# Patient Record
Sex: Male | Born: 1970 | Race: Black or African American | Hispanic: No | Marital: Single | Smoking: Current every day smoker
Health system: Southern US, Community
[De-identification: ages and names within clinical notes are randomized; demographics above are authoritative.]

## PROBLEM LIST (undated history)

## (undated) DIAGNOSIS — I5042 Chronic combined systolic (congestive) and diastolic (congestive) heart failure: Secondary | ICD-10-CM

## (undated) DIAGNOSIS — I10 Essential (primary) hypertension: Secondary | ICD-10-CM

## (undated) DIAGNOSIS — I2510 Atherosclerotic heart disease of native coronary artery without angina pectoris: Secondary | ICD-10-CM

## (undated) DIAGNOSIS — F1410 Cocaine abuse, uncomplicated: Secondary | ICD-10-CM

## (undated) DIAGNOSIS — Z95820 Peripheral vascular angioplasty status with implants and grafts: Secondary | ICD-10-CM

## (undated) DIAGNOSIS — F1110 Opioid abuse, uncomplicated: Secondary | ICD-10-CM

---

## 2001-03-05 ENCOUNTER — Emergency Department (HOSPITAL_COMMUNITY): Admission: EM | Admit: 2001-03-05 | Discharge: 2001-03-05 | Payer: Self-pay | Admitting: *Deleted

## 2001-07-09 ENCOUNTER — Encounter: Payer: Self-pay | Admitting: Emergency Medicine

## 2001-07-09 ENCOUNTER — Emergency Department (HOSPITAL_COMMUNITY): Admission: EM | Admit: 2001-07-09 | Discharge: 2001-07-09 | Payer: Self-pay | Admitting: Emergency Medicine

## 2002-01-11 ENCOUNTER — Emergency Department (HOSPITAL_COMMUNITY): Admission: EM | Admit: 2002-01-11 | Discharge: 2002-01-11 | Payer: Self-pay | Admitting: Emergency Medicine

## 2010-04-04 ENCOUNTER — Emergency Department (HOSPITAL_COMMUNITY): Admission: EM | Admit: 2010-04-04 | Discharge: 2010-04-04 | Payer: Self-pay | Admitting: Emergency Medicine

## 2020-10-12 ENCOUNTER — Inpatient Hospital Stay (HOSPITAL_COMMUNITY)
Admission: EM | Admit: 2020-10-12 | Discharge: 2020-10-15 | DRG: 981 | Disposition: A | Discharge status: Home / Self Care | Payer: Self-pay | Attending: Interventional Cardiology | Admitting: Interventional Cardiology

## 2020-10-12 ENCOUNTER — Emergency Department (HOSPITAL_COMMUNITY): Payer: Self-pay

## 2020-10-12 DIAGNOSIS — I25110 Atherosclerotic heart disease of native coronary artery with unstable angina pectoris: Secondary | ICD-10-CM

## 2020-10-12 DIAGNOSIS — I5041 Acute combined systolic (congestive) and diastolic (congestive) heart failure: Secondary | ICD-10-CM | POA: Diagnosis present

## 2020-10-12 DIAGNOSIS — Z881 Allergy status to other antibiotic agents status: Secondary | ICD-10-CM

## 2020-10-12 DIAGNOSIS — F1410 Cocaine abuse, uncomplicated: Secondary | ICD-10-CM | POA: Diagnosis present

## 2020-10-12 DIAGNOSIS — I110 Hypertensive heart disease with heart failure: Secondary | ICD-10-CM | POA: Diagnosis present

## 2020-10-12 DIAGNOSIS — Z955 Presence of coronary angioplasty implant and graft: Secondary | ICD-10-CM

## 2020-10-12 DIAGNOSIS — I2510 Atherosclerotic heart disease of native coronary artery without angina pectoris: Secondary | ICD-10-CM | POA: Diagnosis present

## 2020-10-12 DIAGNOSIS — Z720 Tobacco use: Secondary | ICD-10-CM

## 2020-10-12 DIAGNOSIS — I213 ST elevation (STEMI) myocardial infarction of unspecified site: Secondary | ICD-10-CM

## 2020-10-12 DIAGNOSIS — E785 Hyperlipidemia, unspecified: Secondary | ICD-10-CM | POA: Diagnosis present

## 2020-10-12 DIAGNOSIS — Z20822 Contact with and (suspected) exposure to covid-19: Secondary | ICD-10-CM | POA: Diagnosis present

## 2020-10-12 DIAGNOSIS — E7800 Pure hypercholesterolemia, unspecified: Secondary | ICD-10-CM | POA: Diagnosis present

## 2020-10-12 DIAGNOSIS — T405X1A Poisoning by cocaine, accidental (unintentional), initial encounter: Principal | ICD-10-CM | POA: Diagnosis present

## 2020-10-12 DIAGNOSIS — I2102 ST elevation (STEMI) myocardial infarction involving left anterior descending coronary artery: Secondary | ICD-10-CM | POA: Diagnosis present

## 2020-10-12 MED ORDER — ASPIRIN 81 MG PO CHEW
324.0000 mg | CHEWABLE_TABLET | Freq: Once | ORAL | Status: AC
  Administered 2020-10-13: 324 mg via ORAL
  Filled 2020-10-12: qty 4

## 2020-10-12 MED ORDER — SODIUM CHLORIDE 0.9 % IV SOLN: INTRAVENOUS | Status: DC

## 2020-10-12 MED ORDER — NITROGLYCERIN 0.4 MG SL SUBL
0.4000 mg | SUBLINGUAL_TABLET | SUBLINGUAL | Status: DC | PRN
  Administered 2020-10-13 (×2): 0.4 mg via SUBLINGUAL
  Filled 2020-10-12 (×2): qty 1

## 2020-10-13 ENCOUNTER — Other Ambulatory Visit: Payer: Self-pay

## 2020-10-13 ENCOUNTER — Inpatient Hospital Stay (HOSPITAL_COMMUNITY): Payer: Self-pay

## 2020-10-13 ENCOUNTER — Encounter (HOSPITAL_COMMUNITY): Payer: Self-pay | Admitting: Interventional Cardiology

## 2020-10-13 ENCOUNTER — Other Ambulatory Visit (HOSPITAL_COMMUNITY): Payer: Self-pay

## 2020-10-13 ENCOUNTER — Encounter (HOSPITAL_COMMUNITY)
Admission: EM | Disposition: A | Discharge status: Home / Self Care | Payer: Self-pay | Source: Home / Self Care | Attending: Interventional Cardiology

## 2020-10-13 DIAGNOSIS — I2102 ST elevation (STEMI) myocardial infarction involving left anterior descending coronary artery: Secondary | ICD-10-CM | POA: Diagnosis present

## 2020-10-13 DIAGNOSIS — I213 ST elevation (STEMI) myocardial infarction of unspecified site: Secondary | ICD-10-CM

## 2020-10-13 DIAGNOSIS — I5041 Acute combined systolic (congestive) and diastolic (congestive) heart failure: Secondary | ICD-10-CM

## 2020-10-13 DIAGNOSIS — I25110 Atherosclerotic heart disease of native coronary artery with unstable angina pectoris: Secondary | ICD-10-CM

## 2020-10-13 HISTORY — PX: LEFT HEART CATH AND CORONARY ANGIOGRAPHY: CATH118249

## 2020-10-13 HISTORY — DX: ST elevation (STEMI) myocardial infarction of unspecified site: I21.3

## 2020-10-13 HISTORY — PX: CORONARY/GRAFT ACUTE MI REVASCULARIZATION: CATH118305

## 2020-10-13 LAB — CBC
HCT: 36.6 % — ABNORMAL LOW (ref 39.0–52.0)
Hemoglobin: 12.1 g/dL — ABNORMAL LOW (ref 13.0–17.0)
MCH: 30.9 pg (ref 26.0–34.0)
MCHC: 33.1 g/dL (ref 30.0–36.0)
MCV: 93.6 fL (ref 80.0–100.0)
Platelets: 213 10*3/uL (ref 150–400)
RBC: 3.91 MIL/uL — ABNORMAL LOW (ref 4.22–5.81)
RDW: 12 % (ref 11.5–15.5)
WBC: 5.2 10*3/uL (ref 4.0–10.5)
nRBC: 0 % (ref 0.0–0.2)

## 2020-10-13 LAB — CBC WITH DIFFERENTIAL/PLATELET
Abs Immature Granulocytes: 0 10*3/uL (ref 0.00–0.07)
Basophils Absolute: 0 10*3/uL (ref 0.0–0.1)
Basophils Relative: 0 %
Eosinophils Absolute: 0.1 10*3/uL (ref 0.0–0.5)
Eosinophils Relative: 2 %
HCT: 41.1 % (ref 39.0–52.0)
Hemoglobin: 13.1 g/dL (ref 13.0–17.0)
Lymphocytes Relative: 34 %
Lymphs Abs: 1.4 10*3/uL (ref 0.7–4.0)
MCH: 30.5 pg (ref 26.0–34.0)
MCHC: 31.9 g/dL (ref 30.0–36.0)
MCV: 95.8 fL (ref 80.0–100.0)
Monocytes Absolute: 0.2 10*3/uL (ref 0.1–1.0)
Monocytes Relative: 6 %
Neutro Abs: 2.4 10*3/uL (ref 1.7–7.7)
Neutrophils Relative %: 58 %
Platelets: 231 10*3/uL (ref 150–400)
RBC: 4.29 MIL/uL (ref 4.22–5.81)
RDW: 12 % (ref 11.5–15.5)
WBC: 4.1 10*3/uL (ref 4.0–10.5)
nRBC: 0 % (ref 0.0–0.2)
nRBC: 0 /100 WBC

## 2020-10-13 LAB — POCT ACTIVATED CLOTTING TIME
Activated Clotting Time: 260 seconds
Activated Clotting Time: 439 seconds

## 2020-10-13 LAB — BASIC METABOLIC PANEL
Anion gap: 6 (ref 5–15)
BUN: 10 mg/dL (ref 6–20)
CO2: 28 mmol/L (ref 22–32)
Calcium: 8.8 mg/dL — ABNORMAL LOW (ref 8.9–10.3)
Chloride: 102 mmol/L (ref 98–111)
Creatinine, Ser: 0.74 mg/dL (ref 0.61–1.24)
GFR, Estimated: >60 mL/min (ref >60)
Glucose, Bld: 118 mg/dL — ABNORMAL HIGH (ref 70–99)
Potassium: 3.4 mmol/L — ABNORMAL LOW (ref 3.5–5.1)
Sodium: 136 mmol/L (ref 135–145)

## 2020-10-13 LAB — COMPREHENSIVE METABOLIC PANEL
ALT: 12 U/L (ref 0–44)
AST: 40 U/L (ref 15–41)
Albumin: 3.7 g/dL (ref 3.5–5.0)
Alkaline Phosphatase: 56 U/L (ref 38–126)
Anion gap: 10 (ref 5–15)
BUN: 13 mg/dL (ref 6–20)
CO2: 26 mmol/L (ref 22–32)
Calcium: 9 mg/dL (ref 8.9–10.3)
Chloride: 103 mmol/L (ref 98–111)
Creatinine, Ser: 0.74 mg/dL (ref 0.61–1.24)
GFR, Estimated: >60 mL/min (ref >60)
Glucose, Bld: 97 mg/dL (ref 70–99)
Potassium: 3.6 mmol/L (ref 3.5–5.1)
Sodium: 139 mmol/L (ref 135–145)
Total Bilirubin: 0.5 mg/dL (ref 0.3–1.2)
Total Protein: 7 g/dL (ref 6.5–8.1)

## 2020-10-13 LAB — PROTIME-INR
INR: 0.9 (ref 0.8–1.2)
Prothrombin Time: 11.8 seconds (ref 11.4–15.2)

## 2020-10-13 LAB — HEMOGLOBIN A1C
Hgb A1c MFr Bld: 5.8 % — ABNORMAL HIGH (ref 4.8–5.6)
Mean Plasma Glucose: 120 mg/dL

## 2020-10-13 LAB — ETHANOL: Alcohol, Ethyl (B): <10 mg/dL (ref <10)

## 2020-10-13 LAB — BRAIN NATRIURETIC PEPTIDE: B Natriuretic Peptide: 139.1 pg/mL — ABNORMAL HIGH (ref 0.0–100.0)

## 2020-10-13 LAB — TROPONIN I (HIGH SENSITIVITY)
Troponin I (High Sensitivity): 2817 ng/L (ref <18)
Troponin I (High Sensitivity): >24000 ng/L (ref <18)
Troponin I (High Sensitivity): >24000 ng/L (ref <18)
Troponin I (High Sensitivity): >24000 ng/L (ref <18)
Troponin I (High Sensitivity): >24000 ng/L (ref <18)

## 2020-10-13 LAB — ECHOCARDIOGRAM COMPLETE
Area-P 1/2: 2.93 cm2
S' Lateral: 2.8 cm

## 2020-10-13 LAB — LIPID PANEL
Cholesterol: 159 mg/dL (ref 0–200)
HDL: 44 mg/dL (ref >40)
LDL Cholesterol: 98 mg/dL (ref 0–99)
Total CHOL/HDL Ratio: 3.6 RATIO
Triglycerides: 87 mg/dL (ref <150)
VLDL: 17 mg/dL (ref 0–40)

## 2020-10-13 LAB — RESP PANEL BY RT-PCR (FLU A&B, COVID) ARPGX2
Influenza A by PCR: NEGATIVE
Influenza B by PCR: NEGATIVE
SARS Coronavirus 2 by RT PCR: NEGATIVE

## 2020-10-13 LAB — HEPARIN LEVEL (UNFRACTIONATED): Heparin Unfractionated: <0.1 IU/mL — ABNORMAL LOW (ref 0.30–0.70)

## 2020-10-13 LAB — MRSA PCR SCREENING: MRSA by PCR: NEGATIVE

## 2020-10-13 LAB — APTT: aPTT: 27 seconds (ref 24–36)

## 2020-10-13 LAB — GLUCOSE, CAPILLARY: Glucose-Capillary: 105 mg/dL — ABNORMAL HIGH (ref 70–99)

## 2020-10-13 SURGERY — CORONARY/GRAFT ACUTE MI REVASCULARIZATION
Anesthesia: LOCAL

## 2020-10-13 MED ORDER — TIROFIBAN HCL IN NACL 5-0.9 MG/100ML-% IV SOLN
INTRAVENOUS | Status: AC
  Filled 2020-10-13: qty 100

## 2020-10-13 MED ORDER — HEPARIN (PORCINE) 25000 UT/250ML-% IV SOLN
1350.0000 [IU]/h | INTRAVENOUS | Status: DC
  Administered 2020-10-13: 1000 [IU]/h via INTRAVENOUS
  Filled 2020-10-13: qty 250

## 2020-10-13 MED ORDER — ONDANSETRON HCL 4 MG/2ML IJ SOLN: 4.0000 mg | Freq: Four times a day (QID) | INTRAMUSCULAR | Status: DC | PRN

## 2020-10-13 MED ORDER — HEPARIN SODIUM (PORCINE) 1000 UNIT/ML IJ SOLN
INTRAMUSCULAR | Status: AC
  Filled 2020-10-13: qty 1

## 2020-10-13 MED ORDER — HEPARIN SODIUM (PORCINE) 5000 UNIT/ML IJ SOLN: 4000.0000 [IU] | Freq: Once | INTRAMUSCULAR | Status: DC

## 2020-10-13 MED ORDER — NITROGLYCERIN IN D5W 200-5 MCG/ML-% IV SOLN
20.0000 ug/min | INTRAVENOUS | Status: DC
  Administered 2020-10-13: 20 ug/min via INTRAVENOUS
  Filled 2020-10-13: qty 250

## 2020-10-13 MED ORDER — NITROGLYCERIN 1 MG/10 ML FOR IR/CATH LAB
INTRA_ARTERIAL | Status: AC
  Filled 2020-10-13: qty 10

## 2020-10-13 MED ORDER — CHLORHEXIDINE GLUCONATE CLOTH 2 % EX PADS
6.0000 | MEDICATED_PAD | Freq: Every day | CUTANEOUS | Status: DC
  Administered 2020-10-14: 6 via TOPICAL

## 2020-10-13 MED ORDER — ATORVASTATIN CALCIUM 80 MG PO TABS
80.0000 mg | ORAL_TABLET | Freq: Every day | ORAL | Status: DC
  Administered 2020-10-13 – 2020-10-15 (×3): 80 mg via ORAL
  Filled 2020-10-13 (×3): qty 1

## 2020-10-13 MED ORDER — FUROSEMIDE 10 MG/ML IJ SOLN
20.0000 mg | Freq: Once | INTRAMUSCULAR | Status: AC
  Administered 2020-10-13: 20 mg via INTRAVENOUS
  Filled 2020-10-13: qty 2

## 2020-10-13 MED ORDER — OXYCODONE HCL 5 MG PO TABS
5.0000 mg | ORAL_TABLET | ORAL | Status: DC | PRN
  Administered 2020-10-14 – 2020-10-15 (×3): 5 mg via ORAL
  Filled 2020-10-13 (×3): qty 1

## 2020-10-13 MED ORDER — NITROGLYCERIN 1 MG/10 ML FOR IR/CATH LAB
INTRA_ARTERIAL | Status: DC | PRN
  Administered 2020-10-13: 200 ug via INTRACORONARY

## 2020-10-13 MED ORDER — TICAGRELOR 90 MG PO TABS
90.0000 mg | ORAL_TABLET | Freq: Two times a day (BID) | ORAL | Status: DC
  Administered 2020-10-13 (×2): 90 mg via ORAL
  Filled 2020-10-13 (×2): qty 1

## 2020-10-13 MED ORDER — VERAPAMIL HCL 2.5 MG/ML IV SOLN
INTRAVENOUS | Status: AC
  Filled 2020-10-13: qty 2

## 2020-10-13 MED ORDER — HEPARIN (PORCINE) IN NACL 1000-0.9 UT/500ML-% IV SOLN
INTRAVENOUS | Status: AC
  Filled 2020-10-13: qty 1500

## 2020-10-13 MED ORDER — LOSARTAN POTASSIUM 25 MG PO TABS
25.0000 mg | ORAL_TABLET | Freq: Every day | ORAL | Status: DC
  Administered 2020-10-13: 25 mg via ORAL
  Filled 2020-10-13: qty 1

## 2020-10-13 MED ORDER — SODIUM CHLORIDE 0.9 % IV SOLN: 250.0000 mL | INTRAVENOUS | Status: DC | PRN

## 2020-10-13 MED ORDER — ASPIRIN 81 MG PO CHEW
81.0000 mg | CHEWABLE_TABLET | Freq: Every day | ORAL | Status: DC
  Administered 2020-10-13 – 2020-10-15 (×3): 81 mg via ORAL
  Filled 2020-10-13 (×3): qty 1

## 2020-10-13 MED ORDER — HEPARIN SODIUM (PORCINE) 1000 UNIT/ML IJ SOLN
INTRAMUSCULAR | Status: DC | PRN
  Administered 2020-10-13: 7000 [IU] via INTRAVENOUS
  Administered 2020-10-13: 3000 [IU] via INTRAVENOUS

## 2020-10-13 MED ORDER — TICAGRELOR 90 MG PO TABS
ORAL_TABLET | ORAL | Status: DC | PRN
  Administered 2020-10-13: 180 mg via ORAL

## 2020-10-13 MED ORDER — TIROFIBAN (AGGRASTAT) BOLUS VIA INFUSION
INTRAVENOUS | Status: DC | PRN
  Administered 2020-10-13: 1925 ug via INTRAVENOUS

## 2020-10-13 MED ORDER — LIDOCAINE HCL (PF) 1 % IJ SOLN
INTRAMUSCULAR | Status: AC
  Filled 2020-10-13: qty 30

## 2020-10-13 MED ORDER — HYDRALAZINE HCL 20 MG/ML IJ SOLN: 10.0000 mg | INTRAMUSCULAR | Status: AC | PRN

## 2020-10-13 MED ORDER — FENTANYL CITRATE (PF) 100 MCG/2ML IJ SOLN
INTRAMUSCULAR | Status: DC | PRN
  Administered 2020-10-13: 25 ug via INTRAVENOUS

## 2020-10-13 MED ORDER — ACETAMINOPHEN 325 MG PO TABS: 650.0000 mg | ORAL_TABLET | ORAL | Status: DC | PRN

## 2020-10-13 MED ORDER — TICAGRELOR 90 MG PO TABS
ORAL_TABLET | ORAL | Status: AC
  Filled 2020-10-13: qty 2

## 2020-10-13 MED ORDER — TIROFIBAN HCL IN NACL 5-0.9 MG/100ML-% IV SOLN
INTRAVENOUS | Status: DC | PRN
  Administered 2020-10-13: 0.15 ug/kg/min via INTRAVENOUS

## 2020-10-13 MED ORDER — MIDAZOLAM HCL 2 MG/2ML IJ SOLN
INTRAMUSCULAR | Status: AC
  Filled 2020-10-13: qty 2

## 2020-10-13 MED ORDER — SODIUM CHLORIDE 0.9 % IV SOLN: INTRAVENOUS | Status: AC

## 2020-10-13 MED ORDER — LIDOCAINE HCL (PF) 1 % IJ SOLN
INTRAMUSCULAR | Status: DC | PRN
  Administered 2020-10-13: 2 mL via INTRADERMAL

## 2020-10-13 MED ORDER — PERFLUTREN LIPID MICROSPHERE
1.0000 mL | INTRAVENOUS | Status: AC | PRN
  Administered 2020-10-13: 3 mL via INTRAVENOUS
  Filled 2020-10-13: qty 10

## 2020-10-13 MED ORDER — POTASSIUM CHLORIDE CRYS ER 20 MEQ PO TBCR
40.0000 meq | EXTENDED_RELEASE_TABLET | Freq: Once | ORAL | Status: AC
  Administered 2020-10-13: 40 meq via ORAL
  Filled 2020-10-13: qty 2

## 2020-10-13 MED ORDER — HEPARIN (PORCINE) IN NACL 1000-0.9 UT/500ML-% IV SOLN
INTRAVENOUS | Status: DC | PRN
  Administered 2020-10-13 (×3): 500 mL

## 2020-10-13 MED ORDER — MIDAZOLAM HCL 2 MG/2ML IJ SOLN
INTRAMUSCULAR | Status: DC | PRN
  Administered 2020-10-13: 0.5 mg via INTRAVENOUS

## 2020-10-13 MED ORDER — LABETALOL HCL 5 MG/ML IV SOLN: 10.0000 mg | INTRAVENOUS | Status: AC | PRN

## 2020-10-13 MED ORDER — FUROSEMIDE 10 MG/ML IJ SOLN
INTRAMUSCULAR | Status: AC
  Filled 2020-10-13: qty 2

## 2020-10-13 MED ORDER — VERAPAMIL HCL 2.5 MG/ML IV SOLN
INTRAVENOUS | Status: DC | PRN
  Administered 2020-10-13: 10 mL via INTRA_ARTERIAL

## 2020-10-13 MED ORDER — FENTANYL CITRATE (PF) 100 MCG/2ML IJ SOLN
INTRAMUSCULAR | Status: AC
  Filled 2020-10-13: qty 2

## 2020-10-13 MED ORDER — SODIUM CHLORIDE 0.9% FLUSH
3.0000 mL | Freq: Two times a day (BID) | INTRAVENOUS | Status: DC
  Administered 2020-10-13 – 2020-10-15 (×4): 3 mL via INTRAVENOUS

## 2020-10-13 MED ORDER — SODIUM CHLORIDE 0.9% FLUSH: 3.0000 mL | INTRAVENOUS | Status: DC | PRN

## 2020-10-13 MED ORDER — CARVEDILOL 3.125 MG PO TABS
3.1250 mg | ORAL_TABLET | Freq: Two times a day (BID) | ORAL | Status: DC
  Administered 2020-10-13 (×2): 3.125 mg via ORAL
  Filled 2020-10-13 (×2): qty 1

## 2020-10-13 SURGICAL SUPPLY — 18 items
BALLN SAPPHIRE 2.5X15 (BALLOONS) ×2
BALLN SAPPHIRE ~~LOC~~ 3.25X8 (BALLOONS) ×1 IMPLANT
BALLN SAPPHIRE ~~LOC~~ 3.5X15 (BALLOONS) ×1 IMPLANT
BALLOON SAPPHIRE 2.5X15 (BALLOONS) IMPLANT
CATH 5FR JL3.5 JR4 ANG PIG MP (CATHETERS) ×1 IMPLANT
CATH VISTA GUIDE 6FR XBLAD3.5 (CATHETERS) ×1 IMPLANT
DEVICE RAD COMP TR BAND LRG (VASCULAR PRODUCTS) ×1 IMPLANT
GLIDESHEATH SLEND A-KIT 6F 22G (SHEATH) ×1 IMPLANT
GUIDEWIRE INQWIRE 1.5J.035X260 (WIRE) IMPLANT
INQWIRE 1.5J .035X260CM (WIRE) ×2
KIT ENCORE 26 ADVANTAGE (KITS) ×1 IMPLANT
KIT HEART LEFT (KITS) ×2 IMPLANT
PACK CARDIAC CATHETERIZATION (CUSTOM PROCEDURE TRAY) ×2 IMPLANT
SHEATH PROBE COVER 6X72 (BAG) ×1 IMPLANT
STENT RESOLUTE ONYX 2.75X18 (Permanent Stent) ×1 IMPLANT
TRANSDUCER W/STOPCOCK (MISCELLANEOUS) ×2 IMPLANT
TUBING CIL FLEX 10 FLL-RA (TUBING) ×2 IMPLANT
WIRE ASAHI PROWATER 180CM (WIRE) ×1 IMPLANT

## 2020-10-13 NOTE — Plan of Care (Signed)

## 2020-10-13 NOTE — Progress Notes (Signed)
   10/13/20 0000  Clinical Encounter Type  Visited With Patient not available  Visit Type Trauma  Referral From Nurse  Consult/Referral To Chaplain  The chaplain responded to page. The patient is being assessed in Trauma A. The patient has no family present. No chaplain services are needed at this time. The chaplain will follow up as needed.

## 2020-10-13 NOTE — H&P (Signed)
INTERVENTIONAL CARDIOLOGY NOTE  50 year old gentleman with chest and back pain since earlier this morning, greater than 18 hours prior to admission.  Associated history of cocaine use.  No prior history of MI.  Ongoing 9 out of 10 pain on presentation.  Prior smoker.  Not diabetic.  No prior history of coronary disease.  Denies hypertension and is unaware of lipid status.  On exam the patient is lying in bed with a flat affect, eyes closed, and is uncommunicative until vigorously stimulated by loud vocal interrogation.  Responses are short.  JVD is not noted.  Cardiac exam reveals an S4 gallop.  Lungs are clear to auscultation and percussion.  Pulses are symmetrical at the carotids, radials, femorals, and posterior tibials bilaterally.  The abdomen is soft.  As mentioned above the neuro exam is flat affect but no focal deficits.  EKG abnormal with Q waves V3 through V5, I, and aVL with minimal inferior lead ST depression.  Right axis deviation is noted.  Chest x-ray reveals atelectasis but no evidence of failure.  CONCLUSIONS: Acute coronary syndrome and probable late presenting myocardial infarction with chest pain continuous since early this morning, greater than 18 hours ago.  No evidence of hemodynamic compromise or heart failure.  Cocaine use prior to onset of chest discomfort.  RECOMMENDATIONS: The patient presented as a "STEMI activation".  He does have ongoing chest discomfort.  EKG suggests Q wave infarction of uncertain age involving the anterolateral left ventricle.  Emergency catheterization is being performed.  EKG does not demonstrate STEMI criteria but ongoing chest pain and evidence of Q wave infarction is concerning.  Coronary angiography was discussed with the patient including approach, risks of stroke, death, myocardial infarction, emergency surgery, bleeding, limb ischemia, kidney injury.  Critical care time 45 minutes

## 2020-10-13 NOTE — Progress Notes (Addendum)
Heart Failure Stewardship Pharmacist Progress Note   PCP: No primary care provider on file. PCP-Cardiologist: None    HPI:  72 yoM w/ PMH of cocaine and tobacco use presenting late w/ CP on 6/8, evaluated for STEMI. Cath revealed 100% occlusion of LAD, DES placed, EF estimated 30-35% in cath, LVEDP 24. ECHO revealed EF 42% with G1DD and normal RV systolic function.  Current HF Medications: - Losartan 25 mg daily - Carvedilol 3.125 mg BID - Lasix 20 mg IV x1  Prior to admission HF Medications: - N/A  Pertinent Lab Values: Serum creatinine 0.74, BUN 10, Potassium 3.4, Sodium 136, BNP 139  Vital Signs: Weight: N/A Blood pressure: 140/90s  Heart rate: 60-70s  Medication Assistance / Insurance Benefits Check: Does the patient have prescription insurance?  No  Does the patient qualify for medication assistance through manufacturers or grants?   Yes Eligible grants and/or patient assistance programs: pending Medication assistance applications in progress: N/A  Medication assistance applications approved: N/A Approved medication assistance renewals will be completed by: pending  Outpatient Pharmacy:  Prior to admission outpatient pharmacy: [facility] Is the patient willing to use [facility] TOC pharmacy at discharge? Yes Is the patient willing to transition their outpatient pharmacy to utilize a [facility] outpatient pharmacy?   Pending    Assessment: 1. New onset CHF (EF 42%), due to ischemic CM (potential hx cocaine/tobacco use). NYHA class II symptoms. - Continue carvedilol 3.125mg  BID - Continue losartan 25mg  daily, w/ elevated BP consider optimizing to Entresto - Consider adding spironolactone - Consider adding Farxiga 10 mg daily prior to discharge   Plan: 1) Medication changes recommended at this time: - Optimize losartan to Entresto or add spironolactone tomorrow  2) Patient assistance: - No insurance - can utilize free 30 day cards at [facility] pharmacy and then can complete  long-term patient assistance applications for [place], Brilinta, and Farxiga at HF TOC follow up. This will allow copays to be $0 per month for each medication once approved. - HF TOC appt 6/20  3)  Education  - To be completed prior to discharge  7/20 Josie Dixon) Caydan Mctavish, PharmD Student

## 2020-10-13 NOTE — Progress Notes (Signed)
ANTICOAGULATION CONSULT NOTE - Initial Consult  Pharmacy Consult for Heparin  Indication: chest pain/ACS, s/p cath, high suspicion for apical thrombus  Allergies  Allergen Reactions  . Keflex [Cephalexin]       Vital Signs: Temp: 99.4 F (37.4 C) (06/08 1603) Temp Source: Oral (06/08 1603) BP: 134/94 (06/08 1800) Pulse Rate: 66 (06/08 1000)  Labs: Recent Labs    10/12/20 2348 10/13/20 0315 10/13/20 0653 10/13/20 0956 10/13/20 1658  HGB 13.1 12.1*  --   --   --   HCT 41.1 36.6*  --   --   --   PLT 231 213  --   --   --   APTT 27  --   --   --   --   LABPROT 11.8  --   --   --   --   INR 0.9  --   --   --   --   HEPARINUNFRC  --   --   --   --  <0.10*  CREATININE 0.74 0.74  --   --   --   TROPONINIHS 2,817* >24,000*  >24,000* >24,000* >24,000*  --     CrCl cannot be calculated (Unknown ideal weight.).   Medical History: History reviewed. No pertinent past medical history.   Assessment: 50 y/o M CODE STEMI s/p stenting of the mid LAD. High suspicion for apical thrombus. Starting heparin 8 hours after sheath removal. Sheath removed at 0142. CBC good. Renal function good.   Heparin level this evening undetectable on 1000 units/hr.  No overt bleeding or complications noted.  Goal of Therapy:  Heparin level 0.3-0.7 units/ml Monitor platelets by anticoagulation protocol: Yes   Plan:  Increase IV heparin to 1200 units/hr. Repeat heparin level in 8 hrs. Daily CBC/heparin level Monitor for bleeding F/U ECHO   Reece Leader, Colon Flattery, [facility] Clinical Pharmacist  10/13/2020 7:11 PM   [facility] pharmacy phone numbers are listed on [email or web address]

## 2020-10-13 NOTE — Progress Notes (Signed)
ANTICOAGULATION CONSULT NOTE - Initial Consult  Pharmacy Consult for Heparin  Indication: chest pain/ACS, s/p cath, high suspicion for apical thrombus  Allergies  Allergen Reactions  . Keflex [Cephalexin]       Vital Signs: Temp: 99.2 F (37.3 C) (06/08 0220) Temp Source: Oral (06/08 0220) BP: 145/82 (06/08 0330) Pulse Rate: 67 (06/08 0330)  Labs: Recent Labs    10/12/20 2348 10/13/20 0315  HGB 13.1 12.1*  HCT 41.1 36.6*  PLT 231 213  APTT 27  --   LABPROT 11.8  --   INR 0.9  --   CREATININE 0.74  --   TROPONINIHS 2,817*  --     CrCl cannot be calculated (Unknown ideal weight.).   Medical History: No past medical history on file.   Assessment: 50 y/o M CODE STEMI s/p stenting of the mid LAD. High suspicion for apical thrombus. Starting heparin 8 hours after sheath removal. Sheath removed at 0142. CBC good. Renal function good.   Goal of Therapy:  Heparin level 0.3-0.7 units/ml Monitor platelets by anticoagulation protocol: Yes   Plan:  No bolus s/p cath Start heparin drip at 1000 units/hr at 0930 1700 heparin level Daily CBC/heparin level Monitor for bleeding F/U ECHO  Abran Duke, PharmD, BCPS Clinical Pharmacist Phone: [phone]

## 2020-10-13 NOTE — TOC Benefit Eligibility Note (Signed)
Patient Advocate Encounter  Insurance verification completed.    The patient is uninsured  Jewelle Whitner, CPhT Pharmacy Patient Advocate Specialist [place] Antimicrobial Stewardship Team Direct Number: [phone]  Fax: [phone]        

## 2020-10-13 NOTE — Progress Notes (Signed)
Progress Note  Patient Name: Christopher Gibson Date of Encounter: 10/13/2020  [facility] Cardiologist: None new- Verdis Prime MD  Subjective   Patient reports feeling better. No chest pain or SOB. Not very communicative.   Inpatient Medications    Scheduled Meds: . aspirin  81 mg Oral Daily  . atorvastatin  80 mg Oral Daily  . sodium chloride flush  3 mL Intravenous Q12H  . ticagrelor  90 mg Oral BID   Continuous Infusions: . sodium chloride    . heparin    . nitroGLYCERIN Stopped (10/13/20 0408)   PRN Meds: sodium chloride, acetaminophen, nitroGLYCERIN, ondansetron (ZOFRAN) IV, oxyCODONE, sodium chloride flush   Vital Signs    Vitals:   10/13/20 0700 10/13/20 0715 10/13/20 0730 10/13/20 0738  BP: (!) 151/94 (!) 133/91 (!) 137/91   Pulse: 70 68 64   Resp: 14 (!) 22 (!) 24   Temp:    99.6 F (37.6 C)  TempSrc:    Oral  SpO2: 100% 99% 99%     Intake/Output Summary (Last 24 hours) at 10/13/2020 0915 Last data filed at 10/13/2020 0500 Gross per 24 hour  Intake 450.23 ml  Output 600 ml  Net -149.77 ml   No flowsheet data found.    Telemetry    NSR with PVCs, occ triplets - Personally Reviewed  ECG    NSR, anterior infarct with Q waves V1-5 and T wave inversion - Personally Reviewed  Physical Exam   GEN: No acute distress. Depressed affect.  Multiple tattoos. Neck: No JVD Cardiac: RRR, no murmurs, rubs, or gallops.  Respiratory: Clear to auscultation bilaterally. GI: Soft, nontender, non-distended  MS: No edema; No deformity. No radial site hematoma Neuro:  Nonfocal  Psych: Normal affect   Labs    High Sensitivity Troponin:   Recent Labs  Lab 10/12/20 2348 10/13/20 0315 10/13/20 0653  TROPONINIHS 2,817* >24,000*  >24,000* >24,000*      Chemistry Recent Labs  Lab 10/12/20 2348 10/13/20 0315  NA 139 136  K 3.6 3.4*  CL 103 102  CO2 26 28  GLUCOSE 97 118*  BUN 13 10  CREATININE 0.74 0.74  CALCIUM 9.0 8.8*  PROT 7.0  --   ALBUMIN 3.7   --   AST 40  --   ALT 12  --   ALKPHOS 56  --   BILITOT 0.5  --   GFRNONAA >60 >60  ANIONGAP 10 6     Hematology Recent Labs  Lab 10/12/20 2348 10/13/20 0315  WBC 4.1 5.2  RBC 4.29 3.91*  HGB 13.1 12.1*  HCT 41.1 36.6*  MCV 95.8 93.6  MCH 30.5 30.9  MCHC 31.9 33.1  RDW 12.0 12.0  PLT 231 213    BNP Recent Labs  Lab 10/13/20 0315  BNP 139.1*     DDimer No results for input(s): DDIMER in the last 168 hours.   Radiology    CARDIAC CATHETERIZATION  Result Date: 10/13/2020  A stent was successfully placed.   Late presenting anterior infarction due to occlusion of the mid LAD.  Successful stenting of the mid LAD using an 18 x 2.5 Onyx postdilated to 3.5 mm in diameter with TIMI grade III flow.  Widely patent left main  Diffuse luminal irregularities in the proximal and mid LAD  Diffuse mild to moderate nonobstructive atherosclerosis in the circumflex.  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  EF 30  to 35%.  LVEDP 24 mmHg.  Acute systolic heart failure based upon hemodynamics and wall motion. RECOMMENDATIONS:  Aggressive risk factor modification with high intensity statin therapy, screening for diabetes, blood pressure control.  IV nitroglycerin for blood pressure control and to decrease LVEDP.  She did run for 12 to 24 hours  Start ARB and beta-blocker therapy as tolerated by blood pressure and clinical status  2D Doppler echocardiogram to assess for apical thrombus.  IV heparin will be restarted because of high index of suspicion of apical thrombus.  Further management per team   DG Chest Port 1 View  Result Date: 10/13/2020 CLINICAL DATA:  Chest pain. EXAM: PORTABLE CHEST 1 VIEW COMPARISON:  None. FINDINGS: The heart size and mediastinal contours are within normal limits. Streaky airspace opacity within the left lung likely represents atelectasis. No focal  consolidation. No pulmonary edema. No pleural effusion. No pneumothorax. No acute osseous abnormality. IMPRESSION: 1. Streaky airspace opacity within the left lung likely represents atelectasis. Followup PA and lateral chest X-ray is recommended in 3-4 weeks to ensure resolution. 2. Otherwise no acute cardiopulmonary abnormality. Electronically Signed   By: Tish Frederickson M.D.   On: 10/13/2020 00:21    Cardiac Studies   LEFT HEART CATH AND CORONARY ANGIOGRAPHY    Conclusion    A stent was successfully placed.    Late presenting anterior infarction due to occlusion of the mid LAD.  Successful stenting of the mid LAD using an 18 x 2.5 Onyx postdilated to 3.5 mm in diameter with TIMI grade III flow.  Widely patent left main  Diffuse luminal irregularities in the proximal and mid LAD  Diffuse mild to moderate nonobstructive atherosclerosis in the circumflex.  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  EF 30 to 35%.  LVEDP 24 mmHg.  Acute systolic heart failure based upon hemodynamics and wall motion.  RECOMMENDATIONS:   Aggressive risk factor modification with high intensity statin therapy, screening for diabetes, blood pressure control.  IV nitroglycerin for blood pressure control and to decrease LVEDP.  She did run for 12 to 24 hours  Start ARB and beta-blocker therapy as tolerated by blood pressure and clinical status  2D Doppler echocardiogram to assess for apical thrombus.  IV heparin will be restarted because of high index of suspicion of apical thrombus.  Further management per team  Diagnostic Dominance: Right    Intervention     Implants       Patient Profile     50 y.o. male with history of active cocaine and tobacco abuse presents late in the course of acute anterior STEMI  Assessment & Plan    1. Acute anterior STEMI with late  presentation. Onset of chest pain 18 hours prior to presentation. Q waves anteriorly. Troponin > 24K. On cath has anteroapical dyskinesis.  -s/p DES of mid LAD -DAPT for 1 year (Aspirin and Ticag) -high doseAtorvastatin -Needs Echo- assess for possible LV thrombus. If no thrombus will change heparin to DVT prophylaxis.  -Cocaine and smoking cessation -Start Coreg and ACE-I today.   2. Acute systolic CHF. -LVEDP 24- will give IV lasix x 1. -check Echo.  -start Coreg and ARB.  -Aggressive risk factor management  3. Tobacco abuse. Counseled on importance of cessation.   4. Cocaine abuse.Counseled on importance of cessation.   5. Hypercholesterolemia. LDL 98. On high dose statin  For questions or updates, please contact [facility] Please consult  [email or web address] for contact info under        Signed, Jocilyn Trego [place], MD  10/13/2020, 9:15 AM

## 2020-10-13 NOTE — H&P (Signed)
Cardiology Admission History and Physical:   Patient ID: Christopher Gibson MRN: [id number]; DOB: 1970/10/31   Admission date: 10/12/2020  PCP:  No primary care provider on file.   [facility] Providers Cardiologist:  None        Chief Complaint:  Chest pain  Patient Profile:   Christopher Gibson is a 50 y.o. male with no significant pmh sx who is being seen 10/13/2020 for the evaluation of STEMI  History of Present Illness:   Christopher Gibson is a 50 y.o. male with no significant pmh sx who is being seen 10/13/2020 for the evaluation of STEMI. Has history of cocaine abuse. Did some cocaine this morning after which he had chest pressure and pain. He waited it out; then ultimately came to the ED this evening. EKG was concerning for STEMI; chest pain was ongoing. Hence STEMI was activated from the ED. No hx of DM or HTN. Troponin were very high in the ED. Mild SOB. Smokes regularly. Nitro and Aspirin were given in the ED>   PMH: None PSH: None  Medications Prior to Admission: Prior to Admission medications   Not on File     Allergies:    Allergies  Allergen Reactions  . Keflex [Cephalexin]     Social History:   Social History   Socioeconomic History  . Marital status: Single    Spouse name: Not on file  . Number of children: Not on file  . Years of education: Not on file  . Highest education level: Not on file  Occupational History  . Not on file  Tobacco Use  . Smoking status: Not on file  . Smokeless tobacco: Not on file  Substance and Sexual Activity  . Alcohol use: Not on file  . Drug use: Not on file  . Sexual activity: Not on file  Other Topics Concern  . Not on file  Social History Narrative  . Not on file   Social Determinants of Health   Financial Resource Strain: Not on file  Food Insecurity: Not on file  Transportation Needs: Not on file  Physical Activity: Not on file  Stress: Not on file  Social Connections: Not on file  Intimate Partner Violence: Not on  file    Family History:   No sudden cardiac deaths in family  ROS:  Please see the history of present illness.  All other ROS reviewed and negative.     Physical Exam/Data:   Vitals:   10/13/20 0245 10/13/20 0300 10/13/20 0315 10/13/20 0330  BP: (!) 161/93 (!) 134/100 134/86 (!) 145/82  Pulse: 94 70 63 67  Resp: (!) 21 13 19 18   Temp:      TempSrc:      SpO2: 97% 98% 99% 97%    Intake/Output Summary (Last 24 hours) at 10/13/2020 0342 Last data filed at 10/13/2020 0300 Gross per 24 hour  Intake 37.37 ml  Output 300 ml  Net -262.63 ml   No flowsheet data found.   There is no height or weight on file to calculate BMI.  General:  Mild distress due to CP HEENT: normal Lymph: no adenopathy Neck: no JVD Endocrine:  No thryomegaly Vascular: No carotid bruits; FA pulses 2+ bilaterally without bruits  Cardiac:  normal S1, S2; RRR; no murmur. S4 present Lungs:  clear to auscultation bilaterally, no wheezing, rhonchi or rales  Abd: soft, nontender, no hepatomegaly  Ext: no edema Musculoskeletal:  No deformities, BUE and BLE strength normal and equal Skin:  warm and dry  Neuro:  CNs 2-12 intact, no focal abnormalities noted Psych:  Normal affect   EKG:  The ECG that was done  was personally reviewed and demonstrates Q waves and ST changes in I, AVL  Relevant CV Studies: None  Laboratory Data:  High Sensitivity Troponin:   Recent Labs  Lab 10/12/20 2348  TROPONINIHS 2,817*      Chemistry Recent Labs  Lab 10/12/20 2348  NA 139  K 3.6  CL 103  CO2 26  GLUCOSE 97  BUN 13  CREATININE 0.74  CALCIUM 9.0  GFRNONAA >60  ANIONGAP 10    Recent Labs  Lab 10/12/20 2348  PROT 7.0  ALBUMIN 3.7  AST 40  ALT 12  ALKPHOS 56  BILITOT 0.5   Hematology Recent Labs  Lab 10/12/20 2348 10/13/20 0315  WBC 4.1 5.2  RBC 4.29 3.91*  HGB 13.1 12.1*  HCT 41.1 36.6*  MCV 95.8 93.6  MCH 30.5 30.9  MCHC 31.9 33.1  RDW 12.0 12.0  PLT 231 213   BNPNo results for  input(s): BNP, PROBNP in the last 168 hours.  DDimer No results for input(s): DDIMER in the last 168 hours.   Radiology/Studies:  CARDIAC CATHETERIZATION  Result Date: 10/13/2020  A stent was successfully placed.   Late presenting anterior infarction due to occlusion of the mid LAD.  Successful stenting of the mid LAD using an 18 x 2.5 Onyx postdilated to 3.5 mm in diameter with TIMI grade III flow.  Widely patent left main  Diffuse luminal irregularities in the proximal and mid LAD  Diffuse mild to moderate nonobstructive atherosclerosis in the circumflex.  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  EF 30 to 35%.  LVEDP 24 mmHg.  Acute systolic heart failure based upon hemodynamics and wall motion. RECOMMENDATIONS:  Aggressive risk factor modification with high intensity statin therapy, screening for diabetes, blood pressure control.  IV nitroglycerin for blood pressure control and to decrease LVEDP.  She did run for 12 to 24 hours  Start ARB and beta-blocker therapy as tolerated by blood pressure and clinical status  2D Doppler echocardiogram to assess for apical thrombus.  IV heparin will be restarted because of high index of suspicion of apical thrombus.  Further management per team   DG Chest Port 1 View  Result Date: 10/13/2020 CLINICAL DATA:  Chest pain. EXAM: PORTABLE CHEST 1 VIEW COMPARISON:  None. FINDINGS: The heart size and mediastinal contours are within normal limits. Streaky airspace opacity within the left lung likely represents atelectasis. No focal consolidation. No pulmonary edema. No pleural effusion. No pneumothorax. No acute osseous abnormality. IMPRESSION: 1. Streaky airspace opacity within the left lung likely represents atelectasis. Followup PA and lateral chest X-ray is recommended in 3-4 weeks to ensure resolution. 2. Otherwise no acute cardiopulmonary abnormality.  Electronically Signed   By: Tish Frederickson M.D.   On: 10/13/2020 00:21     Assessment and Plan:   # STEMI -Cath lab activated. Stent placed in mid LAD -DAPT for 1 year (Aspirin and Ticag) -Check Lipid panel -Atorvastatin -Heparin given in the ER -Needs Echo -Cocaine and smoking cessation -Start BB and ACE-I when tolerated.   # Apical Thrombus and High LVEDP -LVEDP 24 -Formal Echo in morning -Heparin for AC -Aggressive risk factor management   Severity of Illness: The appropriate patient status for this patient is INPATIENT. Inpatient status is judged to be reasonable and  necessary in order to provide the required intensity of service to ensure the patient's safety. The patient's presenting symptoms, physical exam findings, and initial radiographic and laboratory data in the context of their chronic comorbidities is felt to place them at high risk for further clinical deterioration. Furthermore, it is not anticipated that the patient will be medically stable for discharge from the hospital within 2 midnights of admission. The following factors support the patient status of inpatient.   The patient's presenting symptoms include STEMI. The worrisome physical exam findings include STEMI The initial radiographic and laboratory data are worrisome because of STEMI     I certify that at the point of admission it is my clinical judgment that the patient will require inpatient hospital care spanning beyond 2 midnights from the point of admission due to high intensity of service, high risk for further deterioration and high frequency of surveillance required.    For questions or updates, please contact [facility] Please consult [email or web address] for contact info under     Signed, Hermelinda Dellen, MD  10/13/2020 3:42 AM

## 2020-10-13 NOTE — ED Provider Notes (Signed)
[facility] EMERGENCY DEPARTMENT Provider Note   CSN: [id number] Arrival date & time: 10/12/20  2328     History Chief Complaint - chest pain  Level 5 caveat due to acuity of condition Christopher Gibson is a 50 y.o. male.  The history is provided by the patient.  Chest Pain Pain location:  Substernal area Pain quality: pressure   Pain radiates to:  L shoulder Pain severity:  Severe Onset quality:  Gradual Duration: Several hours. Timing:  Constant Progression:  Worsening Chronicity:  New Relieved by:  Nothing Worsened by:  Nothing Associated symptoms: shortness of breath   Associated symptoms: no syncope   Risk factors: no coronary artery disease   Patient without any new medical conditions presents with chest pain.  Patient reports has been having chest pain all day.  He does admit to cocaine use just prior to having chest pain.  He does not recall ever having this condition before.         PMH-none Soc hx - admits to cocaine use Home Medications Prior to Admission medications   Not on File    Allergies    Keflex [cephalexin]  Review of Systems   Review of Systems  Unable to perform ROS: Acuity of condition  Respiratory: Positive for shortness of breath.   Cardiovascular: Positive for chest pain. Negative for syncope.    Physical Exam Updated Vital Signs BP (!) 177/105   Pulse 81   Resp (!) 21   SpO2 98%   Physical Exam CONSTITUTIONAL: Well developed, ill-appearing HEAD: Normocephalic/atraumatic EYES: EOMI/PERRL ENMT: Mucous membranes moist NECK: supple no meningeal signs SPINE/BACK:entire spine nontender CV: S1/S2 noted, no murmurs/rubs/gallops noted LUNGS: Lungs are clear to auscultation bilaterally, no apparent distress ABDOMEN: soft, nontender NEURO: Pt is awake/alert/appropriate, moves all extremitiesx4.  No facial droop.   EXTREMITIES: pulses normal/equalx4, full ROM SKIN: warm, color normal PSYCH: no abnormalities of  mood noted, alert and oriented to situation  ED Results / Procedures / Treatments   Labs (all labs ordered are listed, but only abnormal results are displayed) Labs Reviewed  RESP PANEL BY RT-PCR (FLU A&B, COVID) ARPGX2  CBC WITH DIFFERENTIAL/PLATELET  PROTIME-INR  APTT  HEMOGLOBIN A1C  COMPREHENSIVE METABOLIC PANEL  LIPID PANEL  RAPID URINE DRUG SCREEN, HOSP PERFORMED  ETHANOL  TROPONIN I (HIGH SENSITIVITY)    EKG EKG Interpretation  Date/Time:  Tuesday October 12 2020 23:41:47 EDT Ventricular Rate:  64 PR Interval:  156 QRS Duration: 98 QT Interval:  372 QTC Calculation: 383 R Axis:   116 Text Interpretation: ** Critical Test Result: STEMI Normal sinus rhythm Right axis deviation Low voltage QRS Cannot rule out Anterior infarct , age undetermined Lateral injury pattern ** ** ACUTE MI / STEMI ** ** Abnormal ECG Confirmed by Zadie Rhine ([id number]) on 10/12/2020 11:57:35 PM   Radiology DG Chest Port 1 View  Result Date: 10/13/2020 CLINICAL DATA:  Chest pain. EXAM: PORTABLE CHEST 1 VIEW COMPARISON:  None. FINDINGS: The heart size and mediastinal contours are within normal limits. Streaky airspace opacity within the left lung likely represents atelectasis. No focal consolidation. No pulmonary edema. No pleural effusion. No pneumothorax. No acute osseous abnormality. IMPRESSION: 1. Streaky airspace opacity within the left lung likely represents atelectasis. Followup PA and lateral chest X-ray is recommended in 3-4 weeks to ensure resolution. 2. Otherwise no acute cardiopulmonary abnormality. Electronically Signed   By: Tish Frederickson M.D.   On: 10/13/2020 00:21    Procedures .Critical Care Performed by: Bebe Shaggy,  Dorinda Hill, MD Authorized by: Zadie Rhine, MD   Critical care provider statement:    Critical care time (minutes):  35   Critical care start time:  10/12/2020 11:45 PM   Critical care end time:  10/13/2020 12:20 AM   Critical care time was exclusive of:  Separately  billable procedures and treating other patients   Critical care was necessary to treat or prevent imminent or life-threatening deterioration of the following conditions:  Cardiac failure and circulatory failure   Critical care was time spent personally by me on the following activities:  Discussions with consultants, evaluation of patient's response to treatment, examination of patient, obtaining history from patient or surrogate, re-evaluation of patient's condition, pulse oximetry, ordering and review of radiographic studies, ordering and review of laboratory studies and ordering and performing treatments and interventions   I assumed direction of critical care for this patient from another provider in my specialty: no     Care discussed with: admitting provider       Medications Ordered in ED Medications  0.9 %  sodium chloride infusion (has no administration in time range)  nitroGLYCERIN (NITROSTAT) SL tablet 0.4 mg (0.4 mg Sublingual Given 10/13/20 0003)  aspirin chewable tablet 324 mg (324 mg Oral Given 10/13/20 0004)    ED Course  I have reviewed the triage vital signs and the nursing notes.  Pertinent labs & imaging results that were available during my care of the patient were reviewed by me and considered in my medical decision making (see chart for details).    MDM Rules/Calculators/A&P                          Patient presents with chest pain.  He admits to having chest pain all day after using cocaine.  Patient denies any other known medical problems. Patient is very ill-appearing, initial EKG at 2336 on June 7 revealed ST changes but no STEMI.  Repeat EKG at 2341 revealed dynamic changes consistent with early STEMI Code STEMI was activated. Patient has been given aspirin nitroglycerin, heparin was ordered 0013 Discussed the case with Dr. Garnette Scheuermann with cardiology.  He has reviewed EKGs.  Patient will be admitted to the cardiac Cath Lab Final Clinical Impression(s) / ED  Diagnoses Final diagnoses:  ST elevation myocardial infarction (STEMI), unspecified artery [facility])    Rx / DC Orders ED Discharge Orders    None       Zadie Rhine, MD 10/13/20 0028

## 2020-10-13 NOTE — Progress Notes (Addendum)
Echocardiogram 2D Echocardiogram has been performed.  Christopher Gibson 10/13/2020, 1:22 PM   Dr. Anne Fu notified

## 2020-10-14 DIAGNOSIS — I10 Essential (primary) hypertension: Secondary | ICD-10-CM

## 2020-10-14 DIAGNOSIS — I513 Intracardiac thrombosis, not elsewhere classified: Secondary | ICD-10-CM

## 2020-10-14 LAB — BASIC METABOLIC PANEL
Anion gap: 13 (ref 5–15)
BUN: 10 mg/dL (ref 6–20)
CO2: 22 mmol/L (ref 22–32)
Calcium: 9 mg/dL (ref 8.9–10.3)
Chloride: 99 mmol/L (ref 98–111)
Creatinine, Ser: 0.66 mg/dL (ref 0.61–1.24)
GFR, Estimated: >60 mL/min (ref >60)
Glucose, Bld: 122 mg/dL — ABNORMAL HIGH (ref 70–99)
Potassium: 3.5 mmol/L (ref 3.5–5.1)
Sodium: 134 mmol/L — ABNORMAL LOW (ref 135–145)

## 2020-10-14 LAB — CBC
HCT: 45.1 % (ref 39.0–52.0)
Hemoglobin: 15.1 g/dL (ref 13.0–17.0)
MCH: 30.8 pg (ref 26.0–34.0)
MCHC: 33.5 g/dL (ref 30.0–36.0)
MCV: 92 fL (ref 80.0–100.0)
Platelets: 241 10*3/uL (ref 150–400)
RBC: 4.9 MIL/uL (ref 4.22–5.81)
RDW: 11.9 % (ref 11.5–15.5)
WBC: 6 10*3/uL (ref 4.0–10.5)
nRBC: 0 % (ref 0.0–0.2)

## 2020-10-14 LAB — GLUCOSE, CAPILLARY: Glucose-Capillary: 158 mg/dL — ABNORMAL HIGH (ref 70–99)

## 2020-10-14 LAB — LIPOPROTEIN A (LPA): Lipoprotein (a): 163.2 nmol/L — ABNORMAL HIGH (ref <75.0)

## 2020-10-14 LAB — MAGNESIUM: Magnesium: 1.8 mg/dL (ref 1.7–2.4)

## 2020-10-14 LAB — HEPARIN LEVEL (UNFRACTIONATED): Heparin Unfractionated: 0.25 IU/mL — ABNORMAL LOW (ref 0.30–0.70)

## 2020-10-14 MED ORDER — APIXABAN 5 MG PO TABS
5.0000 mg | ORAL_TABLET | Freq: Two times a day (BID) | ORAL | Status: DC
  Administered 2020-10-14 – 2020-10-15 (×3): 5 mg via ORAL
  Filled 2020-10-14 (×3): qty 1

## 2020-10-14 MED ORDER — CLOPIDOGREL BISULFATE 300 MG PO TABS
300.0000 mg | ORAL_TABLET | Freq: Once | ORAL | Status: AC
  Administered 2020-10-14: 300 mg via ORAL
  Filled 2020-10-14: qty 1

## 2020-10-14 MED ORDER — CLOPIDOGREL BISULFATE 75 MG PO TABS
75.0000 mg | ORAL_TABLET | Freq: Every day | ORAL | Status: DC
  Administered 2020-10-15: 75 mg via ORAL
  Filled 2020-10-14: qty 1

## 2020-10-14 MED ORDER — CARVEDILOL 6.25 MG PO TABS
6.2500 mg | ORAL_TABLET | Freq: Two times a day (BID) | ORAL | Status: DC
  Administered 2020-10-14 – 2020-10-15 (×3): 6.25 mg via ORAL
  Filled 2020-10-14 (×3): qty 1

## 2020-10-14 MED ORDER — LOSARTAN POTASSIUM 50 MG PO TABS
100.0000 mg | ORAL_TABLET | Freq: Every day | ORAL | Status: DC
  Administered 2020-10-14 – 2020-10-15 (×2): 100 mg via ORAL
  Filled 2020-10-14 (×2): qty 2

## 2020-10-14 MED ORDER — POTASSIUM CHLORIDE CRYS ER 20 MEQ PO TBCR
40.0000 meq | EXTENDED_RELEASE_TABLET | Freq: Once | ORAL | Status: AC
  Administered 2020-10-14: 40 meq via ORAL
  Filled 2020-10-14: qty 2

## 2020-10-14 NOTE — Progress Notes (Addendum)
Heart Failure Navigation Team Progress Note  PCP: No primary care provider on file. Primary Cardiologist: N/A Admitted from: home  History reviewed. No pertinent past medical history.  Social History   Socioeconomic History   Marital status: Single    Spouse name: Not on file   Number of children: Not on file   Years of education: Not on file   Highest education level: Not on file  Occupational History   Not on file  Tobacco Use   Smoking status: Every Day    Pack years: 0.00   Smokeless tobacco: Never  Substance and Sexual Activity   Alcohol use: Not on file   Drug use: Not on file   Sexual activity: Not on file  Other Topics Concern   Not on file  Social History Narrative   Not on file   Social Determinants of Health   Financial Resource Strain: Medium Risk   Difficulty of Paying Living Expenses: Somewhat hard  Food Insecurity: Food Insecurity Present   Worried About Running Out of Food in the Last Year: Sometimes true   Ran Out of Food in the Last Year: Sometimes true  Transportation Needs: No Transportation Needs   Lack of Transportation (Medical): No   Lack of Transportation (Non-Medical): No  Physical Activity: Not on file  Stress: Not on file  Social Connections: Not on file     Heart & Vascular Transition of Care Clinic follow-up: Scheduled for 10/25/20 at 2pm.  Confirmed patient has transportation.  Immediate social needs: Food Teacher, English as a foreign language, PCP, & Medication Assistance  CSW provided the patient with a CAFA application due to the patient not having any health insurance. CSW encouraged patient to bring CAFA application with him to his [facility] clinic appointment. Address and phone number updated for the patient: [address] [place] [address] Phone #[phone]  NCM scheduled patient a PCP appt. At [facility] 11/18/20 at 11am with [place], PA-C.  Cailah Reach, MSW, LCSWA [phone] Heart Failure Social Worker

## 2020-10-14 NOTE — Progress Notes (Signed)
CARDIAC REHAB PHASE I   PRE:  Rate/Rhythm: 76 JR    BP: sitting right 102/68, left 98/71    SaO2:   MODE:  Ambulation: 370 ft   POST:  Rate/Rhythm: 81 JR    BP: sitting 125/75     SaO2:    Pt BP now low after new meds. No dizziness noted. Able to walk without problems, feels good. Began discussing MI, stent, Plavix/ASA/coumadin, restrictions. Also discussed smoking cessation. Pt sts he wants to quit. He also sts he does not want to do cocaine anymore. Sts he has been under significant stress due to his 90 yo son. Discussed other methods of stress relief. Pt receptive. Left materials. Will f/u tomorrow. [id number]  Harriet Masson CES, ACSM 10/14/2020 2:23 PM

## 2020-10-14 NOTE — Progress Notes (Signed)
Progress Note  Patient Name: Christopher Gibson Date of Encounter: 10/14/2020  [facility] Cardiologist: None new- Verdis Prime MD  Subjective   No chest pain or SOB.   Inpatient Medications    Scheduled Meds:  apixaban  5 mg Oral BID   aspirin  81 mg Oral Daily   atorvastatin  80 mg Oral Daily   carvedilol  6.25 mg Oral BID WC   Chlorhexidine Gluconate Cloth  6 each Topical Daily   clopidogrel  300 mg Oral Once   [START ON 10/15/2020] clopidogrel  75 mg Oral Daily   losartan  100 mg Oral Daily   sodium chloride flush  3 mL Intravenous Q12H   Continuous Infusions:  sodium chloride     PRN Meds: sodium chloride, acetaminophen, nitroGLYCERIN, ondansetron (ZOFRAN) IV, oxyCODONE, sodium chloride flush   Vital Signs    Vitals:   10/14/20 0600 10/14/20 0734 10/14/20 0744 10/14/20 0800  BP: (!) 150/96 (!) 162/94  (!) 154/105  Pulse: 70 68  67  Resp:      Temp:   98.3 F (36.8 C)   TempSrc:   Oral   SpO2: 95%   98%  Weight:        Intake/Output Summary (Last 24 hours) at 10/14/2020 0831 Last data filed at 10/14/2020 0800 Gross per 24 hour  Intake 959.04 ml  Output 2820 ml  Net -1860.96 ml    Last 3 Weights 10/13/2020  Weight (lbs) 171 lb 15.3 oz  Weight (kg) 78 kg      Telemetry    NSR- Personally Reviewed  ECG    NSR, anterior infarct with Q waves V1-5 and T wave inversion, some ST elevation inferiorly - Personally Reviewed  Physical Exam   GEN: No acute distress. Depressed affect.  Multiple tattoos. Neck: No JVD Cardiac: RRR, no murmurs, rubs, or gallops.  Respiratory: Clear to auscultation bilaterally. GI: Soft, nontender, non-distended  MS: No edema; No deformity. No radial site hematoma Neuro:  Nonfocal  Psych: Normal affect   Labs    High Sensitivity Troponin:   Recent Labs  Lab 10/12/20 2348 10/13/20 0315 10/13/20 0653 10/13/20 0956  TROPONINIHS 2,817* >24,000*  >24,000* >24,000* >24,000*       Chemistry Recent Labs  Lab  10/12/20 2348 10/13/20 0315 10/14/20 0318  NA 139 136 134*  K 3.6 3.4* 3.5  CL 103 102 99  CO2 26 28 22   GLUCOSE 97 118* 122*  BUN 13 10 10   CREATININE 0.74 0.74 0.66  CALCIUM 9.0 8.8* 9.0  PROT 7.0  --   --   ALBUMIN 3.7  --   --   AST 40  --   --   ALT 12  --   --   ALKPHOS 56  --   --   BILITOT 0.5  --   --   GFRNONAA >60 >60 >60  ANIONGAP 10 6 13       Hematology Recent Labs  Lab 10/12/20 2348 10/13/20 0315 10/14/20 0318  WBC 4.1 5.2 6.0  RBC 4.29 3.91* 4.90  HGB 13.1 12.1* 15.1  HCT 41.1 36.6* 45.1  MCV 95.8 93.6 92.0  MCH 30.5 30.9 30.8  MCHC 31.9 33.1 33.5  RDW 12.0 12.0 11.9  PLT 231 213 241     BNP Recent Labs  Lab 10/13/20 0315  BNP 139.1*      DDimer No results for input(s): DDIMER in the last 168 hours.   Radiology    CARDIAC CATHETERIZATION  Result Date: 10/13/2020  A stent was successfully placed.   Late presenting anterior infarction due to occlusion of the mid LAD.  Successful stenting of the mid LAD using an 18 x 2.5 Onyx postdilated to 3.5 mm in diameter with TIMI grade III flow.  Widely patent left main  Diffuse luminal irregularities in the proximal and mid LAD  Diffuse mild to moderate nonobstructive atherosclerosis in the circumflex.  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  EF 30 to 35%.  LVEDP 24 mmHg.  Acute systolic heart failure based upon hemodynamics and wall motion. RECOMMENDATIONS:  Aggressive risk factor modification with high intensity statin therapy, screening for diabetes, blood pressure control.  IV nitroglycerin for blood pressure control and to decrease LVEDP.  She did run for 12 to 24 hours  Start ARB and beta-blocker therapy as tolerated by blood pressure and clinical status  2D Doppler echocardiogram to assess for apical thrombus.  IV heparin will be restarted because of high index of suspicion of apical  thrombus.  Further management per team   DG Chest Port 1 View  Result Date: 10/13/2020 CLINICAL DATA:  Chest pain. EXAM: PORTABLE CHEST 1 VIEW COMPARISON:  None. FINDINGS: The heart size and mediastinal contours are within normal limits. Streaky airspace opacity within the left lung likely represents atelectasis. No focal consolidation. No pulmonary edema. No pleural effusion. No pneumothorax. No acute osseous abnormality. IMPRESSION: 1. Streaky airspace opacity within the left lung likely represents atelectasis. Followup PA and lateral chest X-ray is recommended in 3-4 weeks to ensure resolution. 2. Otherwise no acute cardiopulmonary abnormality. Electronically Signed   By: Tish Frederickson M.D.   On: 10/13/2020 00:21   ECHOCARDIOGRAM COMPLETE  Result Date: 10/13/2020    ECHOCARDIOGRAM REPORT   Patient Name:   Christopher Gibson Date of Exam: 10/13/2020 Medical Rec #:  [id number]       Height:       66.0 in Accession #:    [id number]      Weight:       170.0 lb Date of Birth:  1971-02-09       BSA:          1.867 m Patient Age:    50 years        BP:           110/75 mmHg Patient Gender: M               HR:           72 bpm. Exam Location:  Inpatient Procedure: 3D Echo, 2D Echo, Cardiac Doppler, Color Doppler and Intracardiac            Opacification Agent Indications:    Acute MI i21.9  History:        Patient has no prior history of Echocardiogram examinations.                 CAD.  Sonographer:    Irving Burton Senior RDCS Referring Phys: [id number] Barry Dienes [facility] IMPRESSIONS  1. Small thrombus apex of left ventricle (see image 91). Left ventricular ejection fraction by 3D volume is 42 %. The left ventricle has mild to moderately decreased function. The left ventricle has no regional wall motion abnormalities. There is mild left ventricular hypertrophy. Left ventricular diastolic parameters are consistent with Grade I diastolic dysfunction (impaired relaxation).  2. Right ventricular systolic function is normal. The right  ventricular size is  normal.  3. The mitral valve is normal in structure. No evidence of mitral valve regurgitation. No evidence of mitral stenosis.  4. The aortic valve is normal in structure. Aortic valve regurgitation is not visualized. No aortic stenosis is present.  5. The inferior vena cava is normal in size with greater than 50% respiratory variability, suggesting right atrial pressure of 3 mmHg. FINDINGS  Left Ventricle: Small thrombus apex of left ventricle (see image 91). Left ventricular ejection fraction by 3D volume is 42 %. The left ventricle has mild to moderately decreased function. The left ventricle has no regional wall motion abnormalities. Definity contrast agent was given IV to delineate the left ventricular endocardial borders. The left ventricular internal cavity size was normal in size. There is mild left ventricular hypertrophy. Left ventricular diastolic parameters are consistent with Grade I diastolic dysfunction (impaired relaxation).  LV Wall Scoring: The mid and distal anterior septum, mid inferoseptal segment, apical anterior segment, apical inferior segment, and apex are akinetic. The mid inferior segment is normal. Right Ventricle: The right ventricular size is normal. No increase in right ventricular wall thickness. Right ventricular systolic function is normal. Left Atrium: Left atrial size was normal in size. Right Atrium: Right atrial size was normal in size. Pericardium: There is no evidence of pericardial effusion. Mitral Valve: The mitral valve is normal in structure. No evidence of mitral valve regurgitation. No evidence of mitral valve stenosis. Tricuspid Valve: The tricuspid valve is normal in structure. Tricuspid valve regurgitation is not demonstrated. No evidence of tricuspid stenosis. Aortic Valve: The aortic valve is normal in structure. Aortic valve regurgitation is not visualized. No aortic stenosis is present. Pulmonic Valve: The pulmonic valve was normal in  structure. Pulmonic valve regurgitation is not visualized. No evidence of pulmonic stenosis. Aorta: The aortic root is normal in size and structure. Venous: The inferior vena cava is normal in size with greater than 50% respiratory variability, suggesting right atrial pressure of 3 mmHg. IAS/Shunts: No atrial level shunt detected by color flow Doppler.  LEFT VENTRICLE PLAX 2D LVIDd:         4.30 cm         Diastology LVIDs:         2.80 cm         LV e' medial:    5.22 cm/s LV PW:         0.80 cm         LV E/e' medial:  9.1 LV IVS:        1.20 cm         LV e' lateral:   8.05 cm/s LVOT diam:     1.90 cm         LV E/e' lateral: 5.9 LV SV:         43 LV SV Index:   23 LVOT Area:     2.84 cm        3D Volume EF                                LV 3D EF:    Left                                             ventricular  ejection                                             fraction by                                             3D volume                                             is 42 %.                                 3D Volume EF:                                3D EF:        42 % RIGHT VENTRICLE RV S prime:     12.60 cm/s TAPSE (M-mode): 1.9 cm LEFT ATRIUM             Index       RIGHT ATRIUM          Index LA diam:        2.60 cm 1.39 cm/m  RA Area:     8.81 cm LA Vol (A2C):   22.3 ml 11.95 ml/m RA Volume:   12.80 ml 6.86 ml/m LA Vol (A4C):   24.1 ml 12.91 ml/m LA Biplane Vol: 25.0 ml 13.39 ml/m  AORTIC VALVE LVOT Vmax:   84.90 cm/s LVOT Vmean:  62.200 cm/s LVOT VTI:    0.150 m  AORTA Ao Root diam: 3.40 cm MITRAL VALVE MV Area (PHT): 2.93 cm    SHUNTS MV Decel Time: 259 msec    Systemic VTI:  0.15 m MV E velocity: 47.60 cm/s  Systemic Diam: 1.90 cm MV A velocity: 60.80 cm/s MV E/A ratio:  0.78 Donato Schultz MD Electronically signed by Donato Schultz MD Signature Date/Time: 10/13/2020/1:33:44 PM    Final      Cardiac Studies   LEFT HEART CATH AND CORONARY ANGIOGRAPHY     Conclusion    A stent was successfully placed.   Late presenting anterior infarction due to occlusion of the mid LAD. Successful stenting of the mid LAD using an 18 x 2.5 Onyx postdilated to 3.5 mm in diameter with TIMI grade III flow. Widely patent left main Diffuse luminal irregularities in the proximal and mid LAD Diffuse mild to moderate nonobstructive atherosclerosis in the circumflex. 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  EF 30 to 35%.  LVEDP 24 mmHg. Acute systolic heart failure based upon hemodynamics and wall motion.   RECOMMENDATIONS:   Aggressive risk factor modification with high intensity statin therapy, screening for diabetes, blood pressure control. IV nitroglycerin for blood pressure control and to decrease LVEDP.  She did run for 12 to 24 hours Start ARB and beta-blocker therapy as tolerated by blood pressure and clinical status 2D Doppler echocardiogram to assess for apical thrombus. IV heparin will be restarted because of high  index of suspicion of apical thrombus. Further management per team  Diagnostic Dominance: Right    Intervention     Implants      Echo: IMPRESSIONS     1. Small thrombus apex of left ventricle (see image 91). Left ventricular  ejection fraction by 3D volume is 42 %. The left ventricle has mild to  moderately decreased function. The left ventricle has no regional wall  motion abnormalities. There is mild  left ventricular hypertrophy. Left ventricular diastolic parameters are  consistent with Grade I diastolic dysfunction (impaired relaxation).   2. Right ventricular systolic function is normal. The right ventricular  size is normal.   3. The mitral valve is normal in structure. No evidence of mitral valve  regurgitation. No evidence of mitral stenosis.   4. The aortic valve is normal in structure. Aortic valve  regurgitation is  not visualized. No aortic stenosis is present.   5. The inferior vena cava is normal in size with greater than 50%  respiratory variability, suggesting right atrial pressure of 3 mmHg.   Patient Profile     50 y.o. male with history of active cocaine and tobacco abuse presents late in the course of acute anterior STEMI  Assessment & Plan    1. Acute anterior STEMI with late presentation. Onset of chest pain 18 hours prior to presentation. Q waves anteriorly. Troponin > 24K. On cath has anteroapical dyskinesis.  -s/p DES of mid LAD -DAPT for 1 year (Aspirin and Plavix given need for anticoagulation) -high dose Atorvastatin - EF 42% by Echo -Cocaine and smoking cessation -will increase Coreg and losartan given persistent HTN. If BP remains high would consider adding nitrates/hydralazine.   2. Acute systolic CHF. -LVEDP 24-at cath. Given lasix x 1. Lungs clear -EF 42% by Echo -increasing Coreg and ARB.  -Aggressive risk factor management  3. Tobacco abuse. Counseled on importance of cessation.   4. Cocaine abuse.Counseled on importance of cessation.   5. Hypercholesterolemia. LDL 98. On high dose statin  6. HTN. Poorly controlled. See above  7. LV thrombus. Small. Will need anticoagulation. Plan to transition IV heparin to Eliquis.   8. Poor social situation. No insurance. High risk for noncompliance. Will ask Case management to assist.   For questions or updates, please contact [facility] Please consult [email or web address] for contact info under        Signed, Saamir Armstrong [place], MD  10/14/2020, 8:31 AM

## 2020-10-14 NOTE — Care Management (Signed)
10-14-20 1451 Case Manager spoke with the patient regarding primary care provider (PCP) needs. Patient is without PCP at this time. Patient is agreeable to an appointment at the [facility]- appointment scheduled and placed on the AVS. Patient is without insurance at this time. Patient will receive the 30 day free Eliquis card. Please send all new Rx to the [facility]. Case Manager will continue to follow for [facility] assistance. Patient will have access to the [facility] once the patient is discharged and medication will range from $4.00-$10.00.

## 2020-10-14 NOTE — Progress Notes (Signed)
Paged regarding ST elevations on telemetry with subsequent ECG documenting evolving STEMI. Patient with late presenting STEMI now s/p PCI to mLAD. Patient otherwise asx on my assessment, CP/pressure free, no N/V or other sx. HTN to the 170s, increased coreg to 6.25 mg PO bid from 3.125 mg PO bid started yesterday as he seems to be tolerating this fine. ECG with more pronounced anterolateral TWI which is c/w his presentation.

## 2020-10-14 NOTE — Discharge Instructions (Addendum)
Information on my medicine - ELIQUIS (apixaban)  Why was Eliquis prescribed for you? Eliquis was prescribed for you to reduce the risk of stroke due to a clot found in the heart.  What do You need to know about Eliquis ? Take your Eliquis TWICE DAILY - one tablet in the morning and one tablet in the evening with or without food. If you have difficulty swallowing the tablet whole please discuss with your pharmacist how to take the medication safely.  Take Eliquis exactly as prescribed by your doctor and DO NOT stop taking Eliquis without talking to the doctor who prescribed the medication.  Stopping may increase your risk of developing a stroke.  Refill your prescription before you run out.  After discharge, you should have regular check-up appointments with your healthcare provider that is prescribing your Eliquis.  In the future your dose may need to be changed if your kidney function or weight changes by a significant amount or as you get older.  What do you do if you miss a dose? If you miss a dose, take it as soon as you remember on the same day and resume taking twice daily.  Do not take more than one dose of ELIQUIS at the same time to make up a missed dose.  Important Safety Information A possible side effect of Eliquis is bleeding. You should call your healthcare provider right away if you experience any of the following: Bleeding from an injury or your nose that does not stop. Unusual colored urine (red or dark brown) or unusual colored stools (red or black). Unusual bruising for unknown reasons. A serious fall or if you hit your head (even if there is no bleeding).  Some medicines may interact with Eliquis and might increase your risk of bleeding or clotting while on Eliquis. To help avoid this, consult your healthcare provider or pharmacist prior to using any new prescription or non-prescription medications, including herbals, vitamins, non-steroidal anti-inflammatory  drugs (NSAIDs) and supplements.  This website has more information on Eliquis (apixaban): http://www.eliquis.com/eliquis/home

## 2020-10-14 NOTE — Progress Notes (Signed)
ANTICOAGULATION CONSULT NOTE   Pharmacy Consult for Heparin  Indication: chest pain/ACS, s/p cath, high suspicion for apical thrombus  Allergies  Allergen Reactions   Keflex [Cephalexin]       Vital Signs: Temp: 99.7 F (37.6 C) (06/09 0400) Temp Source: Oral (06/09 0400) BP: 127/91 (06/09 0300) Pulse Rate: 71 (06/09 0300)  Labs: Recent Labs    10/12/20 2348 10/13/20 0315 10/13/20 0653 10/13/20 0956 10/13/20 1658 10/14/20 0318  HGB 13.1 12.1*  --   --   --  15.1  HCT 41.1 36.6*  --   --   --  45.1  PLT 231 213  --   --   --  241  APTT 27  --   --   --   --   --   LABPROT 11.8  --   --   --   --   --   INR 0.9  --   --   --   --   --   HEPARINUNFRC  --   --   --   --  <0.10* 0.25*  CREATININE 0.74 0.74  --   --   --  0.66  TROPONINIHS 2,817* >24,000*  >24,000* >24,000* >24,000*  --   --      CrCl cannot be calculated (Unknown ideal weight.).     Assessment: 50 y/o M CODE STEMI s/p stenting of the mid LAD. High suspicion for apical thrombus. Starting heparin 8 hours after sheath removal. Sheath removed at 0142. CBC good. Renal function good.   Heparin level subtherapeutic (0.25) on gtt at 1200 units/hr. No issues with line or bleeding reported per RN.   Goal of Therapy:  Heparin level 0.3-0.7 units/ml Monitor platelets by anticoagulation protocol: Yes   Plan:  Increase IV heparin to 1350 units/hr. Repeat heparin level in 8 hrs.  Christoper Fabian, PharmD, BCPS Please see amion for complete clinical pharmacist phone list  10/14/2020 4:06 AM

## 2020-10-14 NOTE — Progress Notes (Addendum)
Heart Failure Stewardship Pharmacist Progress Note   PCP: No primary care provider on file. PCP-Cardiologist: None    HPI:  81 yoM w/ PMH of cocaine and tobacco use presenting late w/ CP on 6/8, evaluated for STEMI. Cath revealed 100% occlusion of LAD, DES placed, EF estimated 30-35% in cath, LVEDP 24. ECHO revealed EF 42% with G1DD and normal RV systolic function.  Current HF Medications: - Losartan 100 mg daily - Carvedilol 6.25 mg BID  Prior to admission HF Medications: - N/A  Pertinent Lab Values: Serum creatinine 0.66, BUN 10, Potassium 3.5, Sodium 134, BNP 139  Vital Signs: Weight: 171lbs Blood pressure: 150/100s  Heart rate: 60-70s  Medication Assistance / Insurance Benefits Check: Does the patient have prescription insurance?  No  Does the patient qualify for medication assistance through manufacturers or grants?   Yes Eligible grants and/or patient assistance programs: pending Medication assistance applications in progress: N/A  Medication assistance applications approved: N/A Approved medication assistance renewals will be completed by: pending  Outpatient Pharmacy:  Prior to admission outpatient pharmacy: [facility] Is the patient willing to use [facility] TOC pharmacy at discharge? Yes Is the patient willing to transition their outpatient pharmacy to utilize a [facility] outpatient pharmacy?   Pending    Assessment: 1. New onset CHF (EF 42%), due to ischemic CM (potential hx cocaine/tobacco use). NYHA class II symptoms. - BP elevated in the 160s systolic, increased carvedilol to 6.25mg  BID and losartan to 100mg  daily.  - Consider adding spironolactone for further BP reduction and HF benefit, BP still elevated after increased doses of carvedilol and losartan - Consider optimizing losartan to [facility] tomorrow - Consider adding Farxiga 10mg  daily prior to discharge   Plan: 1) Medication changes recommended at this time: - Add spironolactone 25 mg daily  2) Patient  assistance: - No insurance - can utilize free 30 day cards at [facility] pharmacy and then can complete long-term patient assistance applications for [place], Brilinta, and Farxiga at HF TOC follow up. This will allow copays to be $0 per month for each medication once approved. - HF TOC appt 6/20  3)  Education  - To be completed prior to discharge  05-12-2002 7/20) Krystopher Kuenzel, PharmD Student

## 2020-10-15 ENCOUNTER — Other Ambulatory Visit (HOSPITAL_COMMUNITY): Payer: Self-pay

## 2020-10-15 LAB — BASIC METABOLIC PANEL
Anion gap: 9 (ref 5–15)
BUN: 18 mg/dL (ref 6–20)
CO2: 26 mmol/L (ref 22–32)
Calcium: 9.1 mg/dL (ref 8.9–10.3)
Chloride: 99 mmol/L (ref 98–111)
Creatinine, Ser: 1.04 mg/dL (ref 0.61–1.24)
GFR, Estimated: >60 mL/min (ref >60)
Glucose, Bld: 110 mg/dL — ABNORMAL HIGH (ref 70–99)
Potassium: 4.1 mmol/L (ref 3.5–5.1)
Sodium: 134 mmol/L — ABNORMAL LOW (ref 135–145)

## 2020-10-15 MED ORDER — NITROGLYCERIN 0.4 MG SL SUBL
0.4000 mg | SUBLINGUAL_TABLET | SUBLINGUAL | 1 refills | Status: DC | PRN
  Filled 2020-10-15: qty 25, 7d supply, fill #0

## 2020-10-15 MED ORDER — APIXABAN 5 MG PO TABS
5.0000 mg | ORAL_TABLET | Freq: Two times a day (BID) | ORAL | 3 refills | Status: DC
  Filled 2020-10-15: qty 60, 30d supply, fill #0

## 2020-10-15 MED ORDER — CLOPIDOGREL BISULFATE 75 MG PO TABS
75.0000 mg | ORAL_TABLET | Freq: Every day | ORAL | 11 refills | Status: DC
  Filled 2020-10-15: qty 30, 30d supply, fill #0

## 2020-10-15 MED ORDER — LOSARTAN POTASSIUM 50 MG PO TABS
100.0000 mg | ORAL_TABLET | Freq: Every day | ORAL | 11 refills | Status: DC
  Filled 2020-10-15: qty 60, 30d supply, fill #0

## 2020-10-15 MED ORDER — ASPIRIN 81 MG PO TBEC
81.0000 mg | DELAYED_RELEASE_TABLET | Freq: Every day | ORAL | 0 refills | Status: DC
  Filled 2020-10-15: qty 30, 30d supply, fill #0

## 2020-10-15 MED ORDER — ATORVASTATIN CALCIUM 80 MG PO TABS
80.0000 mg | ORAL_TABLET | Freq: Every day | ORAL | 11 refills | Status: DC
  Filled 2020-10-15: qty 30, 30d supply, fill #0

## 2020-10-15 MED ORDER — CARVEDILOL 6.25 MG PO TABS
6.2500 mg | ORAL_TABLET | Freq: Two times a day (BID) | ORAL | 11 refills | Status: DC
  Filled 2020-10-15: qty 60, 30d supply, fill #0

## 2020-10-15 NOTE — Care Management (Signed)
2035 10-15-20 MATCH completed for this patient. TOC Pharmacy to deliver the medications to the room. No further needs identified at this time.

## 2020-10-15 NOTE — Progress Notes (Signed)
Pt is alert and oriented. Discharge instructions/ AVS givent to pt.

## 2020-10-15 NOTE — Plan of Care (Signed)
  Problem: Clinical Measurements: Goal: Cardiovascular complication will be avoided Outcome: Progressing   Problem: Pain Managment: Goal: General experience of comfort will improve Outcome: Progressing   Problem: Skin Integrity: Goal: Risk for impaired skin integrity will decrease Outcome: Progressing   

## 2020-10-15 NOTE — Progress Notes (Signed)
CARDIAC REHAB PHASE I   PRE:  Rate/Rhythm: 68 SR    BP: sitting right 77/53, left 106/61    SaO2: 100 RA  MODE:  Ambulation: 470 ft   POST:  Rate/Rhythm: 91 SR    BP: sitting 109/78     SaO2:   Tolerated well, no c/o. Discussed smoking cessation more, diet including low sodium, exercise, NTG and CRPII. Also discussed signs of fluid retention and calling MD. Pt receptive. [place] therefore virtual CRPII will be better. Will refer to [place].   [id number]  Harriet Masson CES, ACSM 10/15/2020 10:11 AM

## 2020-10-15 NOTE — Discharge Summary (Signed)
Discharge Summary    Patient ID: Christopher Gibson MRN: [id number]; DOB: 14-Nov-1970  Admit date: 10/12/2020 Discharge date: 10/15/2020  PCP:  No primary care provider on file.   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 Small thrombus apex of left ventricle (see image 91). Left ventricular  ejection fraction by 3D volume is 42 %. The left ventricle has mild to  moderately decreased function. The left ventricle has no regional wall  motion abnormalities. There is mild  left ventricular hypertrophy. Left ventricular diastolic parameters are  consistent with Grade I diastolic dysfunction (impaired relaxation).   2. Right ventricular systolic function is normal. The right ventricular  size is normal.   3. The mitral valve is normal in structure. No evidence of mitral valve  regurgitation. No evidence of mitral stenosis.   4. The aortic valve is normal in structure. Aortic valve regurgitation is  not visualized. No aortic stenosis is present.   5. The inferior vena cava is normal in size with greater than 50%  respiratory variability, suggesting right atrial pressure of 3 mmHg.   Coronary/Graft Acute MI Revascularization  LEFT HEART CATH AND CORONARY ANGIOGRAPHY    Conclusion    A stent was successfully placed.   Late presenting anterior infarction due to occlusion of the mid LAD. Successful stenting of the mid LAD  using an 18 x 2.5 Onyx postdilated to 3.5 mm in diameter with TIMI grade III flow. Widely patent left main Diffuse luminal irregularities in the proximal and mid LAD Diffuse mild to moderate nonobstructive atherosclerosis in the circumflex. 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  EF 30 to 35%.  LVEDP 24 mmHg. Acute systolic heart failure based upon hemodynamics and wall motion.   RECOMMENDATIONS:   Aggressive risk factor modification with high intensity statin therapy, screening for diabetes, blood pressure control. IV nitroglycerin for blood pressure control and to decrease LVEDP.  She did run for 12 to 24 hours Start ARB and beta-blocker therapy as tolerated by blood pressure and clinical status 2D Doppler echocardiogram to assess for apical thrombus. IV heparin will be restarted because of high index of suspicion of apical thrombus. Further management per team  Diagnostic Dominance: Right    Intervention      History of Present Illness     Christopher Gibson is a 50 y.o. male with hx of cocaine and tobacco abuse presented for CP evaluation and found to have late presenting STEMI.  He used cocaine in AM of 10/13/20 after which he had chest pressure and pain. He waited it out; then ultimately came to the ED in the evening. EKG was concerning for STEMI; chest pain was ongoing. Hence STEMI was activated from the ED. No hx of DM or HTN. Troponin were very high in the ED. Mild SOB. Smokes regularly. Nitro  and Aspirin were given in the ED.   Hospital Course     Consultants: None   1. Late presenting acute anterior STEMI:  -  Onset of chest pain 18 hours prior to presentation. Q waves anteriorly. Troponin > 24K. On cath has anteroapical dyskinesis. S/p DES of mid LAD.  -Continue DAPT for 1 month (Aspirin and Plavix given need for anticoagulation)>> and then stop ASA -Continue  Atorvastatin - LVEF 42% by Echo -Cocaine and smoking cessation -Continue Coreg and losartan  - Ambulated well without recurrent chest pain    2. Acute systolic CHF. -LVEDP 24-at cath. Given lasix x 1. Lungs clear. Appears euvolemic -EF 42% by Echo -Up-titrated Coreg and ARB. -Worried about compliance>>> can consider changing losartan to Entresto and adding Spironolactone and Farxiga in outpatient setting (will wait today given soft blood pressure last night and little bump in Scr>> Has follow up with TOC Heart Failure clinic on 6/20 (will need medications assistance). Patient reports he stays in [place] most of the times -Recommended low sodium diet    3. Tobacco abuse - Recommended cessation.   4. Cocaine abuse - Recommended cessation.   5. Hypercholesterolemia - 10/12/2020: Cholesterol 159; HDL 44; LDL Cholesterol 98; Triglycerides 87; VLDL 17  - Continue high intensity statin  - Consider OP f/u labs 6-8 weeks given statin initiation this admission.    6. HTN - Now stable. Continue current dose of Coreg and Losartan    7. LV thrombus - Small by echo.  Transitioned IV heparin to Eliquis.   8. Poor social situation - No insurance. High risk for noncompliance. Case management and TOC clinic to assist. Discussed importance of medications and abstinence from poly substance abuse.   Did the patient have an acute coronary syndrome (MI, NSTEMI, STEMI, etc) this admission?:  Yes                               AHA/ACC Clinical Performance & Quality Measures: Aspirin prescribed? - Yes ADP Receptor Inhibitor (Plavix/Clopidogrel, Brilinta/Ticagrelor or Effient/Prasugrel) prescribed (includes medically managed patients)? - Yes Beta Blocker prescribed? - Yes High Intensity Statin (Lipitor 40-80mg  or Crestor 20-40mg ) prescribed? - Yes EF assessed during THIS hospitalization? - Yes For EF <40%, was ACEI/ARB prescribed? - Yes For EF <40%, Aldosterone Antagonist (Spironolactone or Eplerenone)  prescribed? - Not Applicable (EF >/= 40%) consider during outpatient follow up  Cardiac Rehab Phase II ordered (including medically managed patients)? - Yes    Discharge Vitals Blood pressure 115/74, pulse 64, temperature 98 F (36.7 C), temperature source Oral, resp. rate 16, height  (1.676 m), weight 79.2 kg, SpO2 100 %.  Filed Weights   10/13/20 2000 10/15/20 0109  Weight: 78 kg 79.2 kg    Labs & Radiologic Studies    CBC Recent Labs    10/12/20 2348 10/13/20 0315 10/14/20 0318  WBC 4.1 5.2 6.0  NEUTROABS 2.4  --   --   HGB 13.1 12.1* 15.1  HCT 41.1 36.6* 45.1  MCV 95.8 93.6 92.0  PLT 231 213 241   Basic Metabolic Panel Recent Labs    16/10/96 0318 10/15/20 0432  NA 134* 134*  K 3.5 4.1  CL 99 99  CO2 22 26  GLUCOSE 122* 110*  BUN 10 18  CREATININE 0.66 1.04  CALCIUM 9.0 9.1  MG 1.8  --    Liver Function Tests Recent Labs    10/12/20 2348  AST 40  ALT 12  ALKPHOS 56  BILITOT 0.5  PROT 7.0  ALBUMIN 3.7    High Sensitivity Troponin:   Recent Labs  Lab 10/12/20 2348 10/13/20 0315 10/13/20 0653 10/13/20 0956  TROPONINIHS 2,817* >24,000*  >24,000* >24,000* >24,000*     Hemoglobin A1C Recent Labs    10/12/20 2348  HGBA1C 5.8*   Fasting Lipid Panel Recent Labs    10/12/20 2348  CHOL 159  HDL 44  LDLCALC 98  TRIG 87  CHOLHDL 3.6    _____________  CARDIAC CATHETERIZATION  Result Date: 10/13/2020  A stent was successfully placed.   Late presenting anterior infarction due to occlusion of the mid LAD.  Successful stenting of the mid LAD using an 18 x 2.5 Onyx postdilated to 3.5 mm in diameter with TIMI grade III flow.  Widely patent left main  Diffuse luminal irregularities in the proximal and mid LAD  Diffuse mild to moderate nonobstructive atherosclerosis in the circumflex.  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  EF 30 to  35%.  LVEDP 24 mmHg.  Acute systolic heart failure based upon hemodynamics and wall motion. RECOMMENDATIONS:  Aggressive risk factor modification with high intensity statin therapy, screening for diabetes, blood pressure control.  IV nitroglycerin for blood pressure control and to decrease LVEDP.  She did run for 12 to 24 hours  Start ARB and beta-blocker therapy as tolerated by blood pressure and clinical status  2D Doppler echocardiogram to assess for apical thrombus.  IV heparin will be restarted because of high index of suspicion of apical thrombus.  Further management per team   DG Chest Port 1 View  Result Date: 10/13/2020 CLINICAL DATA:  Chest pain. EXAM: PORTABLE CHEST 1 VIEW COMPARISON:  None. FINDINGS: The heart size and mediastinal contours are within normal limits. Streaky airspace opacity within the left lung likely represents atelectasis. No focal consolidation. No pulmonary edema. No pleural effusion. No pneumothorax. No acute osseous abnormality. IMPRESSION: 1. Streaky airspace opacity within the left lung likely represents atelectasis. Followup PA and lateral chest X-ray is recommended in 3-4 weeks to ensure resolution. 2. Otherwise no acute cardiopulmonary abnormality. Electronically Signed   By: Tish Frederickson M.D.   On: 10/13/2020 00:21   ECHOCARDIOGRAM COMPLETE  Result Date: 10/13/2020    ECHOCARDIOGRAM REPORT   Patient Name:   Christopher Gibson [place]leton Date of Exam: 10/13/2020 Medical Rec #:  [id number]       Height:       66.0 in Accession #:    [id number]      Weight:       170.0 lb Date of Birth:  [birth year]       BSA:          1.867 m Patient Age:    49 years        BP:           110/75 mmHg Patient Gender: M               HR:           72 bpm. Exam Location:  Inpatient Procedure: 3D Echo, 2D Echo, Cardiac Doppler, Color Doppler and Intracardiac            Opacification Agent Indications:    Acute MI i21.9  History:        Patient has no prior history of Echocardiogram examinations.  CAD.  Sonographer:    Irving Burton Senior RDCS Referring Phys: [phone] Barry Dienes [facility] IMPRESSIONS  1. Small thrombus apex of left ventricle (see image 91). Left ventricular ejection fraction by 3D volume is 42 %. The left ventricle has mild to moderately decreased function. The left ventricle has no regional wall motion abnormalities. There is mild left ventricular hypertrophy. Left ventricular diastolic parameters are consistent with Grade I diastolic dysfunction (impaired relaxation).  2. Right ventricular systolic function is normal. The right ventricular size is normal.  3. The mitral valve is normal in structure. No evidence of mitral valve regurgitation. No evidence of mitral stenosis.  4. The aortic valve is normal in structure. Aortic valve regurgitation is not visualized. No aortic stenosis is present.  5. The inferior vena cava is normal in size with greater than 50% respiratory variability, suggesting right atrial pressure of 3 mmHg. FINDINGS  Left Ventricle: Small thrombus apex of left ventricle (see image 91). Left ventricular ejection fraction by 3D volume is 42 %. The left ventricle has mild to moderately decreased function. The left ventricle has no regional wall motion abnormalities. Definity contrast agent was given IV to delineate the left ventricular endocardial borders. The left ventricular internal cavity size was normal in size. There is mild left ventricular hypertrophy. Left ventricular diastolic parameters are consistent with Grade I diastolic dysfunction (impaired relaxation).  LV Wall Scoring: The mid and distal anterior septum, mid inferoseptal segment, apical anterior segment, apical inferior segment, and apex are akinetic. The mid inferior segment is normal. Right Ventricle: The right ventricular size is normal. No increase in right ventricular wall thickness. Right ventricular systolic function is normal. Left Atrium: Left atrial size was normal in size. Right Atrium: Right atrial size  was normal in size. Pericardium: There is no evidence of pericardial effusion. Mitral Valve: The mitral valve is normal in structure. No evidence of mitral valve regurgitation. No evidence of mitral valve stenosis. Tricuspid Valve: The tricuspid valve is normal in structure. Tricuspid valve regurgitation is not demonstrated. No evidence of tricuspid stenosis. Aortic Valve: The aortic valve is normal in structure. Aortic valve regurgitation is not visualized. No aortic stenosis is present. Pulmonic Valve: The pulmonic valve was normal in structure. Pulmonic valve regurgitation is not visualized. No evidence of pulmonic stenosis. Aorta: The aortic root is normal in size and structure. Venous: The inferior vena cava is normal in size with greater than 50% respiratory variability, suggesting right atrial pressure of 3 mmHg. IAS/Shunts: No atrial level shunt detected by color flow Doppler.  LEFT VENTRICLE PLAX 2D LVIDd:         4.30 cm         Diastology LVIDs:         2.80 cm         LV e' medial:    5.22 cm/s LV PW:         0.80 cm         LV E/e' medial:  9.1 LV IVS:        1.20 cm         LV e' lateral:   8.05 cm/s LVOT diam:     1.90 cm         LV E/e' lateral: 5.9 LV SV:         43 LV SV Index:   23 LVOT Area:     2.84 cm        3D Volume EF  LV 3D EF:    Left                                             ventricular                                             ejection                                             fraction by                                             3D volume                                             is 42 %.                                 3D Volume EF:                                3D EF:        42 % RIGHT VENTRICLE RV S prime:     12.60 cm/s TAPSE (M-mode): 1.9 cm LEFT ATRIUM             Index       RIGHT ATRIUM          Index LA diam:        2.60 cm 1.39 cm/m  RA Area:     8.81 cm LA Vol (A2C):   22.3 ml 11.95 ml/m RA Volume:   12.80 ml 6.86 ml/m LA Vol  (A4C):   24.1 ml 12.91 ml/m LA Biplane Vol: 25.0 ml 13.39 ml/m  AORTIC VALVE LVOT Vmax:   84.90 cm/s LVOT Vmean:  62.200 cm/s LVOT VTI:    0.150 m  AORTA Ao Root diam: 3.40 cm MITRAL VALVE MV Area (PHT): 2.93 cm    SHUNTS MV Decel Time: 259 msec    Systemic VTI:  0.15 m MV E velocity: 47.60 cm/s  Systemic Diam: 1.90 cm MV A velocity: 60.80 cm/s MV E/A ratio:  0.78 Donato SchultzMark Skains MD Electronically signed by Donato SchultzMark Skains MD Signature Date/Time: 10/13/2020/1:33:44 PM    Final    Disposition   Pt is being discharged home today in good condition.  Follow-up Plans & Appointments     Follow-up Information     [facility] Follow up on 11/18/2020.   Why: Hospital follow up appointment with Dr. Sharon SellerMcClung at 11:00 am. If you cannot make this scheduled appointment, please call the clinic to reschedule. Onsite Pharmacy and medications range from $4.00-$10.00. Contact information: [address] [place] [id number] [phone]        Filbert SchilderMcDaniel, Jill D,  NP Follow up on 11/23/2020.   Specialty: Cardiology Why: @11 :15am for hospital follow up with Dr.Smith's PA/NP Contact information: [address] [place] [phone]                Discharge Instructions     Diet - low sodium heart healthy   Complete by: As directed    Discharge instructions   Complete by: As directed    No driving for 2 weeks. No lifting over 10 lbs for 4 weeks. No sexual activity for 4 weeks. You may not return to work until cleared by your cardiologist. Keep procedure site clean & dry. If you notice increased pain, swelling, bleeding or pus, call/return!  You may shower, but no soaking baths/hot tubs/pools for 1 week.   Increase activity slowly   Complete by: As directed        Discharge Medications   Allergies as of 10/15/2020       Reactions   Keflex [cephalexin]         Medication List     TAKE these medications    apixaban 5 MG  Tabs tablet Commonly known as: ELIQUIS Take 1 tablet (5 mg total) by mouth 2 (two) times daily.   aspirin EC 81 MG tablet Take 1 tablet (81 mg total) by mouth daily. Swallow whole.   atorvastatin 80 MG tablet Commonly known as: LIPITOR Take 1 tablet (80 mg total) by mouth daily. Start taking on: October 16, 2020   carvedilol 6.25 MG tablet Commonly known as: COREG Take 1 tablet (6.25 mg total) by mouth 2 (two) times daily with a meal.   clopidogrel 75 MG tablet Commonly known as: PLAVIX Take 1 tablet (75 mg total) by mouth daily. Start taking on: October 16, 2020   losartan 50 MG tablet Commonly known as: COZAAR Take 2 tablets (100 mg total) by mouth daily. Start taking on: October 16, 2020   nitroGLYCERIN 0.4 MG SL tablet Commonly known as: NITROSTAT Place 1 tablet (0.4 mg total) under the tongue every 5 (five) minutes as needed for chest pain (CP or SOB).           Outstanding Labs/Studies   BMP at follow up with TOC Heart Failure Clinic Consider OP f/u labs 6-8 weeks given statin initiation this admission.   Duration of Discharge Encounter   Greater than 30 minutes including physician time.  October 18, 2020, PA 10/15/2020, 8:57 AM

## 2020-10-15 NOTE — Progress Notes (Signed)
Heart Failure Stewardship Pharmacist Progress Note   PCP: No primary care provider on file. PCP-Cardiologist: None    HPI:  21 yoM w/ PMH of cocaine and tobacco use presenting late w/ CP on 6/8, evaluated for STEMI. Cath revealed 100% occlusion of LAD, DES placed, EF estimated 30-35% in cath, LVEDP 24. ECHO revealed EF 42% with G1DD and normal RV systolic function.  Discharge HF Medications: - Losartan 100 mg daily - Carvedilol 6.25 mg BID  Prior to admission HF Medications: - N/A  Pertinent Lab Values: Serum creatinine 1.04, BUN 18, Potassium 4.1, Sodium 134, BNP 139  Vital Signs: Weight: 174 lbs Blood pressure: 90-110/70s Heart rate: 60-70s  Medication Assistance / Insurance Benefits Check: Does the patient have prescription insurance?  No  Does the patient qualify for medication assistance through manufacturers or grants?   Yes Eligible grants and/or patient assistance programs: pending Medication assistance applications in progress: N/A  Medication assistance applications approved: N/A Approved medication assistance renewals will be completed by: pending  Outpatient Pharmacy:  Prior to admission outpatient pharmacy: [facility] Is the patient willing to use [facility] TOC pharmacy at discharge? Yes Is the patient willing to transition their outpatient pharmacy to utilize a [facility] outpatient pharmacy?   Pending    Assessment/Plan: 1) New onset CHF (EF 42%), due to ischemic CM (potential hx cocaine/tobacco use). NYHA class II symptoms. - BP down to 90-110/70s today; SCr bump from 0.66>1.04 - Continue losartan 100 mg daily at discharge - can consider optimizing to Entresto during TOC follow up  - Consider adding spironolactone and Farxiga at follow up pending SCr and BP   2) Patient assistance: - No insurance - can utilize free 30 day cards at [facility] pharmacy and then can complete long-term patient assistance applications for [place], Brilinta, and Farxiga at HF TOC follow  up. This will allow copays to be $0 per month for each medication once approved. - HF TOC appt 6/20  3)  Education  - Patient has been educated on current HF medications and potential additions to HF medication regimen.  - Patient verbalizes understanding that over the next few months, these medication doses may change and more medications may be added to optimize HF regimen - Patient has been educated on basic disease state pathophysiology and goals of therapy  Sharen Hones, PharmD, BCPS Heart Failure Stewardship Pharmacist Phone [phone]  Please check [email or web address] for unit-specific pharmacist phone numbers

## 2020-10-25 ENCOUNTER — Encounter (HOSPITAL_COMMUNITY): Payer: Self-pay

## 2020-10-26 ENCOUNTER — Other Ambulatory Visit (HOSPITAL_COMMUNITY): Payer: Self-pay

## 2020-10-28 ENCOUNTER — Telehealth (HOSPITAL_COMMUNITY): Payer: Self-pay | Admitting: Pharmacist

## 2020-10-28 NOTE — Telephone Encounter (Signed)
Third and final attempt to reach patient.

## 2020-11-02 ENCOUNTER — Emergency Department (HOSPITAL_COMMUNITY): Payer: Self-pay

## 2020-11-02 ENCOUNTER — Telehealth (HOSPITAL_COMMUNITY): Payer: Self-pay | Admitting: Surgery

## 2020-11-02 ENCOUNTER — Emergency Department (HOSPITAL_COMMUNITY)
Admission: EM | Admit: 2020-11-02 | Discharge: 2020-11-03 | Disposition: A | Discharge status: Home / Self Care | Payer: Self-pay | Attending: Emergency Medicine | Admitting: Emergency Medicine

## 2020-11-02 ENCOUNTER — Encounter (HOSPITAL_COMMUNITY): Payer: Self-pay | Admitting: Emergency Medicine

## 2020-11-02 ENCOUNTER — Encounter (HOSPITAL_COMMUNITY): Payer: Self-pay

## 2020-11-02 ENCOUNTER — Other Ambulatory Visit: Payer: Self-pay

## 2020-11-02 DIAGNOSIS — I5033 Acute on chronic diastolic (congestive) heart failure: Secondary | ICD-10-CM | POA: Insufficient documentation

## 2020-11-02 DIAGNOSIS — R682 Dry mouth, unspecified: Secondary | ICD-10-CM | POA: Insufficient documentation

## 2020-11-02 DIAGNOSIS — M549 Dorsalgia, unspecified: Secondary | ICD-10-CM | POA: Insufficient documentation

## 2020-11-02 DIAGNOSIS — Z951 Presence of aortocoronary bypass graft: Secondary | ICD-10-CM | POA: Insufficient documentation

## 2020-11-02 DIAGNOSIS — Z7902 Long term (current) use of antithrombotics/antiplatelets: Secondary | ICD-10-CM | POA: Insufficient documentation

## 2020-11-02 DIAGNOSIS — R7401 Elevation of levels of liver transaminase levels: Secondary | ICD-10-CM | POA: Insufficient documentation

## 2020-11-02 DIAGNOSIS — I25110 Atherosclerotic heart disease of native coronary artery with unstable angina pectoris: Secondary | ICD-10-CM | POA: Insufficient documentation

## 2020-11-02 DIAGNOSIS — R0789 Other chest pain: Secondary | ICD-10-CM | POA: Insufficient documentation

## 2020-11-02 DIAGNOSIS — Z79899 Other long term (current) drug therapy: Secondary | ICD-10-CM | POA: Insufficient documentation

## 2020-11-02 DIAGNOSIS — R52 Pain, unspecified: Secondary | ICD-10-CM

## 2020-11-02 DIAGNOSIS — R202 Paresthesia of skin: Secondary | ICD-10-CM | POA: Insufficient documentation

## 2020-11-02 DIAGNOSIS — R109 Unspecified abdominal pain: Secondary | ICD-10-CM | POA: Insufficient documentation

## 2020-11-02 DIAGNOSIS — Z7982 Long term (current) use of aspirin: Secondary | ICD-10-CM | POA: Insufficient documentation

## 2020-11-02 DIAGNOSIS — Z20822 Contact with and (suspected) exposure to covid-19: Secondary | ICD-10-CM | POA: Insufficient documentation

## 2020-11-02 DIAGNOSIS — F17200 Nicotine dependence, unspecified, uncomplicated: Secondary | ICD-10-CM | POA: Insufficient documentation

## 2020-11-02 DIAGNOSIS — R059 Cough, unspecified: Secondary | ICD-10-CM | POA: Insufficient documentation

## 2020-11-02 DIAGNOSIS — R079 Chest pain, unspecified: Secondary | ICD-10-CM

## 2020-11-02 DIAGNOSIS — Z7901 Long term (current) use of anticoagulants: Secondary | ICD-10-CM | POA: Insufficient documentation

## 2020-11-02 LAB — COMPREHENSIVE METABOLIC PANEL
ALT: 193 U/L — ABNORMAL HIGH (ref 0–44)
AST: 266 U/L — ABNORMAL HIGH (ref 15–41)
Albumin: 2.7 g/dL — ABNORMAL LOW (ref 3.5–5.0)
Alkaline Phosphatase: 267 U/L — ABNORMAL HIGH (ref 38–126)
Anion gap: 11 (ref 5–15)
BUN: 8 mg/dL (ref 6–20)
CO2: 20 mmol/L — ABNORMAL LOW (ref 22–32)
Calcium: 8.7 mg/dL — ABNORMAL LOW (ref 8.9–10.3)
Chloride: 100 mmol/L (ref 98–111)
Creatinine, Ser: 0.91 mg/dL (ref 0.61–1.24)
GFR, Estimated: >60 mL/min (ref >60)
Glucose, Bld: 107 mg/dL — ABNORMAL HIGH (ref 70–99)
Potassium: 3.9 mmol/L (ref 3.5–5.1)
Sodium: 131 mmol/L — ABNORMAL LOW (ref 135–145)
Total Bilirubin: 1.5 mg/dL — ABNORMAL HIGH (ref 0.3–1.2)
Total Protein: 6.4 g/dL — ABNORMAL LOW (ref 6.5–8.1)

## 2020-11-02 LAB — RESP PANEL BY RT-PCR (FLU A&B, COVID) ARPGX2
Influenza A by PCR: NEGATIVE
Influenza B by PCR: NEGATIVE
SARS Coronavirus 2 by RT PCR: NEGATIVE

## 2020-11-02 LAB — CBC
HCT: 36.2 % — ABNORMAL LOW (ref 39.0–52.0)
Hemoglobin: 12.1 g/dL — ABNORMAL LOW (ref 13.0–17.0)
MCH: 30.4 pg (ref 26.0–34.0)
MCHC: 33.4 g/dL (ref 30.0–36.0)
MCV: 91 fL (ref 80.0–100.0)
Platelets: 168 10*3/uL (ref 150–400)
RBC: 3.98 MIL/uL — ABNORMAL LOW (ref 4.22–5.81)
RDW: 11.8 % (ref 11.5–15.5)
WBC: 3.3 10*3/uL — ABNORMAL LOW (ref 4.0–10.5)
nRBC: 0 % (ref 0.0–0.2)

## 2020-11-02 LAB — TROPONIN I (HIGH SENSITIVITY)
Troponin I (High Sensitivity): 32 ng/L — ABNORMAL HIGH (ref <18)
Troponin I (High Sensitivity): 25 ng/L — ABNORMAL HIGH (ref <18)

## 2020-11-02 LAB — LIPASE, BLOOD: Lipase: 25 U/L (ref 11–51)

## 2020-11-02 MED ORDER — ONDANSETRON HCL 4 MG/2ML IJ SOLN
4.0000 mg | Freq: Once | INTRAMUSCULAR | Status: AC
  Administered 2020-11-02: 4 mg via INTRAVENOUS
  Filled 2020-11-02: qty 2

## 2020-11-02 MED ORDER — FENTANYL CITRATE (PF) 100 MCG/2ML IJ SOLN
50.0000 ug | Freq: Once | INTRAMUSCULAR | Status: AC
Start: 2020-11-02 — End: 2020-11-02
  Administered 2020-11-02: 50 ug via INTRAVENOUS
  Filled 2020-11-02: qty 2

## 2020-11-02 MED ORDER — IOHEXOL 350 MG/ML SOLN
100.0000 mL | Freq: Once | INTRAVENOUS | Status: AC | PRN
  Administered 2020-11-02: 100 mL via INTRAVENOUS

## 2020-11-02 MED ORDER — FENTANYL CITRATE (PF) 100 MCG/2ML IJ SOLN
50.0000 ug | Freq: Once | INTRAMUSCULAR | Status: AC
  Administered 2020-11-02: 50 ug via INTRAVENOUS
  Filled 2020-11-02: qty 2

## 2020-11-02 NOTE — ED Provider Notes (Signed)
[facility] EMERGENCY DEPARTMENT Provider Note   CSN: [id number] Arrival date & time: 11/02/20  1550     History Chief Complaint  Patient presents with   Chest Pain    Christopher Gibson is a 50 y.o. male.  The history is provided by the patient and medical records.  Chest Pain Christopher Gibson is a 50 y.o. male who presents to the Emergency Department complaining of chest pain.  He presents to the ED complaining of constant CP/back pain described as pressure since yesterday.  Has associated tingling in his legs/knees described as difficulty standing on them  No fever.  Has occasional cough and cotton mouth.  Has mild central abdominal pain starting today.  Has diaphoresis - started two days ago.  Has DOE.  No vomiting.  Has nausea.  No leg edema.    Recently hospitalized with STEMI - compliant with medications.    Occasional tobacco, no alcohol.  Uses heroin - snorted some 1-2 days ago for back pain.  Used cocaine about a week ago.      History reviewed. No pertinent past medical history.  Patient Active Problem List   Diagnosis Date Noted   Acute ST elevation myocardial infarction (STEMI) due to occlusion of distal portion of left anterior descending (LAD) coronary artery (HCC) 10/13/2020   ST elevation myocardial infarction (STEMI) (HCC)    Coronary artery disease involving native coronary artery of native heart with unstable angina pectoris (HCC)    Acute combined systolic and diastolic HF (heart failure), NYHA class 3 (HCC)     Past Surgical History:  Procedure Laterality Date   CORONARY/GRAFT ACUTE MI REVASCULARIZATION N/A 10/13/2020   Procedure: Coronary/Graft Acute MI Revascularization;  Surgeon: Lyn Records, MD;  Location: [facility] INVASIVE CV LAB;  Service: Cardiovascular;  Laterality: N/A;   LEFT HEART CATH AND CORONARY ANGIOGRAPHY N/A 10/13/2020   Procedure: LEFT HEART CATH AND CORONARY ANGIOGRAPHY;  Surgeon: Lyn Records, MD;  Location: [facility] INVASIVE CV  LAB;  Service: Cardiovascular;  Laterality: N/A;       No family history on file.  Social History   Tobacco Use   Smoking status: Every Day    Pack years: 0.00   Smokeless tobacco: Never    Home Medications Prior to Admission medications   Medication Sig Start Date End Date Taking? Authorizing Provider  apixaban (ELIQUIS) 5 MG TABS tablet Take 1 tablet (5 mg total) by mouth 2 (two) times daily. 10/15/20   Manson Passey, PA  aspirin 81 MG EC tablet Take 1 tablet (81 mg total) by mouth daily. 10/15/20   Bhagat, Sharrell Ku, PA  atorvastatin (LIPITOR) 80 MG tablet Take 1 tablet (80 mg total) by mouth daily. 10/16/20   Bhagat, Sharrell Ku, PA  carvedilol (COREG) 6.25 MG tablet Take 1 tablet (6.25 mg total) by mouth 2 (two) times daily with a meal. 10/15/20   Bhagat, Bhavinkumar, PA  clopidogrel (PLAVIX) 75 MG tablet Take 1 tablet (75 mg total) by mouth daily. 10/16/20   Bhagat, Sharrell Ku, PA  losartan (COZAAR) 50 MG tablet Take 2 tablets (100 mg total) by mouth daily. 10/16/20   Bhagat, Sharrell Ku, PA  nitroGLYCERIN (NITROSTAT) 0.4 MG SL tablet Place 1 tablet (0.4 mg total) under the tongue every 5 (five) minutes as needed for chest pain (CP or SOB). 10/15/20   Manson Passey, PA    Allergies    Keflex [cephalexin]  Review of Systems   Review of Systems  Cardiovascular:  Positive for chest pain.  All other systems reviewed and are negative.  Physical Exam Updated Vital Signs BP 132/83   Pulse (!) 58   Temp 99.8 F (37.7 C) (Oral)   Resp (!) 27   SpO2 99%   Physical Exam Vitals and nursing note reviewed.  Constitutional:      Appearance: He is well-developed.     Comments: Uncomfortable appearing  HENT:     Head: Normocephalic and atraumatic.  Cardiovascular:     Rate and Rhythm: Normal rate and regular rhythm.     Heart sounds: No murmur heard. Pulmonary:     Effort: Pulmonary effort is normal. No respiratory distress.     Breath sounds: Normal breath  sounds.  Abdominal:     Palpations: Abdomen is soft.     Tenderness: There is no abdominal tenderness. There is no guarding or rebound.  Musculoskeletal:        General: No swelling or tenderness.  Skin:    General: Skin is warm and dry.  Neurological:     Mental Status: He is alert and oriented to person, place, and time.  Psychiatric:        Behavior: Behavior normal.    ED Results / Procedures / Treatments   Labs (all labs ordered are listed, but only abnormal results are displayed) Labs Reviewed  CBC - Abnormal; Notable for the following components:      Result Value   WBC 3.3 (*)    RBC 3.98 (*)    Hemoglobin 12.1 (*)    HCT 36.2 (*)    All other components within normal limits  COMPREHENSIVE METABOLIC PANEL - Abnormal; Notable for the following components:   Sodium 131 (*)    CO2 20 (*)    Glucose, Bld 107 (*)    Calcium 8.7 (*)    Total Protein 6.4 (*)    Albumin 2.7 (*)    AST 266 (*)    ALT 193 (*)    Alkaline Phosphatase 267 (*)    Total Bilirubin 1.5 (*)    All other components within normal limits  TROPONIN I (HIGH SENSITIVITY) - Abnormal; Notable for the following components:   Troponin I (High Sensitivity) 32 (*)    All other components within normal limits  TROPONIN I (HIGH SENSITIVITY) - Abnormal; Notable for the following components:   Troponin I (High Sensitivity) 25 (*)    All other components within normal limits  RESP PANEL BY RT-PCR (FLU A&B, COVID) ARPGX2  LIPASE, BLOOD    EKG EKG Interpretation  Date/Time:  Tuesday November 02 2020 16:11:06 EDT Ventricular Rate:  73 PR Interval:  162 QRS Duration: 98 QT Interval:  432 QTC Calculation: 475 R Axis:   213 Text Interpretation: Normal sinus rhythm Right superior axis deviation Low voltage QRS Inferior infarct , age undetermined Cannot rule out Anteroseptal infarct , age undetermined T wave abnormality, consider lateral ischemia Abnormal ECG improved from prior Confirmed by Benjiman Core  [phone]) on 11/02/2020 4:16:44 PM  Radiology DG Chest 1 View  Result Date: 11/02/2020 CLINICAL DATA:  Chest pain cough chills EXAM: CHEST  1 VIEW COMPARISON:  10/13/2020 FINDINGS: Right lung grossly clear. Mild patchy left lung base opacity without significant change. No pleural effusion or pneumothorax. Normal cardiac size IMPRESSION: Minimal patchy atelectasis or infiltrate at the left base. This is without significant change Electronically Signed   By: Jasmine Pang M.D.   On: 11/02/2020 17:31   CT Angio Chest/Abd/Pel for Dissection W and/or W/WO  Result Date:  11/02/2020 CLINICAL DATA:  Chest pain, shortness of breath EXAM: CT ANGIOGRAPHY CHEST, ABDOMEN AND PELVIS TECHNIQUE: Non-contrast CT of the chest was initially obtained. Multidetector CT imaging through the chest, abdomen and pelvis was performed using the standard protocol during bolus administration of intravenous contrast. Multiplanar reconstructed images and MIPs were obtained and reviewed to evaluate the vascular anatomy. CONTRAST:  OMNIPAQUE IOHEXOL 350 MG/ML SOLN COMPARISON:  None. FINDINGS: CTA CHEST FINDINGS Cardiovascular: Heart is normal size. Aorta is normal caliber. No dissection. No filling defects in the pulmonary arteries to suggest pulmonary emboli. Coronary artery calcifications Mediastinum/Nodes: No mediastinal, hilar, or axillary adenopathy. Trachea and esophagus are unremarkable. Bilateral thyroid nodules the largest on the right measuring up to 1.7 cm. Lungs/Pleura: Lungs are clear. No focal airspace opacities or suspicious nodules. No effusions. Musculoskeletal: Chest wall soft tissues are unremarkable. No acute bony abnormality. Review of the MIP images confirms the above findings. CTA ABDOMEN AND PELVIS FINDINGS VASCULAR Aorta: Normal caliber aorta without aneurysm, dissection, vasculitis or significant stenosis. Celiac: Patent without evidence of aneurysm, dissection, vasculitis or significant stenosis. SMA: Patent  without evidence of aneurysm, dissection, vasculitis or significant stenosis. Renals: Both renal arteries are patent without evidence of aneurysm, dissection, vasculitis, fibromuscular dysplasia or significant stenosis. IMA: Patent without evidence of aneurysm, dissection, vasculitis or significant stenosis. Inflow: Patent without evidence of aneurysm, dissection, vasculitis or significant stenosis. Veins: No obvious venous abnormality within the limitations of this arterial phase study. Review of the MIP images confirms the above findings. NON-VASCULAR Hepatobiliary: 3 cm low-density lesion anteriorly in the left hepatic lobe which cannot be characterized on this arterial phase study. Gallbladder grossly unremarkable. Pancreas: No focal abnormality or ductal dilatation. Spleen: No focal abnormality.  Normal size. Adrenals/Urinary Tract: Small cyst in the midpole of the right kidney and lower pole of the left kidney. No stones or hydronephrosis. Adrenal glands and urinary bladder unremarkable. Stomach/Bowel: Normal appendix. Stomach, large and small bowel grossly unremarkable. Lymphatic: Bilateral inguinal adenopathy. Index left inguinal lymph node has a short axis diameter of 13 mm on image 283 of series 7. Index right inguinal lymph node has a short axis diameter of 12 mm on image 309. Adenopathy along the pelvic sidewall. Right external iliac node has a short axis diameter of 11 mm on image 257 and left external iliac lymph node has a short axis diameter of 10 mm on image 255. Shotty periaortic lymph nodes. Reproductive: No visible focal abnormality. Other: No free fluid or free air. Musculoskeletal: No acute bony abnormality. Review of the MIP images confirms the above findings. IMPRESSION: No evidence of aortic aneurysm or dissection. Adenopathy in the inguinal regions and along the pelvic sidewalls bilaterally. Cannot exclude lymphoma. Recommend close clinical follow-up and repeat imaging or biopsy for further  assessment. 3 cm left hepatic low-density lesion difficult to characterize on this arterial phase study. This could be further evaluated with ultrasound. No acute cardiopulmonary disease. Coronary artery disease. Electronically Signed   By: Charlett Nose M.D.   On: 11/02/2020 22:32   US Abdomen Limited RUQ (LIVER/GB)  Result Date: 11/02/2020 CLINICAL DATA:  Transaminitis. EXAM: ULTRASOUND ABDOMEN LIMITED RIGHT UPPER QUADRANT COMPARISON:  None. FINDINGS: Gallbladder: No gallstones are visualized. The gallbladder wall measures 4.8 mm in thickness. A trace amount of pericholecystic fluid is seen. No sonographic Murphy sign noted by sonographer. Common bile duct: Diameter: 4.2 mm Liver: A 3.0 cm x 2.5 cm x 3.7 cm well-defined hyperechoic area is seen within the right lobe of the liver.  Within normal limits in parenchymal echogenicity. Portal vein is patent on color Doppler imaging with normal direction of blood flow towards the liver. Other: None. IMPRESSION: 1. Gallbladder wall thickening and a trace amount of pericholecystic fluid without evidence of cholelithiasis or acute cholecystitis. 2. Findings likely consistent with a hepatic hemangioma. Correlation with nonemergent hepatic CT versus MRI is recommended. Electronically Signed   By: Aram Candelahaddeus  Houston M.D.   On: 11/02/2020 20:05    Procedures Procedures   Medications Ordered in ED Medications  fentaNYL (SUBLIMAZE) injection 50 mcg (50 mcg Intravenous Given 11/02/20 1918)  ondansetron (ZOFRAN) injection 4 mg (4 mg Intravenous Given 11/02/20 1918)  fentaNYL (SUBLIMAZE) injection 50 mcg (50 mcg Intravenous Given 11/02/20 2300)  iohexol (OMNIPAQUE) 350 MG/ML injection 100 mL (100 mLs Intravenous Contrast Given 11/02/20 2222)    ED Course  I have reviewed the triage vital signs and the nursing notes.  Pertinent labs & imaging results that were available during my care of the patient were reviewed by me and considered in my medical decision making (see  chart for details).    MDM Rules/Calculators/A&P                         patient with recent admission for ST elevation MI here for evaluation of 24 hours of constant chest pain, back pain, body aches. He is compliant with his medications. He does use occasional heroin and cocaine. He does not inject drugs. He has mild elevation in his transaminases. Imaging does show hemangioma. Current presentation is not consistent with hepatic abscess. He also does have some pelvic lymphadenopathy. Discussed these findings with the patient. Presentation is not consistent with pneumonia, ACS, PE, sepsis. Discussed the patient with on-call cardiologist. Troponins are down trending compared to recent hospitalization. He has outpatient cardiology follow-up tomorrow. His transaminases are mildly elevated, question if this is secondary to his statin. Discussed holding it at this time. Discussed cardiology follow-up as well as PCP follow-up and return precautions.   Final Clinical Impression(s) / ED Diagnoses Final diagnoses:  Transaminitis  Body aches    Rx / DC Orders ED Discharge Orders     None        Tilden Fossaees, Takara Sermons, MD 11/02/20 2357

## 2020-11-02 NOTE — ED Notes (Signed)
Pt is asking for nausea and pain meds. Dr.Rees notified.

## 2020-11-02 NOTE — Telephone Encounter (Signed)
I attempted to reach patient regarding his scheduled appt in the [facility].  I was unable to reach the patient and unable to leave a message.

## 2020-11-02 NOTE — ED Provider Notes (Signed)
Emergency Medicine Provider Triage Evaluation Note  Christopher Gibson , a 50 y.o. male  was evaluated in triage.  Pt complains of chest pain since yesterday. Also c/o sob, cough, chills, nv.  Review of Systems  Positive: Chest pain,  sob, cough, chills, nv Negative: diarrhea  Physical Exam  BP 131/80 (BP Location: Right Arm)   Pulse 68   Temp 99.8 F (37.7 C) (Oral)   Resp 18   SpO2 100%  Gen:   Awake, no distress   Resp:  Normal effort  MSK:   Moves extremities without difficulty  Other:  Heart rrr, abd soft and nontender  Medical Decision Making  Medically screening exam initiated at 4:24 PM.  Appropriate orders placed.  Christopher Gibson was informed that the remainder of the evaluation will be completed by another provider, this initial triage assessment does not replace that evaluation, and the importance of remaining in the ED until their evaluation is complete.     Christopher Gibson 11/02/20 1626    Christopher Sprout, MD 11/04/20 1226

## 2020-11-02 NOTE — Discharge Instructions (Addendum)
Your liver enzymes were slightly elevated today. Please hold your Lipitor. Please follow-up with your doctor for recheck of your liver enzymes into your cardiologist about medication adjustments.  Get rechecked if you have new or concerning symptoms.   Avoid Tylenol and alcohol.  you had a CT scan and ultrasound performed today. There is a spot on your liver that will need to be rechecked by your family doctor.  your CT scan also showed enlarged lymph nodes in your groin. This will need to be followed up by your family doctor for recheck.

## 2020-11-02 NOTE — Progress Notes (Signed)
[facility]  ZGY:FVCB Primary Cardiologist:Smith, Sherilyn Cooter  HPI:  Christopher Gibson is a 50 y.o.  male  with a PMH significant for cocaine use disorder, tobacco use disorder, recent late presentation of anterior STEMI   Patient reported that he used cocaine in AM of 10/13/20 after which he had chest pressure and pain.  EKG was concerning for STEMI.  HS Troponin 2,815 then >24k x3.  Received PCI to LAD as shown below, also suspicon for LV thrombus on LV gram.        ECHO followed with EF 42% by 3D echo, with severe apical hypokinesis, G1DD, mild LVH, normal RV.   Presentation was consistent with late presentation anterior STEMI.   Rec'd one dose IV lasix.  Discharged on DAPT, Eliquis, Carvedilol 6.25 BID, Losartan 100mg  daily.    Seen in the ED yesterday for body aches, chest/back pain with low troponin 25, 32.  EKG without acute ischemic changes.   He did have some mild transaminase elevation.  CT angio chest/abd/pelv negative for any dissection, some adenopathy and right lobe lesion. Abdominal negative for acute process did show likely hepatic hemangioma in the right lobe.  Lipitor held as the presumed cause of transaminitis and body aches.    Feeling better today, feeling less achy, Denies dyspnea on exertion, chest pain, orthopnea or PND.  Still had some back pain last night, it was difficult to sleep.     ROS: All systems negative except as listed in HPI, PMH and Problem List.  SH:  Social History   Socioeconomic History   Marital status: Single    Spouse name: Not on file   Number of children: Not on file   Years of education: Not on file   Highest education level: Not on file  Occupational History   Not on file  Tobacco Use   Smoking status: Every Day    Pack years: 0.00   Smokeless tobacco: Never  Substance and Sexual Activity   Alcohol use: Not on file   Drug use: Not on file   Sexual activity: Not on file  Other Topics Concern    Not on file  Social History Narrative   Not on file   Social Determinants of Health   Financial Resource Strain: High Risk   Difficulty of Paying Living Expenses: Hard  Food Insecurity: Food Insecurity Present   Worried About Running Out of Food in the Last Year: Sometimes true   Ran Out of Food in the Last Year: Sometimes true  Transportation Needs: No Transportation Needs   Lack of Transportation (Medical): No   Lack of Transportation (Non-Medical): No  Physical Activity: Not on file  Stress: Not on file  Social Connections: Not on file  Intimate Partner Violence: Not on file    FH: History reviewed. No pertinent family history.  History reviewed. No pertinent past medical history.  Current Outpatient Medications  Medication Sig Dispense Refill   nitroGLYCERIN (NITROSTAT) 0.4 MG SL tablet Place 1 tablet (0.4 mg total) under the tongue every 5 (five) minutes as needed for chest pain (CP or SOB). 100 tablet 1   apixaban (ELIQUIS) 5 MG TABS tablet Take 1 tablet (5 mg total) by mouth 2 (two) times daily. 60 tablet 2   aspirin 81 MG EC tablet Take 1 tablet (81 mg total) by mouth daily. 30 tablet 2   carvedilol (COREG) 6.25 MG tablet Take 1 tablet (6.25 mg total) by mouth 2 (two)  times daily with a meal. 60 tablet 2   rosuvastatin (CRESTOR) 20 MG tablet Take 1 tablet (20 mg total) by mouth daily. 30 tablet 2   sacubitril-valsartan (ENTRESTO) 49-51 MG Take 1 tablet by mouth 2 (two) times daily. 60 tablet 2   No current facility-administered medications for this encounter.    Vitals:   11/03/20 1117  BP: (!) 166/99  Pulse: (!) 57  SpO2: 100%  Weight: 81.7 kg (180 lb 3.2 oz)    PHYSICAL EXAM: Cardiac: JVD flat, normal rate and rhythm, clear s1 and s2, no murmurs, rubs or gallops, no LE edema Pulmonary: CTAB, not in distress Abdominal: non distended abdomen, soft and nontender Psych: Alert, conversant, in good spirits    ASSESSMENT & PLAN: Chronic Systolic  CHF -Ischemic cardiomyopathy, Cocaine use  -ECHO 10/2020 EF 42% by 3D echo, with severe apical hypokinesis, G1DD, mild LVH, normal RV -NYHA Class II, euvolemic on exam -Continue carvedilol 6.25mg  BID -Switch losartan to Entresto 49/51 BID given samples, Child psychotherapist assisting with getting him Medicaid in this state  CAD: -recent late presentation anterior STEMI, s/p DES to LAD -no s/s of chest pain -DAPT and eliquis for one month then drop asa -restart statin in one week as outlined below   LV thrombus: -continue eliquis -will need repeat imaging in the next few months   Tobacco use disorder:  -quit a few days ago   Cocaine use disorder/Opioid Use Disorder: -has used cocaine and heroin both once since discharge -went over the importance of complete cessation in great detail.   -Child psychotherapist offered assistance however he did not want to discuss this with her   Body aches, Transaminitis: -hold statin for one week then restart but with crestor instead of atorvastatin he will monitor for symptoms -repeat cmp, add ck -will need repeat LFT's at follow up visit   Follow up with [facility]

## 2020-11-02 NOTE — ED Triage Notes (Signed)
C/o chest pressure since yesterday with tingling to bilateral knees, SOB, vomited x 1, no appetite, and generalized weakness.

## 2020-11-03 ENCOUNTER — Other Ambulatory Visit (HOSPITAL_COMMUNITY): Payer: Self-pay

## 2020-11-03 ENCOUNTER — Ambulatory Visit (HOSPITAL_COMMUNITY)
Admission: RE | Admit: 2020-11-03 | Discharge: 2020-11-03 | Disposition: A | Discharge status: Home / Self Care | Payer: Self-pay | Source: Ambulatory Visit | Attending: Internal Medicine | Admitting: Internal Medicine

## 2020-11-03 ENCOUNTER — Encounter (HOSPITAL_COMMUNITY): Payer: Self-pay

## 2020-11-03 VITALS — BP 166/99 | HR 57 | Wt 180.2 lb

## 2020-11-03 DIAGNOSIS — I5022 Chronic systolic (congestive) heart failure: Secondary | ICD-10-CM | POA: Insufficient documentation

## 2020-11-03 DIAGNOSIS — Z5941 Food insecurity: Secondary | ICD-10-CM | POA: Insufficient documentation

## 2020-11-03 DIAGNOSIS — F1490 Cocaine use, unspecified, uncomplicated: Secondary | ICD-10-CM | POA: Insufficient documentation

## 2020-11-03 DIAGNOSIS — Z955 Presence of coronary angioplasty implant and graft: Secondary | ICD-10-CM | POA: Insufficient documentation

## 2020-11-03 DIAGNOSIS — I25110 Atherosclerotic heart disease of native coronary artery with unstable angina pectoris: Secondary | ICD-10-CM

## 2020-11-03 DIAGNOSIS — Z7901 Long term (current) use of anticoagulants: Secondary | ICD-10-CM | POA: Insufficient documentation

## 2020-11-03 DIAGNOSIS — G720 Drug-induced myopathy: Secondary | ICD-10-CM

## 2020-11-03 DIAGNOSIS — Z7982 Long term (current) use of aspirin: Secondary | ICD-10-CM | POA: Insufficient documentation

## 2020-11-03 DIAGNOSIS — F1190 Opioid use, unspecified, uncomplicated: Secondary | ICD-10-CM | POA: Insufficient documentation

## 2020-11-03 DIAGNOSIS — I255 Ischemic cardiomyopathy: Secondary | ICD-10-CM | POA: Insufficient documentation

## 2020-11-03 DIAGNOSIS — I5042 Chronic combined systolic (congestive) and diastolic (congestive) heart failure: Secondary | ICD-10-CM

## 2020-11-03 DIAGNOSIS — T466X5A Adverse effect of antihyperlipidemic and antiarteriosclerotic drugs, initial encounter: Secondary | ICD-10-CM

## 2020-11-03 DIAGNOSIS — I236 Thrombosis of atrium, auricular appendage, and ventricle as current complications following acute myocardial infarction: Secondary | ICD-10-CM

## 2020-11-03 DIAGNOSIS — I513 Intracardiac thrombosis, not elsewhere classified: Secondary | ICD-10-CM | POA: Insufficient documentation

## 2020-11-03 DIAGNOSIS — Z79899 Other long term (current) drug therapy: Secondary | ICD-10-CM | POA: Insufficient documentation

## 2020-11-03 DIAGNOSIS — I2510 Atherosclerotic heart disease of native coronary artery without angina pectoris: Secondary | ICD-10-CM | POA: Insufficient documentation

## 2020-11-03 DIAGNOSIS — I252 Old myocardial infarction: Secondary | ICD-10-CM | POA: Insufficient documentation

## 2020-11-03 DIAGNOSIS — Z720 Tobacco use: Secondary | ICD-10-CM | POA: Insufficient documentation

## 2020-11-03 DIAGNOSIS — F1410 Cocaine abuse, uncomplicated: Secondary | ICD-10-CM

## 2020-11-03 DIAGNOSIS — Z596 Low income: Secondary | ICD-10-CM | POA: Insufficient documentation

## 2020-11-03 DIAGNOSIS — F17200 Nicotine dependence, unspecified, uncomplicated: Secondary | ICD-10-CM

## 2020-11-03 DIAGNOSIS — R7401 Elevation of levels of liver transaminase levels: Secondary | ICD-10-CM | POA: Insufficient documentation

## 2020-11-03 DIAGNOSIS — M791 Myalgia, unspecified site: Secondary | ICD-10-CM | POA: Insufficient documentation

## 2020-11-03 LAB — COMPREHENSIVE METABOLIC PANEL
ALT: 179 U/L — ABNORMAL HIGH (ref 0–44)
AST: 215 U/L — ABNORMAL HIGH (ref 15–41)
Albumin: 2.7 g/dL — ABNORMAL LOW (ref 3.5–5.0)
Alkaline Phosphatase: 274 U/L — ABNORMAL HIGH (ref 38–126)
Anion gap: 8 (ref 5–15)
BUN: 10 mg/dL (ref 6–20)
CO2: 23 mmol/L (ref 22–32)
Calcium: 8.6 mg/dL — ABNORMAL LOW (ref 8.9–10.3)
Chloride: 98 mmol/L (ref 98–111)
Creatinine, Ser: 0.92 mg/dL (ref 0.61–1.24)
GFR, Estimated: >60 mL/min (ref >60)
Glucose, Bld: 120 mg/dL — ABNORMAL HIGH (ref 70–99)
Potassium: 4.1 mmol/L (ref 3.5–5.1)
Sodium: 129 mmol/L — ABNORMAL LOW (ref 135–145)
Total Bilirubin: 0.9 mg/dL (ref 0.3–1.2)
Total Protein: 6.7 g/dL (ref 6.5–8.1)

## 2020-11-03 LAB — CK: Total CK: 144 U/L (ref 49–397)

## 2020-11-03 MED ORDER — ASPIRIN 81 MG PO TBEC
81.0000 mg | DELAYED_RELEASE_TABLET | Freq: Every day | ORAL | 2 refills | Status: DC
  Filled 2020-11-03: qty 30, 30d supply, fill #0

## 2020-11-03 MED ORDER — ENTRESTO 49-51 MG PO TABS
1.0000 | ORAL_TABLET | Freq: Two times a day (BID) | ORAL | 2 refills | Status: DC
  Filled 2020-11-03: qty 60, 30d supply, fill #0

## 2020-11-03 MED ORDER — CARVEDILOL 6.25 MG PO TABS
6.2500 mg | ORAL_TABLET | Freq: Two times a day (BID) | ORAL | 2 refills | Status: DC
  Filled 2020-11-03 – 2020-11-16 (×2): qty 60, 30d supply, fill #0
  Filled 2020-12-15: qty 60, 30d supply, fill #1
  Filled 2021-01-11: qty 60, 30d supply, fill #0
  Filled 2021-02-11: qty 60, 30d supply, fill #1

## 2020-11-03 MED ORDER — ROSUVASTATIN CALCIUM 20 MG PO TABS
20.0000 mg | ORAL_TABLET | Freq: Every day | ORAL | 2 refills | Status: DC
  Filled 2020-11-03: qty 30, 30d supply, fill #0

## 2020-11-03 MED ORDER — APIXABAN 5 MG PO TABS
5.0000 mg | ORAL_TABLET | Freq: Two times a day (BID) | ORAL | 2 refills | Status: DC
  Filled 2020-11-03 – 2020-12-02 (×2): qty 60, 30d supply, fill #0
  Filled 2021-01-11: qty 60, 30d supply, fill #1
  Filled 2021-02-11: qty 60, 30d supply, fill #2

## 2020-11-03 MED ORDER — ENTRESTO 49-51 MG PO TABS
1.0000 | ORAL_TABLET | Freq: Two times a day (BID) | ORAL | 2 refills | Status: DC
  Filled 2020-11-03 – 2020-12-02 (×2): qty 60, 30d supply, fill #0
  Filled 2021-01-11: qty 60, 30d supply, fill #1
  Filled 2021-02-11: qty 60, 30d supply, fill #2

## 2020-11-03 MED ORDER — ROSUVASTATIN CALCIUM 20 MG PO TABS
20.0000 mg | ORAL_TABLET | Freq: Every day | ORAL | 2 refills | Status: DC
  Filled 2020-11-03: qty 30, 30d supply, fill #0
  Filled 2020-12-02: qty 30, 30d supply, fill #1
  Filled 2021-01-11: qty 30, 30d supply, fill #2

## 2020-11-03 NOTE — Patient Instructions (Addendum)
EKG done today.  Labs done today. We will contact you only if your labs are abnormal.  STOP taking Losartan   STOP taking Atorvastatin   START Entresto 49-51mg  (1 tablet) by mouth 2 times daily.  START Crestor 20mg  (1 tablet) by mouth daily. Start this on Monday July 4th.  No other medication changes were made. Please continue all current medications as prescribed.

## 2020-11-03 NOTE — Progress Notes (Signed)
Medication Samples have been provided to the patient.  Drug name: Eliquis       Strength: 5mg         Qty: 4  LOT  Exp.Date: 10/2022  Dosing instructions: take 1 tab po bid  The patient has been instructed regarding the correct time, dose, and frequency of taking this medication, including desired effects and most common side effects.   Kyri Dai R Juvon Teater 12:20 PM 11/03/2020  Medication Samples have been provided to the patient.  Drug name: 11/05/2020        Strength: 49-51mg         Qty: 2  LOTSherryll Burger  Exp.Date: 11/2022  Dosing instructions: 1 tablet po bid  The patient has been instructed regarding the correct time, dose, and frequency of taking this medication, including desired effects and most common side effects.   Aiden Helzer R Jaspal Pultz 12:21 PM 11/03/2020

## 2020-11-03 NOTE — Progress Notes (Signed)
Heart and Vascular Care Navigation  11/03/2020  Christopher Gibson 05/08/1971 [id number]  Reason for Referral: Patient seen in HF [facility] clinic.   Engaged with patient face to face for initial visit for Heart and Vascular Care Coordination.                                                                                                   Assessment:  Patient is a 50 yo male who states he has been living in [place] with his sister for 3 years now. Patient states "I needed a change in environment" and moved from his home where he shared with his wife in [place].  He reports he has been on disability since 2007 and states that he has medicaid. Patient has never changed his address officially with Social Security so it appears that he continues to have [employer] although he has no card or verification of medicaid. He stated "I think it is at my wife's house in [place]". Patient states he plans to call Social Security today and change the address so he can apply for medicaid in [place]. Patient reports he has limited financial resources due to low SSI  income but manages with the help of his sister. She drives patient to all medical appointments and assists with food and housing.                                 HRT/VAS Care Coordination     Patients Home Cardiology Office --  [facility]   Outpatient Care Team Social Worker   Social Worker Name: Lasandra Beech, [place] [phone]   Living arrangements for the past 2 months Single Family Home   Lives with: Siblings  sister and family   Patient Current Insurance Coverage --  Patient reports he has [place] but no card   Patient Has Concern With Paying Medical Bills Yes   Patient Concerns With Medical Bills Patient states [place] and needs [place] change address to apply for [place] Medicaid.   Does Patient Have Prescription Coverage? No   Patient Prescription Assistance Programs Heart Failure [facility] Assistive Devices/Equipment Scales       Social History:                                                                              SDOH Screenings   Alcohol Screen: Not on file  Depression (PHQ2-9): Not on file  Financial Resource Strain: High Risk   Difficulty of Paying Living Expenses: Hard  Food Insecurity: Food Insecurity Present   Worried About Running Out of Food in the Last Year: Sometimes true   Ran Out of Food in the Last Year: Sometimes true  Housing:  Low Risk    Last Housing Risk Score: 0  Physical Activity: Not on file  Social Connections: Not on file  Stress: Not on file  Tobacco Use: High Risk   Smoking Tobacco Use: Every Day   Smokeless Tobacco Use: Never  Transportation Needs: No Transportation Needs   Lack of Transportation (Medical): No   Lack of Transportation (Non-Medical): No    SDOH Interventions: Financial Resources:  Financial Strain Interventions: Other (Comment) (Will provide community resources for financial support)   Food Insecurity:   Patient will consider food stamp application  Housing Insecurity:   N/a  Transportation:    N/a    Other Care Navigation Interventions:     Inpatient/Outpatient Substance Abuse Counseling/Rehab Options N/a  Provided Pharmacy assistance resources Heart Failure Fund  Patient expressed Mental Health concerns No.  Patient Referred to: N/a   Follow-up plan:  CSW discussed Medicaid and the need to change address within the Social Security system so he can apply for [place] Medicaid. Patient provided contact information for Social Security and Medicaid application process. Patient given medications through Heart failure Fund to bridge the gap until medicaid in place. Patient verbalizes understanding and will contact CSW if further needs arise.

## 2020-11-04 ENCOUNTER — Other Ambulatory Visit (HOSPITAL_COMMUNITY): Payer: Self-pay

## 2020-11-04 ENCOUNTER — Other Ambulatory Visit (HOSPITAL_COMMUNITY): Payer: Self-pay | Admitting: *Deleted

## 2020-11-04 ENCOUNTER — Telehealth: Payer: Self-pay

## 2020-11-04 MED ORDER — CLOPIDOGREL BISULFATE 75 MG PO TABS
75.0000 mg | ORAL_TABLET | Freq: Every day | ORAL | 3 refills | Status: DC
  Filled 2020-11-04 – 2020-11-16 (×2): qty 30, 30d supply, fill #0
  Filled 2020-12-15: qty 30, 30d supply, fill #1
  Filled 2021-01-11: qty 30, 30d supply, fill #0
  Filled 2021-02-11: qty 30, 30d supply, fill #1

## 2020-11-04 MED ORDER — CLOPIDOGREL BISULFATE 75 MG PO TABS
75.0000 mg | ORAL_TABLET | Freq: Every day | ORAL | 3 refills | Status: DC
  Filled 2020-11-04: qty 30, 30d supply, fill #0

## 2020-11-04 NOTE — Telephone Encounter (Signed)
Paperwork signed and faxed.

## 2020-11-04 NOTE — Telephone Encounter (Signed)
**Note De-Identified  Obfuscation** A completed [employer] Pt Asst application was left at he office. I have completed the provider page and e-maied all to Dr Lonn Georgia nurse so she can obtain Dr Lonn Georgia signature, date it, and to fax to [employer] at the fax number written on the cover letter included or to place in Medical Records in the "to be faxed box" to be faxed.

## 2020-11-12 ENCOUNTER — Other Ambulatory Visit (HOSPITAL_COMMUNITY): Payer: Self-pay

## 2020-11-15 NOTE — Telephone Encounter (Signed)
Contacted [employer] patient assistance and he has been approved from 11/05/20-11/05/21.  Requested initial shipment to be set up to be delivered to patient's home. Expected delivery on Thursday via [employer] (no signature required).  Contacted patient to inform him of this update. No answer and unable to leave message on home phone or mobile phone.

## 2020-11-16 ENCOUNTER — Other Ambulatory Visit (HOSPITAL_COMMUNITY): Payer: Self-pay

## 2020-11-17 NOTE — Progress Notes (Deleted)
Patient ID: Christopher Gibson, male   DOB: 12-22-1970, 50 y.o.   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 male with hx of cocaine and tobacco abuse presented for CP evaluation and found to have late presenting STEMI.   He used cocaine in AM of 10/13/20 after which he had chest pressure and pain. He waited it out; then ultimately came to the ED in the evening. EKG was concerning for STEMI; chest pain was ongoing. Hence STEMI was activated from the ED. No hx of DM or HTN. Troponin were very high in the ED. Mild SOB. Smokes regularly. Nitro and Aspirin were given in the ED.   1. Late presenting acute anterior STEMI: -  Onset of chest pain 18 hours prior to presentation. Q waves anteriorly. Troponin > 24K. On cath has anteroapical dyskinesis. S/p DES of mid LAD.  -Continue DAPT for 1 month (Aspirin and Plavix given need for anticoagulation)>> and then stop ASA -Continue Atorvastatin - LVEF 42% by Echo -Cocaine and smoking cessation -Continue Coreg and losartan  - Ambulated well without recurrent chest pain    2. Acute systolic CHF. -LVEDP 24-at cath. Given lasix x 1. Lungs clear. Appears euvolemic -EF 42% by Echo -Up-titrated Coreg and ARB. -Worried about compliance>>> can consider changing losartan to Entresto and adding Spironolactone and Farxiga in outpatient setting (will wait today given soft blood pressure last night and little bump in Scr>> Has  follow up with TOC Heart Failure clinic on 6/20 (will need medications assistance). Patient reports he stays in [place] most of the times -Recommended low sodium diet    3. Tobacco abuse - Recommended cessation.   4. Cocaine abuse - Recommended cessation.   5. Hypercholesterolemia - 10/12/2020: Cholesterol 159; HDL 44; LDL Cholesterol 98; Triglycerides 87; VLDL 17  - Continue high intensity statin - Consider OP f/u labs 6-8 weeks given statin initiation this admission.    6. HTN - Now stable. Continue current dose of Coreg and Losartan    7. LV thrombus - Small by echo.  Transitioned IV heparin to Eliquis.   8. Poor social situation - No insurance. High risk for noncompliance. Case management and TOC clinic to assist. Discussed importance of medications and abstinence from poly substance abuse.   ED visit 6/28: patient with recent admission for ST elevation MI here for evaluation of 24 hours of constant chest pain, back pain, body aches. He is compliant with his medications. He does use occasional heroin and cocaine. He does not inject drugs. He has mild elevation in his transaminases. Imaging does show hemangioma. Current presentation is not consistent with hepatic abscess. He also does have some pelvic lymphadenopathy. Discussed these findings with the patient. Presentation is not consistent with pneumonia, ACS, PE, sepsis. Discussed the patient with on-call cardiologist. Troponins are down trending compared to recent hospitalization. He has outpatient cardiology follow-up tomorrow. His transaminases are mildly elevated, question if this is secondary to his  statin. Discussed holding it at this time. Discussed cardiology follow-up as well as PCP follow-up and return precautions.

## 2020-11-18 ENCOUNTER — Encounter (HOSPITAL_COMMUNITY): Payer: Self-pay | Admitting: Emergency Medicine

## 2020-11-18 ENCOUNTER — Emergency Department (HOSPITAL_COMMUNITY)
Admission: EM | Admit: 2020-11-18 | Discharge: 2020-11-18 | Disposition: A | Discharge status: Home / Self Care | Payer: Self-pay | Attending: Emergency Medicine | Admitting: Emergency Medicine

## 2020-11-18 ENCOUNTER — Other Ambulatory Visit: Payer: Self-pay

## 2020-11-18 ENCOUNTER — Emergency Department (HOSPITAL_COMMUNITY): Payer: Self-pay

## 2020-11-18 ENCOUNTER — Ambulatory Visit (INDEPENDENT_AMBULATORY_CARE_PROVIDER_SITE_OTHER): Payer: Self-pay | Admitting: Physician Assistant

## 2020-11-18 ENCOUNTER — Inpatient Hospital Stay: Payer: Self-pay | Admitting: Physician Assistant

## 2020-11-18 VITALS — BP 103/73 | HR 85 | Temp 97.9°F | Resp 16

## 2020-11-18 DIAGNOSIS — F17200 Nicotine dependence, unspecified, uncomplicated: Secondary | ICD-10-CM | POA: Insufficient documentation

## 2020-11-18 DIAGNOSIS — I5041 Acute combined systolic (congestive) and diastolic (congestive) heart failure: Secondary | ICD-10-CM | POA: Insufficient documentation

## 2020-11-18 DIAGNOSIS — Z951 Presence of aortocoronary bypass graft: Secondary | ICD-10-CM | POA: Insufficient documentation

## 2020-11-18 DIAGNOSIS — F1110 Opioid abuse, uncomplicated: Secondary | ICD-10-CM | POA: Insufficient documentation

## 2020-11-18 DIAGNOSIS — Z79899 Other long term (current) drug therapy: Secondary | ICD-10-CM | POA: Insufficient documentation

## 2020-11-18 DIAGNOSIS — I213 ST elevation (STEMI) myocardial infarction of unspecified site: Secondary | ICD-10-CM

## 2020-11-18 DIAGNOSIS — Y900 Blood alcohol level of less than 20 mg/100 ml: Secondary | ICD-10-CM | POA: Insufficient documentation

## 2020-11-18 DIAGNOSIS — R569 Unspecified convulsions: Secondary | ICD-10-CM | POA: Insufficient documentation

## 2020-11-18 DIAGNOSIS — Z7902 Long term (current) use of antithrombotics/antiplatelets: Secondary | ICD-10-CM | POA: Insufficient documentation

## 2020-11-18 DIAGNOSIS — I25110 Atherosclerotic heart disease of native coronary artery with unstable angina pectoris: Secondary | ICD-10-CM | POA: Insufficient documentation

## 2020-11-18 DIAGNOSIS — Z7982 Long term (current) use of aspirin: Secondary | ICD-10-CM | POA: Insufficient documentation

## 2020-11-18 DIAGNOSIS — Z7901 Long term (current) use of anticoagulants: Secondary | ICD-10-CM | POA: Insufficient documentation

## 2020-11-18 LAB — RAPID URINE DRUG SCREEN, HOSP PERFORMED
Amphetamines: NOT DETECTED
Barbiturates: NOT DETECTED
Benzodiazepines: NOT DETECTED
Cocaine: NOT DETECTED
Opiates: POSITIVE — AB
Tetrahydrocannabinol: POSITIVE — AB

## 2020-11-18 LAB — CBC WITH DIFFERENTIAL/PLATELET
Abs Immature Granulocytes: 0 10*3/uL (ref 0.00–0.07)
Basophils Absolute: 0 10*3/uL (ref 0.0–0.1)
Basophils Relative: 0 %
Eosinophils Absolute: 0 10*3/uL (ref 0.0–0.5)
Eosinophils Relative: 0 %
HCT: 36.7 % — ABNORMAL LOW (ref 39.0–52.0)
Hemoglobin: 11.6 g/dL — ABNORMAL LOW (ref 13.0–17.0)
Lymphocytes Relative: 41 %
Lymphs Abs: 1.4 10*3/uL (ref 0.7–4.0)
MCH: 30.1 pg (ref 26.0–34.0)
MCHC: 31.6 g/dL (ref 30.0–36.0)
MCV: 95.1 fL (ref 80.0–100.0)
Monocytes Absolute: 0.4 10*3/uL (ref 0.1–1.0)
Monocytes Relative: 11 %
Neutro Abs: 1.6 10*3/uL — ABNORMAL LOW (ref 1.7–7.7)
Neutrophils Relative %: 48 %
Platelets: 183 10*3/uL (ref 150–400)
RBC: 3.86 MIL/uL — ABNORMAL LOW (ref 4.22–5.81)
RDW: 13 % (ref 11.5–15.5)
WBC: 3.4 10*3/uL — ABNORMAL LOW (ref 4.0–10.5)
nRBC: 0 % (ref 0.0–0.2)
nRBC: 0 /100 WBC

## 2020-11-18 LAB — URINALYSIS, ROUTINE W REFLEX MICROSCOPIC
Bilirubin Urine: NEGATIVE
Glucose, UA: NEGATIVE mg/dL
Hgb urine dipstick: NEGATIVE
Ketones, ur: NEGATIVE mg/dL
Leukocytes,Ua: NEGATIVE
Nitrite: NEGATIVE
Protein, ur: NEGATIVE mg/dL
Specific Gravity, Urine: <1.005 — ABNORMAL LOW (ref 1.005–1.030)
pH: 7 (ref 5.0–8.0)

## 2020-11-18 LAB — TROPONIN I (HIGH SENSITIVITY)
Troponin I (High Sensitivity): 14 ng/L (ref <18)
Troponin I (High Sensitivity): 14 ng/L (ref <18)

## 2020-11-18 LAB — COMPREHENSIVE METABOLIC PANEL
ALT: 19 U/L (ref 0–44)
AST: 24 U/L (ref 15–41)
Albumin: 3.1 g/dL — ABNORMAL LOW (ref 3.5–5.0)
Alkaline Phosphatase: 110 U/L (ref 38–126)
Anion gap: 9 (ref 5–15)
BUN: 7 mg/dL (ref 6–20)
CO2: 24 mmol/L (ref 22–32)
Calcium: 9 mg/dL (ref 8.9–10.3)
Chloride: 102 mmol/L (ref 98–111)
Creatinine, Ser: 0.75 mg/dL (ref 0.61–1.24)
GFR, Estimated: >60 mL/min (ref >60)
Glucose, Bld: 114 mg/dL — ABNORMAL HIGH (ref 70–99)
Potassium: 3.8 mmol/L (ref 3.5–5.1)
Sodium: 135 mmol/L (ref 135–145)
Total Bilirubin: 0.7 mg/dL (ref 0.3–1.2)
Total Protein: 7.1 g/dL (ref 6.5–8.1)

## 2020-11-18 LAB — MAGNESIUM: Magnesium: 1.7 mg/dL (ref 1.7–2.4)

## 2020-11-18 LAB — ETHANOL: Alcohol, Ethyl (B): <10 mg/dL (ref <10)

## 2020-11-18 LAB — GLUCOSE, POCT (MANUAL RESULT ENTRY): POC Glucose: 117 mg/dL — AB (ref 70–99)

## 2020-11-18 MED ORDER — SODIUM CHLORIDE 0.9 % IV BOLUS
1000.0000 mL | Freq: Once | INTRAVENOUS | Status: AC
  Administered 2020-11-18: 1000 mL via INTRAVENOUS

## 2020-11-18 MED ORDER — SODIUM CHLORIDE 0.9 % IV SOLN: INTRAVENOUS | Status: DC

## 2020-11-18 NOTE — ED Triage Notes (Signed)
Per [facility] pt coming from [facility]- called EMS due to patient "unresponsive" for 3-4 seconds. Patient states he fell asleep and jerks in his sleep. States he went for a follow up appt after cardiac cath. Reports heroin use today. Pt denies any complaints at this time.

## 2020-11-18 NOTE — Patient Instructions (Signed)
Glucose 117

## 2020-11-18 NOTE — Progress Notes (Signed)
Pt presents for HFU from 10/12/20, stent placement pt has no other concerns today

## 2020-11-18 NOTE — Progress Notes (Signed)
Patient ID: Christopher Gibson, male   DOB: Mar 27, 1971, 50 y.o.   MRN: [id number]   Christopher Gibson, is a 50 y.o. male  JQB:[id number]  [id number]:[id number]  DOB - [birth year]  Subjective:  Chief Complaint and HPI: Christopher Gibson is a 50 y.o. male here today to establish care and for a follow up visit after being hospitalized for STEMI precipitated by cocaine abuse 6/7-6/10.  Patient was speaking clearly.  Denied CP/SOB.  Then immediately his body started seizure-like/convulsive like activity and his eyes were rolling back into his head. He was struggling to breathe and was profusely salivating with the sound of choking.  He was unable to respond to any attempts at calling his name.  I alerted the nurse to call 911-this pattern continued about 45 seconds and he slumped over for another 20-30seconds and was unresponsive.  Then suddenly came to and said "I must have fallen asleep."  He was diaphoretic and had no realization of what had just happened.  He did admit to me he had snorted some heroin about 1 hour prior to his visit bc his back hurt.  He had constant supervision until EMS arrived  ED/Hospital notes reviewed.    ROS:   Constitutional:  No f/c, No night sweats, No unexplained weight loss. EENT:  No vision changes, No blurry vision, No hearing changes. No mouth, throat, or ear problems.  Respiratory: No cough, No SOB Cardiac: No CP, no palpitations GI:  No abd pain, No N/V/D. GU: No Urinary s/sx Musculoskeletal: No joint pain Neuro: No headache, no dizziness, no motor weakness.  Skin: No rash Endocrine:  No polydipsia. No polyuria.  Psych: Denies SI/HI  No problems updated.  ALLERGIES: Allergies  Allergen Reactions   Keflex [Cephalexin]     PAST MEDICAL HISTORY: No past medical history on file.  MEDICATIONS AT HOME: Prior to Admission medications   Medication Sig Start Date End Date Taking? Authorizing Provider  apixaban (ELIQUIS) 5 MG TABS tablet Take 1 tablet (5 mg total) by mouth  2 (two) times daily. 11/03/20  Yes Angelita Ingles, MD  aspirin 81 MG EC tablet Take 1 tablet (81 mg total) by mouth daily. 11/03/20  Yes Angelita Ingles, MD  carvedilol (COREG) 6.25 MG tablet Take 1 tablet (6.25 mg total) by mouth 2 (two) times daily with a meal. 11/03/20  Yes Winfrey, Kimberlee Nearing, MD  clopidogrel (PLAVIX) 75 MG tablet Take 1 tablet (75 mg total) by mouth daily. 11/04/20  Yes Angelita Ingles, MD  nitroGLYCERIN (NITROSTAT) 0.4 MG SL tablet Place 1 tablet (0.4 mg total) under the tongue every 5 (five) minutes as needed for chest pain (CP or SOB). 10/15/20  Yes Bhagat, Bhavinkumar, PA  rosuvastatin (CRESTOR) 20 MG tablet Take 1 tablet (20 mg total) by mouth daily. 11/03/20  Yes Winfrey, Kimberlee Nearing, MD  sacubitril-valsartan (ENTRESTO) 49-51 MG Take 1 tablet by mouth 2 (two) times daily. 11/03/20  Yes Angelita Ingles, MD     Objective:  EXAM:   Vitals:   11/18/20 1123  BP: 103/73  Pulse: 85  Resp: 16  Temp: 97.9 F (36.6 C)  SpO2: 98%    General appearance : A&OX3. NAD. Non-toxic-appearing see above HEENT: Atraumatic and Normocephalic.  PERRLA.  Chest/Lungs:  Breathing-non-labored, Good air entry bilaterally, breath sounds normal without rales, rhonchi, or wheezing  CVS: S1 S2 regular, no murmurs, gallops, rubs  Extremities: Bilateral Lower Ext shows no edema, both legs are warm to touch with = pulse throughout Neurology:  CN II-XII grossly intact, Non focal.   Skin:  No Rash  Data Review Lab Results  Component Value Date   HGBA1C 5.8 (H) 10/12/2020     Assessment & Plan   1. ST elevation myocardial infarction (STEMI), unspecified artery (HCC) - EKG 12-Lead No acute ST elevation; T waves flipped in lateral leads  2. Convulsions, unspecified convulsion type (HCC) To ED via EMS-unsure if he had a seizure, drug reaction, or he is subject to another cardiac event - Glucose (CBG)     Patient have been counseled extensively about nutrition and  exercise  Return for sent out via EMS.  The patient was given clear instructions to go to ER or return to medical center if symptoms don't improve, worsen or new problems develop. The patient verbalized understanding. The patient was told to call to get lab results if they haven't heard anything in the next week.     Georgian Co, PA-C [facility] [place], [place] [phone]   11/18/2020, 12:18 PM

## 2020-11-18 NOTE — Discharge Instructions (Addendum)
Do not use cocaine/heroin.

## 2020-11-18 NOTE — ED Provider Notes (Signed)
[facility] EMERGENCY DEPARTMENT Provider Note   CSN: [id number] Arrival date & time: 11/18/20  1233     History No chief complaint on file.   Christopher Gibson is a 50 y.o. male.  Pt presents to the ED today with a possible seizure.  Pt was admitted last month for a STEMI from cocaine abuse.  He had a LAD occlusion and a LV thrombus.  He had a stent placed to his LAD.  He had an appointment today to establish primary care.  Shortly after arrival, he had a seizure like activity.  Sx lasted for about 45 seconds and it took about 30 seconds before he came to.  He thought he fell asleep.  He does admit that he snorted heroin about 1 hr pta.  Pt said he feels fine now.  He did not want to come to the ED, but agreed to come.          History reviewed. No pertinent past medical history.  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  Social History   Tobacco Use   Smoking status: Every Day   Smokeless tobacco: Never    Home Medications Prior to  Admission medications   Medication Sig Start Date End Date Taking? Authorizing Provider  apixaban (ELIQUIS) 5 MG TABS tablet Take 1 tablet (5 mg total) by mouth 2 (two) times daily. 11/03/20  Yes Angelita Ingles, MD  carvedilol (COREG) 6.25 MG tablet Take 1 tablet (6.25 mg total) by mouth 2 (two) times daily with a meal. 11/03/20  Yes Winfrey, Kimberlee Nearing, MD  clopidogrel (PLAVIX) 75 MG tablet Take 1 tablet (75 mg total) by mouth daily. 11/04/20  Yes Angelita Ingles, MD  nitroGLYCERIN (NITROSTAT) 0.4 MG SL tablet Place 1 tablet (0.4 mg total) under the tongue every 5 (five) minutes as needed for chest pain (CP or SOB). 10/15/20  Yes Bhagat, Bhavinkumar, PA  rosuvastatin (CRESTOR) 20 MG tablet Take 1 tablet (20 mg total) by mouth daily. 11/03/20  Yes Winfrey, Kimberlee Nearing, MD  sacubitril-valsartan (ENTRESTO) 49-51 MG Take 1 tablet by mouth 2 (two) times daily. 11/03/20  Yes Angelita Ingles, MD  aspirin 81 MG EC tablet Take 1 tablet (81 mg total) by mouth daily. Patient not taking: Reported on 11/18/2020 11/03/20   Angelita Ingles, MD    Allergies    Keflex [cephalexin]  Review of Systems   Review of Systems  Neurological:  Positive for syncope.  All other systems reviewed and are negative.  Physical Exam Updated Vital Signs BP (!) 149/89  Pulse 72   Temp 98.6 F (37 C) (Oral)   Resp 14   Ht 5\' 6"  (1.676 m)   Wt 77.1 kg   SpO2 100%   BMI 27.44 kg/m   Physical Exam Vitals and nursing note reviewed.  Constitutional:      Appearance: Normal appearance.  HENT:     Head: Normocephalic and atraumatic.     Right Ear: External ear normal.     Left Ear: External ear normal.     Nose: Nose normal.     Mouth/Throat:     Mouth: Mucous membranes are moist.     Pharynx: Oropharynx is clear.  Eyes:     Extraocular Movements: Extraocular movements intact.     Conjunctiva/sclera: Conjunctivae normal.     Pupils: Pupils are equal, round, and reactive to light.  Cardiovascular:     Rate  and Rhythm: Normal rate and regular rhythm.     Pulses: Normal pulses.     Heart sounds: Normal heart sounds.  Pulmonary:     Effort: Pulmonary effort is normal.     Breath sounds: Normal breath sounds.  Abdominal:     General: Abdomen is flat. Bowel sounds are normal.     Palpations: Abdomen is soft.  Musculoskeletal:        General: Normal range of motion.     Cervical back: Normal range of motion and neck supple.  Skin:    General: Skin is warm.     Capillary Refill: Capillary refill takes less than 2 seconds.  Neurological:     General: No focal deficit present.     Mental Status: He is alert and oriented to person, place, and time.  Psychiatric:        Mood and Affect: Mood normal.        Behavior: Behavior normal.        Thought Content: Thought content normal.        Judgment: Judgment normal.    ED Results / Procedures / Treatments   Labs (all labs ordered are listed, but only abnormal results are displayed) Labs Reviewed  CBC WITH DIFFERENTIAL/PLATELET - Abnormal; Notable for the following components:      Result Value   WBC 3.4 (*)    RBC 3.86 (*)    Hemoglobin 11.6 (*)    HCT 36.7 (*)    Neutro Abs 1.6 (*)    All other components within normal limits  COMPREHENSIVE METABOLIC PANEL - Abnormal; Notable for the following components:   Glucose, Bld 114 (*)    Albumin 3.1 (*)    All other components within normal limits  MAGNESIUM  ETHANOL  URINALYSIS, ROUTINE W REFLEX MICROSCOPIC  RAPID URINE DRUG SCREEN, HOSP PERFORMED  CBG MONITORING, ED  TROPONIN I (HIGH SENSITIVITY)  TROPONIN I (HIGH SENSITIVITY)    EKG EKG Interpretation  Date/Time:  Thursday November 18 2020 12:45:24 EDT Ventricular Rate:  69 PR Interval:  175 QRS Duration: 107 QT Interval:  457 QTC Calculation: 490 R Axis:   164 Text Interpretation: Sinus rhythm Anterolateral infarct, age indeterminate Abnormal T, consider ischemia, inferior leads No significant change since last tracing Confirmed  by [phone] [id number]) on 11/18/2020 1:13:27 PM  Radiology CT Head Wo Contrast  Result Date: 11/18/2020 CLINICAL DATA:  Provided history: Mental status change, unknown cause. Additional provided: Patient reportedly "unresponsive" for 3-4 seconds. EXAM: CT HEAD WITHOUT CONTRAST TECHNIQUE: Contiguous axial images were obtained from the base of the skull through the vertex without intravenous  contrast. COMPARISON:  No pertinent prior exams available for comparison. FINDINGS: Brain: Cerebral volume is normal. There is no acute intracranial hemorrhage. No demarcated cortical infarct. No extra-axial fluid collection. No evidence of an intracranial mass. No midline shift. Partially empty sella turcica. Vascular: No hyperdense vessel.  Atherosclerotic calcifications Skull: Normal. Negative for fracture or focal lesion. Sinuses/Orbits: Visualized orbits show no acute finding. Partially imaged left maxillary sinus mucous retention cyst. IMPRESSION: No evidence of acute intracranial abnormality. Left maxillary sinus mucous retention cyst, partially imaged. Electronically Signed   By: Jackey Loge DO   On: 11/18/2020 15:13   DG Chest Port 1 View  Result Date: 11/18/2020 CLINICAL DATA:  Altered mental status. EXAM: PORTABLE CHEST 1 VIEW COMPARISON:  11/02/2020 FINDINGS: The heart is normal in size. The mediastinal and hilar contours are within normal limits. The lungs are clear of an acute process. No pulmonary lesions or pleural effusions. The bony thorax is intact. IMPRESSION: No acute cardiopulmonary findings. Electronically Signed   By: Rudie Meyer M.D.   On: 11/18/2020 13:25    Procedures Procedures   Medications Ordered in ED Medications  sodium chloride 0.9 % bolus 1,000 mL (1,000 mLs Intravenous New Bag/Given 11/18/20 1315)    And  0.9 %  sodium chloride infusion ( Intravenous New Bag/Given 11/18/20 1316)    ED Course  I have reviewed the triage vital signs and the nursing notes.  Pertinent  labs & imaging results that were available during my care of the patient were reviewed by me and considered in my medical decision making (see chart for details).    MDM Rules/Calculators/A&P                         He has been watched for several hours and has remained stable.  Sx likely due to heroin use right before he went to his doctor's office.  Pt is encouraged to avoid heroin/cocaine.  He is to f/u with his pcp.  Final Clinical Impression(s) / ED Diagnoses Final diagnoses:  Heroin abuse [facility])    Rx / DC Orders ED Discharge Orders     None        Jacalyn Lefevre, MD 11/18/20 1533

## 2020-11-23 ENCOUNTER — Ambulatory Visit: Payer: Self-pay | Admitting: Cardiology

## 2020-12-02 ENCOUNTER — Other Ambulatory Visit (HOSPITAL_COMMUNITY): Payer: Self-pay

## 2020-12-09 ENCOUNTER — Encounter (HOSPITAL_COMMUNITY): Payer: Self-pay

## 2020-12-09 ENCOUNTER — Telehealth (HOSPITAL_COMMUNITY): Payer: Self-pay

## 2020-12-09 NOTE — Telephone Encounter (Signed)
Attempted to call patient in regards to Cardiac Rehab - LM on VM Mailed letter 

## 2020-12-15 ENCOUNTER — Other Ambulatory Visit (HOSPITAL_COMMUNITY): Payer: Self-pay

## 2020-12-16 ENCOUNTER — Other Ambulatory Visit (HOSPITAL_COMMUNITY): Payer: Self-pay

## 2020-12-27 NOTE — Telephone Encounter (Signed)
Pt niece called and wanted to schedule pt for cardiac rehab. I advised pt niece that pt did not have a DPR on file so I am unable to speak with anyone but pt about his medical files and appts. Pt stated for me to hold on so she could get him on the other line and the call disconnected.

## 2020-12-28 NOTE — Telephone Encounter (Signed)
Pt called and stated that I am able to speak with his niece, I asked pt did he have insurance pt stated that he has medicaid. I advised pt that we needed his medicaid information to put it in the system. Pt stated that his niece had his information. I called his niece and she stated that she was at work at the moment and didn't have his information handy and that she would call back with his insurance information for cardiac rehab.

## 2021-01-03 ENCOUNTER — Other Ambulatory Visit (HOSPITAL_COMMUNITY): Payer: Self-pay

## 2021-01-11 ENCOUNTER — Other Ambulatory Visit (HOSPITAL_COMMUNITY): Payer: Self-pay

## 2021-01-26 ENCOUNTER — Other Ambulatory Visit (HOSPITAL_COMMUNITY): Payer: Self-pay

## 2021-01-28 ENCOUNTER — Telehealth (HOSPITAL_COMMUNITY): Payer: Self-pay

## 2021-01-28 ENCOUNTER — Encounter (HOSPITAL_COMMUNITY): Payer: Self-pay

## 2021-01-28 NOTE — Telephone Encounter (Signed)
Attempted to call patient in regards to Cardiac Rehab - LM on VM Mailed letter 

## 2021-02-11 ENCOUNTER — Other Ambulatory Visit (HOSPITAL_COMMUNITY): Payer: Self-pay | Admitting: Internal Medicine

## 2021-02-11 ENCOUNTER — Other Ambulatory Visit (HOSPITAL_COMMUNITY): Payer: Self-pay

## 2021-02-11 NOTE — Telephone Encounter (Signed)
Patient is currently not a chf patient, patient was seen in the transition of care clinic due to recent d/c from hospital. Patient will not follow up with our office, please fill.

## 2021-02-11 NOTE — Telephone Encounter (Signed)
This is a CHF pt 

## 2021-02-14 ENCOUNTER — Other Ambulatory Visit (HOSPITAL_COMMUNITY): Payer: Self-pay

## 2021-02-14 MED ORDER — ROSUVASTATIN CALCIUM 20 MG PO TABS
20.0000 mg | ORAL_TABLET | Freq: Every day | ORAL | 0 refills | Status: DC
  Filled 2021-02-14: qty 90, 90d supply, fill #0

## 2021-02-14 NOTE — Telephone Encounter (Signed)
Ok to give a 90 day supply with no refills with a note to keep appt.

## 2021-02-14 NOTE — Telephone Encounter (Signed)
Pt has an upcoming appt in November, would Dr. Katrinka Blazing like to refill rosuvastatin until appt in November with Belva Agee, please address.

## 2021-02-21 ENCOUNTER — Telehealth (HOSPITAL_COMMUNITY): Payer: Self-pay

## 2021-02-21 NOTE — Telephone Encounter (Signed)
No repsonse from pt.  Closed referral  

## 2021-02-22 ENCOUNTER — Other Ambulatory Visit (HOSPITAL_COMMUNITY): Payer: Self-pay

## 2021-03-17 ENCOUNTER — Inpatient Hospital Stay (HOSPITAL_COMMUNITY)
Admission: EM | Admit: 2021-03-17 | Discharge: 2021-03-19 | DRG: 303 | Disposition: A | Discharge status: Home / Self Care | Payer: Self-pay | Attending: Family Medicine | Admitting: Family Medicine

## 2021-03-17 ENCOUNTER — Emergency Department (HOSPITAL_COMMUNITY): Payer: Self-pay

## 2021-03-17 ENCOUNTER — Encounter (HOSPITAL_COMMUNITY): Payer: Self-pay

## 2021-03-17 ENCOUNTER — Other Ambulatory Visit: Payer: Self-pay

## 2021-03-17 DIAGNOSIS — Z7151 Drug abuse counseling and surveillance of drug abuser: Secondary | ICD-10-CM

## 2021-03-17 DIAGNOSIS — Z955 Presence of coronary angioplasty implant and graft: Secondary | ICD-10-CM

## 2021-03-17 DIAGNOSIS — Z20822 Contact with and (suspected) exposure to covid-19: Secondary | ICD-10-CM | POA: Diagnosis present

## 2021-03-17 DIAGNOSIS — I255 Ischemic cardiomyopathy: Secondary | ICD-10-CM | POA: Diagnosis present

## 2021-03-17 DIAGNOSIS — Z7901 Long term (current) use of anticoagulants: Secondary | ICD-10-CM

## 2021-03-17 DIAGNOSIS — J438 Other emphysema: Secondary | ICD-10-CM | POA: Diagnosis present

## 2021-03-17 DIAGNOSIS — Z9114 Patient's other noncompliance with medication regimen: Secondary | ICD-10-CM

## 2021-03-17 DIAGNOSIS — Z881 Allergy status to other antibiotic agents status: Secondary | ICD-10-CM

## 2021-03-17 DIAGNOSIS — I5042 Chronic combined systolic (congestive) and diastolic (congestive) heart failure: Secondary | ICD-10-CM | POA: Diagnosis present

## 2021-03-17 DIAGNOSIS — I110 Hypertensive heart disease with heart failure: Secondary | ICD-10-CM | POA: Diagnosis present

## 2021-03-17 DIAGNOSIS — Z91128 Patient's intentional underdosing of medication regimen for other reason: Secondary | ICD-10-CM

## 2021-03-17 DIAGNOSIS — R079 Chest pain, unspecified: Secondary | ICD-10-CM | POA: Diagnosis present

## 2021-03-17 DIAGNOSIS — F1410 Cocaine abuse, uncomplicated: Secondary | ICD-10-CM | POA: Diagnosis present

## 2021-03-17 DIAGNOSIS — T45526A Underdosing of antithrombotic drugs, initial encounter: Secondary | ICD-10-CM | POA: Diagnosis present

## 2021-03-17 DIAGNOSIS — E785 Hyperlipidemia, unspecified: Secondary | ICD-10-CM | POA: Diagnosis present

## 2021-03-17 DIAGNOSIS — I25111 Atherosclerotic heart disease of native coronary artery with angina pectoris with documented spasm: Principal | ICD-10-CM | POA: Diagnosis present

## 2021-03-17 DIAGNOSIS — I160 Hypertensive urgency: Secondary | ICD-10-CM | POA: Diagnosis present

## 2021-03-17 DIAGNOSIS — F1490 Cocaine use, unspecified, uncomplicated: Secondary | ICD-10-CM

## 2021-03-17 DIAGNOSIS — R778 Other specified abnormalities of plasma proteins: Secondary | ICD-10-CM | POA: Diagnosis present

## 2021-03-17 DIAGNOSIS — Z79899 Other long term (current) drug therapy: Secondary | ICD-10-CM

## 2021-03-17 DIAGNOSIS — F1110 Opioid abuse, uncomplicated: Secondary | ICD-10-CM | POA: Diagnosis present

## 2021-03-17 DIAGNOSIS — Z7902 Long term (current) use of antithrombotics/antiplatelets: Secondary | ICD-10-CM

## 2021-03-17 DIAGNOSIS — I2510 Atherosclerotic heart disease of native coronary artery without angina pectoris: Secondary | ICD-10-CM | POA: Diagnosis present

## 2021-03-17 DIAGNOSIS — F17210 Nicotine dependence, cigarettes, uncomplicated: Secondary | ICD-10-CM | POA: Diagnosis present

## 2021-03-17 DIAGNOSIS — F1190 Opioid use, unspecified, uncomplicated: Secondary | ICD-10-CM

## 2021-03-17 DIAGNOSIS — I10 Essential (primary) hypertension: Secondary | ICD-10-CM | POA: Diagnosis present

## 2021-03-17 DIAGNOSIS — I252 Old myocardial infarction: Secondary | ICD-10-CM

## 2021-03-17 HISTORY — DX: Cocaine abuse, uncomplicated: F14.10

## 2021-03-17 HISTORY — DX: Essential (primary) hypertension: I10

## 2021-03-17 HISTORY — DX: Atherosclerotic heart disease of native coronary artery without angina pectoris: I25.10

## 2021-03-17 HISTORY — DX: Chronic combined systolic (congestive) and diastolic (congestive) heart failure: I50.42

## 2021-03-17 HISTORY — DX: Opioid abuse, uncomplicated: F11.10

## 2021-03-17 HISTORY — DX: Peripheral vascular angioplasty status with implants and grafts: Z95.820

## 2021-03-17 LAB — COMPREHENSIVE METABOLIC PANEL
ALT: 10 U/L (ref 0–44)
AST: 15 U/L (ref 15–41)
Albumin: 3.7 g/dL (ref 3.5–5.0)
Alkaline Phosphatase: 56 U/L (ref 38–126)
Anion gap: 13 (ref 5–15)
BUN: 16 mg/dL (ref 6–20)
CO2: 20 mmol/L — ABNORMAL LOW (ref 22–32)
Calcium: 9.5 mg/dL (ref 8.9–10.3)
Chloride: 101 mmol/L (ref 98–111)
Creatinine, Ser: 0.69 mg/dL (ref 0.61–1.24)
GFR, Estimated: >60 mL/min (ref >60)
Glucose, Bld: 125 mg/dL — ABNORMAL HIGH (ref 70–99)
Potassium: 3.4 mmol/L — ABNORMAL LOW (ref 3.5–5.1)
Sodium: 134 mmol/L — ABNORMAL LOW (ref 135–145)
Total Bilirubin: 0.8 mg/dL (ref 0.3–1.2)
Total Protein: 7.8 g/dL (ref 6.5–8.1)

## 2021-03-17 LAB — CBC
HCT: 40 % (ref 39.0–52.0)
Hemoglobin: 13.1 g/dL (ref 13.0–17.0)
MCH: 29.4 pg (ref 26.0–34.0)
MCHC: 32.8 g/dL (ref 30.0–36.0)
MCV: 89.9 fL (ref 80.0–100.0)
Platelets: 251 10*3/uL (ref 150–400)
RBC: 4.45 MIL/uL (ref 4.22–5.81)
RDW: 12.4 % (ref 11.5–15.5)
WBC: 5.9 10*3/uL (ref 4.0–10.5)
nRBC: 0 % (ref 0.0–0.2)

## 2021-03-17 LAB — RAPID URINE DRUG SCREEN, HOSP PERFORMED
Amphetamines: NOT DETECTED
Barbiturates: NOT DETECTED
Benzodiazepines: NOT DETECTED
Cocaine: POSITIVE — AB
Opiates: NOT DETECTED
Tetrahydrocannabinol: POSITIVE — AB

## 2021-03-17 LAB — LIPID PANEL
Cholesterol: 176 mg/dL (ref 0–200)
HDL: 46 mg/dL (ref >40)
LDL Cholesterol: 119 mg/dL — ABNORMAL HIGH (ref 0–99)
Total CHOL/HDL Ratio: 3.8 RATIO
Triglycerides: 57 mg/dL (ref <150)
VLDL: 11 mg/dL (ref 0–40)

## 2021-03-17 LAB — RESP PANEL BY RT-PCR (FLU A&B, COVID) ARPGX2
Influenza A by PCR: NEGATIVE
Influenza B by PCR: NEGATIVE
SARS Coronavirus 2 by RT PCR: NEGATIVE

## 2021-03-17 LAB — HEMOGLOBIN A1C
Hgb A1c MFr Bld: 6.2 % — ABNORMAL HIGH (ref 4.8–5.6)
Mean Plasma Glucose: 131.24 mg/dL

## 2021-03-17 LAB — APTT: aPTT: 126 seconds — ABNORMAL HIGH (ref 24–36)

## 2021-03-17 LAB — PROTIME-INR
INR: 1 (ref 0.8–1.2)
Prothrombin Time: 13.5 seconds (ref 11.4–15.2)

## 2021-03-17 LAB — TROPONIN I (HIGH SENSITIVITY)
Troponin I (High Sensitivity): 10 ng/L (ref <18)
Troponin I (High Sensitivity): 13 ng/L (ref <18)

## 2021-03-17 MED ORDER — SACUBITRIL-VALSARTAN 49-51 MG PO TABS
1.0000 | ORAL_TABLET | Freq: Two times a day (BID) | ORAL | Status: DC
Start: 2021-03-18 — End: 2021-03-19
  Administered 2021-03-18 – 2021-03-19 (×4): 1 via ORAL
  Filled 2021-03-17 (×5): qty 1

## 2021-03-17 MED ORDER — ONDANSETRON HCL 4 MG/2ML IJ SOLN
4.0000 mg | Freq: Once | INTRAMUSCULAR | Status: AC
  Administered 2021-03-17: 4 mg via INTRAVENOUS
  Filled 2021-03-17: qty 2

## 2021-03-17 MED ORDER — ASPIRIN 81 MG PO CHEW
324.0000 mg | CHEWABLE_TABLET | Freq: Once | ORAL | Status: AC
  Administered 2021-03-17: 324 mg via ORAL

## 2021-03-17 MED ORDER — APIXABAN 5 MG PO TABS
5.0000 mg | ORAL_TABLET | Freq: Two times a day (BID) | ORAL | Status: DC
  Administered 2021-03-18 (×2): 5 mg via ORAL
  Filled 2021-03-17 (×2): qty 1

## 2021-03-17 MED ORDER — HEPARIN SODIUM (PORCINE) 5000 UNIT/ML IJ SOLN: 4000.0000 [IU] | Freq: Once | INTRAMUSCULAR | Status: DC

## 2021-03-17 MED ORDER — ROSUVASTATIN CALCIUM 20 MG PO TABS
20.0000 mg | ORAL_TABLET | Freq: Every day | ORAL | Status: DC
  Administered 2021-03-18: 20 mg via ORAL
  Filled 2021-03-17: qty 1

## 2021-03-17 MED ORDER — ONDANSETRON HCL 4 MG/2ML IJ SOLN: 4.0000 mg | Freq: Four times a day (QID) | INTRAMUSCULAR | Status: DC | PRN

## 2021-03-17 MED ORDER — LIDOCAINE VISCOUS HCL 2 % MT SOLN
15.0000 mL | Freq: Once | OROMUCOSAL | Status: AC
  Administered 2021-03-17: 15 mL via ORAL
  Filled 2021-03-17: qty 15

## 2021-03-17 MED ORDER — CLOPIDOGREL BISULFATE 75 MG PO TABS
75.0000 mg | ORAL_TABLET | Freq: Every day | ORAL | Status: DC
  Administered 2021-03-18 – 2021-03-19 (×2): 75 mg via ORAL
  Filled 2021-03-17 (×2): qty 1

## 2021-03-17 MED ORDER — SODIUM CHLORIDE 0.9 % IV SOLN: INTRAVENOUS | Status: DC

## 2021-03-17 MED ORDER — IOHEXOL 350 MG/ML SOLN
100.0000 mL | Freq: Once | INTRAVENOUS | Status: AC | PRN
  Administered 2021-03-17: 100 mL via INTRAVENOUS

## 2021-03-17 MED ORDER — NITROGLYCERIN IN D5W 200-5 MCG/ML-% IV SOLN
0.0000 ug/min | INTRAVENOUS | Status: DC
  Administered 2021-03-18: 5 ug/min via INTRAVENOUS
  Filled 2021-03-17: qty 250

## 2021-03-17 MED ORDER — BUPRENORPHINE HCL-NALOXONE HCL 8-2 MG SL SUBL: 2.0000 | SUBLINGUAL_TABLET | Freq: Every day | SUBLINGUAL | Status: DC

## 2021-03-17 MED ORDER — ACETAMINOPHEN 325 MG PO TABS
650.0000 mg | ORAL_TABLET | ORAL | Status: DC | PRN
  Administered 2021-03-18 – 2021-03-19 (×3): 650 mg via ORAL
  Filled 2021-03-17 (×4): qty 2

## 2021-03-17 MED ORDER — NITROGLYCERIN 0.4 MG SL SUBL
0.4000 mg | SUBLINGUAL_TABLET | SUBLINGUAL | Status: DC | PRN
  Administered 2021-03-17: 0.4 mg via SUBLINGUAL
  Filled 2021-03-17: qty 1

## 2021-03-17 MED ORDER — ALUM & MAG HYDROXIDE-SIMETH 200-200-20 MG/5ML PO SUSP
30.0000 mL | Freq: Once | ORAL | Status: AC
  Administered 2021-03-17: 30 mL via ORAL
  Filled 2021-03-17: qty 30

## 2021-03-17 NOTE — ED Notes (Signed)
Pt has not taken his brilinta X3 days, stent placed approx 7 months ago.

## 2021-03-17 NOTE — ED Provider Notes (Signed)
I evaluated the patient after his second EKG which was performed at 1828.  I was given the first and second EKGs at the same time.  Both EKGs indicate inferolateral ST elevation, change from prior tracings.  I asked nursing to page out a code STEMI which they did.  I ordered aspirin and nitroglycerin.  Patient was moved to trauma room C.  There he was picked up by Dr. Karene Fry, who informs me that cardiology has canceled the code STEMI.  Dr. Karene Fry will assume care.   Mancel Bale, MD 03/17/21 [phone]

## 2021-03-17 NOTE — ED Provider Notes (Signed)
[facility] EMERGENCY DEPARTMENT Provider Note   CSN: [id number] Arrival date & time: 03/17/21  1806     History Chief Complaint  Patient presents with   Chest Pain    Christopher Gibson is a 50 y.o. male.   Chest Pain  50 year old male presenting to the emergency department with a complaint of chest pain.  He states that earlier this afternoon he developed left-sided chest pain that radiated to his back.  He endorses nausea.  I was informed by nursing staff that a STEMI had been bedded in trauma C.  I evaluated the patient bedside and subsequently received a call from cardiology.  Cardiology on-call felt that EKG was not consistent with STEMI and subsequently canceled code STEMI.  Obtained an HPI and reviewed the patient's triage notes.  The patient endorses left-sided chest pain, upper and lower back pain that started last night and worsened throughout the day.  He states the pain is dull and intermittently sharp and radiates to his back and is sharp in his back.  The patient has not taken his Brilinta in the past 3 days.  He does endorse a history of cocaine use and last used cocaine 3 days ago.  He endorses a smoking history and a history of hypertension.  On chart review, the patient has had a cardiac catheterization due to occlusion of the mid LAD and ST segment MI with subsequent stenting of the mid LAD.  He was also found to have mild to moderate nonobstructive CAD in the circumflex and moderate to severe nonobstructive CAD in the proximal to distal RCA.  Past Medical History:  Diagnosis Date   CAD (coronary artery disease)    Chronic combined systolic and diastolic CHF (congestive heart failure) (HCC)    Cocaine abuse (HCC)    Heroin abuse (HCC)    HTN (hypertension)     Patient Active Problem List   Diagnosis Date Noted   CAD (coronary artery disease), native coronary artery 03/17/2021   Chest pain 03/17/2021   Cocaine abuse ([place]) 03/17/2021   Chronic  combined systolic and diastolic CHF (congestive heart failure) ([place]) 03/17/2021   Heroin abuse ([place]) 03/17/2021   HTN (hypertension) 03/17/2021   Acute ST elevation myocardial infarction (STEMI) due to occlusion of distal portion of left anterior descending (LAD) coronary artery ([place]) 10/13/2020   ST elevation myocardial infarction (STEMI) ([place])    Coronary artery disease involving native coronary artery of native heart with unstable angina pectoris (HCC)    Acute combined systolic and diastolic HF (heart failure), NYHA class 3 ([place])     Past Surgical History:  Procedure Laterality Date   CORONARY/GRAFT ACUTE MI REVASCULARIZATION N/A 10/13/2020   Procedure: Coronary/Graft Acute MI Revascularization;  Surgeon: Belva Crome, MD;  Location: [facility];  Service: Cardiovascular;  Laterality: N/A;   LEFT HEART CATH AND CORONARY ANGIOGRAPHY N/A 10/13/2020   Procedure: LEFT HEART CATH AND CORONARY ANGIOGRAPHY;  Surgeon: Belva Crome, MD;  Location: [facility];  Service: Cardiovascular;  Laterality: N/A;       Family History  Problem Relation Age of Onset   Coronary artery disease Neg Hx     Social History   Tobacco Use   Smoking status: Every Day   Smokeless tobacco: Never  Substance Use Topics   Alcohol use: Not Currently   Drug use: Yes    Types: Cocaine, Heroin    Home Medications Prior to Admission medications   Medication Sig  Start Date End Date Taking? Authorizing Provider  apixaban (ELIQUIS) 5 MG TABS tablet Take 1 tablet (5 mg total) by mouth 2 (two) times daily. 11/03/20  Yes Katherine Roan, MD  carvedilol (COREG) 6.25 MG tablet Take 1 tablet (6.25 mg total) by mouth 2 (two) times daily with a meal. 11/03/20  Yes Winfrey, Jenne Pane, MD  clopidogrel (PLAVIX) 75 MG tablet Take 1 tablet (75 mg total) by mouth daily. 11/04/20  Yes Katherine Roan, MD  nitroGLYCERIN (NITROSTAT) 0.4 MG SL tablet Place 1 tablet (0.4 mg total) under the tongue every 5 (five)  minutes as needed for chest pain (CP or SOB). 10/15/20  Yes Bhagat, Bhavinkumar, PA  rosuvastatin (CRESTOR) 20 MG tablet Take 1 tablet (20 mg total) by mouth daily. Please keep upcoming appt in November 2022 with Cardiologist before anymore refills. Thank you Final Attempt 02/14/21  Yes Belva Crome, MD  sacubitril-valsartan (ENTRESTO) 49-51 MG Take 1 tablet by mouth 2 (two) times daily. 11/03/20  Yes Katherine Roan, MD    Allergies    Keflex [cephalexin]  Review of Systems   Review of Systems  Cardiovascular:  Positive for chest pain.   Physical Exam Updated Vital Signs BP (!) 168/93   Pulse 64   Temp 99 F (37.2 C) (Temporal)   Resp 18   Ht 5\' 6"  (1.676 m)   Wt 77 kg   SpO2 99%   BMI 27.40 kg/m   Physical Exam  ED Results / Procedures / Treatments   Labs (all labs ordered are listed, but only abnormal results are displayed) Labs Reviewed  HEMOGLOBIN A1C - Abnormal; Notable for the following components:      Result Value   Hgb A1c MFr Bld 6.2 (*)    All other components within normal limits  APTT - Abnormal; Notable for the following components:   aPTT 126 (*)    All other components within normal limits  COMPREHENSIVE METABOLIC PANEL - Abnormal; Notable for the following components:   Sodium 134 (*)    Potassium 3.4 (*)    CO2 20 (*)    Glucose, Bld 125 (*)    All other components within normal limits  LIPID PANEL - Abnormal; Notable for the following components:   LDL Cholesterol 119 (*)    All other components within normal limits  RAPID URINE DRUG SCREEN, HOSP PERFORMED - Abnormal; Notable for the following components:   Cocaine POSITIVE (*)    Tetrahydrocannabinol POSITIVE (*)    All other components within normal limits  RESP PANEL BY RT-PCR (FLU A&B, COVID) ARPGX2  CBC  PROTIME-INR  HIV ANTIBODY (ROUTINE TESTING W REFLEX)  TROPONIN I (HIGH SENSITIVITY)  TROPONIN I (HIGH SENSITIVITY)  TROPONIN I (HIGH SENSITIVITY)  TROPONIN I (HIGH SENSITIVITY)     EKG EKG Interpretation  Date/Time:  Thursday March 17 2021 23:28:38 EST Ventricular Rate:  67 PR Interval:  149 QRS Duration: 105 QT Interval:  477 QTC Calculation: 504 R Axis:   102 Text Interpretation: Sinus rhythm Anterolateral infarct, age indeterminate Abnormal T, consider ischemia, lateral leads ST elevation, consider inferior injury Prolonged QT interval Confirmed by Regan Lemming ([id number]) on 03/17/2021 11:31:21 PM  Radiology DG Chest 2 View  Result Date: 03/17/2021 CLINICAL DATA:  Chest pain EXAM: CHEST - 2 VIEW COMPARISON:  11/18/2020 FINDINGS: The heart size and mediastinal contours are within normal limits. Both lungs are clear. The visualized skeletal structures are unremarkable. IMPRESSION: No active cardiopulmonary disease. Electronically Signed   By:  Franchot Gallo M.D.   On: 03/17/2021 19:23   CT Angio Chest/Abd/Pel for Dissection W and/or Wo Contrast  Result Date: 03/17/2021 CLINICAL DATA:  Abdominal pain.  Concern for aortic dissection. EXAM: CT ANGIOGRAPHY CHEST, ABDOMEN AND PELVIS TECHNIQUE: Non-contrast CT of the chest was initially obtained. Multidetector CT imaging through the chest, abdomen and pelvis was performed using the standard protocol during bolus administration of intravenous contrast. Multiplanar reconstructed images and MIPs were obtained and reviewed to evaluate the vascular anatomy. CONTRAST:  142mL OMNIPAQUE IOHEXOL 350 MG/ML SOLN COMPARISON:  CT dated 11/02/2020. FINDINGS: CTA CHEST FINDINGS Cardiovascular: There is no cardiomegaly or pericardial effusion. There is coronary artery stent in the LAD. The thoracic aorta is unremarkable. The origins of the great vessels of the aortic arch appear patent. No pulmonary artery embolus identified. Mediastinum/Nodes: No hilar or mediastinal adenopathy. The esophagus is grossly unremarkable. No mediastinal fluid collection. Lungs/Pleura: Paraseptal emphysema. No focal consolidation, pleural effusion,  pneumothorax. The central airways are patent. Musculoskeletal: No acute osseous pathology. Review of the MIP images confirms the above findings. CTA ABDOMEN AND PELVIS FINDINGS VASCULAR Aorta: Normal caliber aorta without aneurysm, dissection, vasculitis or significant stenosis. Celiac: Patent without evidence of aneurysm, dissection, vasculitis or significant stenosis. SMA: Patent without evidence of aneurysm, dissection, vasculitis or significant stenosis. Renals: Both renal arteries are patent without evidence of aneurysm, dissection, vasculitis, fibromuscular dysplasia or significant stenosis. IMA: Patent without evidence of aneurysm, dissection, vasculitis or significant stenosis. Inflow: Patent without evidence of aneurysm, dissection, vasculitis or significant stenosis. Veins: No obvious venous abnormality within the limitations of this arterial phase study. Review of the MIP images confirms the above findings. NON-VASCULAR No intra-abdominal free air or free fluid. Hepatobiliary: Faintly visualized indeterminate 3.4 x 2.5 cm low attenuating lesion with peripheral enhancement in the left lobe of the liver, likely a hemangioma. This lesion however is not characterized on this CT. MRI may provide better characterization. No intrahepatic biliary ductal dilatation. The gallbladder is unremarkable. Pancreas: Unremarkable. No pancreatic ductal dilatation or surrounding inflammatory changes. Spleen: Normal in size without focal abnormality. Adrenals/Urinary Tract: The adrenal glands unremarkable. There is no hydronephrosis on either side. There is a 15 mm right renal interpolar cyst and subcentimeter left renal inferior pole hypodense lesion which is too small to characterize. The visualized ureters and urinary bladder appear unremarkable. Stomach/Bowel: There is no bowel obstruction or active inflammation. The appendix is normal. Lymphatic: No adenopathy. Reproductive: The prostate and seminal vesicles are grossly  unremarkable. Other: None Musculoskeletal: Degenerative changes at L5-S1. No acute osseous pathology. Review of the MIP images confirms the above findings. IMPRESSION: 1. No acute intrathoracic, abdominal, or pelvic pathology. No aortic aneurysm or dissection. 2. Faintly visualized indeterminate 3.4 x 2.5 cm low attenuating lesion with peripheral enhancement in the left lobe of the liver, likely a hemangioma. 3. Emphysema (ICD10-J43.9). Electronically Signed   By: Anner Crete M.D.   On: 03/17/2021 21:35    Procedures Procedures   Medications Ordered in ED Medications  nitroGLYCERIN (NITROSTAT) SL tablet 0.4 mg (0.4 mg Sublingual Given 03/17/21 1900)  0.9 %  sodium chloride infusion ( Intravenous Not Given 03/17/21 1944)  nitroGLYCERIN 50 mg in dextrose 5 % 250 mL (0.2 mg/mL) infusion (15 mcg/min Intravenous Infusion Verify 03/18/21 0035)  sacubitril-valsartan (ENTRESTO) 49-51 mg per tablet (has no administration in time range)  rosuvastatin (CRESTOR) tablet 20 mg (has no administration in time range)  clopidogrel (PLAVIX) tablet 75 mg (has no administration in time range)  apixaban (ELIQUIS) tablet  5 mg (5 mg Oral Given 03/18/21 0033)  acetaminophen (TYLENOL) tablet 650 mg (has no administration in time range)  ondansetron (ZOFRAN) injection 4 mg (has no administration in time range)  buprenorphine-naloxone (SUBOXONE) 2-0.5 mg per SL tablet 2 tablet (has no administration in time range)  buprenorphine-naloxone (SUBOXONE) 8-2 mg per SL tablet 1 tablet (has no administration in time range)  aspirin chewable tablet 324 mg (324 mg Oral Given 03/17/21 1848)  ondansetron (ZOFRAN) injection 4 mg (4 mg Intravenous Given 03/17/21 1919)  iohexol (OMNIPAQUE) 350 MG/ML injection 100 mL (100 mLs Intravenous Contrast Given 03/17/21 2113)  alum & mag hydroxide-simeth (MAALOX/MYLANTA) 200-200-20 MG/5ML suspension 30 mL (30 mLs Oral Given 03/17/21 2310)    And  lidocaine (XYLOCAINE) 2 % viscous mouth  solution 15 mL (15 mLs Oral Given 03/17/21 2310)    ED Course  I have reviewed the triage vital signs and the nursing notes.  Pertinent labs & imaging results that were available during my care of the patient were reviewed by me and considered in my medical decision making (see chart for details).    MDM Rules/Calculators/A&P                           50 year old male with medical history above to include STEMI status post stent to the LAD on Plavix and Eliquis presenting to the emergency department with chest pain.  On arrival, the patient endorsed chest pain that was left-sided, dull and throbbing, radiating to his back.  He states that the pain in his back is sharp.  He has a history of cocaine induced MI and endorses utilizing cocaine in the last 2 days.  He also endorses heroin use.  I was informed by nursing staff that a STEMI had been bedded in trauma C.  I evaluated the patient bedside and subsequently received a call from cardiology.  Cardiology on-call felt that EKG was not consistent with STEMI and subsequently canceled code STEMI.  Obtained an HPI and reviewed the patient's triage notes.  The patient endorses left-sided chest pain, upper and lower back pain that started last night and worsened throughout the day.  He states the pain is dull and intermittently sharp and radiates to his back and is sharp in his back.  The patient has not taken his Brilinta in the past 3 days.  He does endorse a history of cocaine use and last used cocaine 3 days ago.  He endorses a smoking history and a history of hypertension.  On chart review, the patient has had a cardiac catheterization due to occlusion of the mid LAD and ST segment with subsequent stenting of the mid LAD.  He was also found to have mild to moderate nonobstructive CAD in the circumflex and moderate to severe nonobstructive CAD in the proximal to distal RCA.  Vitals on arrival significant for afebrile, hemodynamically stable,  hypertension noted, BP 177/109.  The patient continued to endorse left-sided chest pain.  Differential includes cocaine induced vasospasm/chest pain/MI, ACS, stent thrombosis, aortic dissection, hypertensive emergency, pneumothorax, pneumonia, PE, Boerhaave's, GERD.  EKG on arrival with ST segment elevations in the inferior lateral leads similar to prior.  After discussion with STEMI cardiology, the patient's code STEMI had been canceled.  He had received a heparin bolus and full-strength aspirin prior to cancellation of STEMI.  Will evaluate further for aortic dissection given the patient's chest pain radiating to his back that is sharp in nature.  CTA  chest abdomen pelvis was negative for aortic dissection or other acute cardiac or pulmonary abnormality.  Troponins x2 were collected and resulted normal.  UDS was positive for cocaine.  Spoke with on-call cardiology regarding the patient's findings and recent work-up.  Feel the chest pain is likely secondary to recent cocaine use and vasospasm.  Low concern for stent thrombosis given normal troponins.  The patient had some improvement with sublingual nitroglycerin and his chest pain but continues to endorse chest pain in the ED.  No improvement with viscous lidocaine and Maalox.  Suspect likely coronary vasospasm from cocaine.  The patient was started on a nitro gtt. and hospitalist medicine was consulted for admission.  Patient was subsequently admitted in stable condition.   Final Clinical Impression(s) / ED Diagnoses Final diagnoses:  Chest pain, unspecified type  Hypertension, unspecified type  Cocaine use  Heroin use    Rx / DC Orders ED Discharge Orders     None        Ernie Avena, MD 03/18/21 0131

## 2021-03-17 NOTE — ED Triage Notes (Signed)
Pt reports chest pain and upper and lower back pain onset last night. Reports associated hiccups.Pt reports pain is aching in nature. 2+ blt radial pulses.

## 2021-03-18 ENCOUNTER — Observation Stay (HOSPITAL_COMMUNITY): Payer: Self-pay

## 2021-03-18 ENCOUNTER — Encounter (HOSPITAL_COMMUNITY): Payer: Self-pay | Admitting: Internal Medicine

## 2021-03-18 DIAGNOSIS — R079 Chest pain, unspecified: Secondary | ICD-10-CM

## 2021-03-18 DIAGNOSIS — I5042 Chronic combined systolic (congestive) and diastolic (congestive) heart failure: Secondary | ICD-10-CM

## 2021-03-18 DIAGNOSIS — F1410 Cocaine abuse, uncomplicated: Secondary | ICD-10-CM

## 2021-03-18 DIAGNOSIS — F1110 Opioid abuse, uncomplicated: Secondary | ICD-10-CM

## 2021-03-18 DIAGNOSIS — I160 Hypertensive urgency: Secondary | ICD-10-CM | POA: Diagnosis present

## 2021-03-18 DIAGNOSIS — R0789 Other chest pain: Secondary | ICD-10-CM

## 2021-03-18 DIAGNOSIS — I10 Essential (primary) hypertension: Secondary | ICD-10-CM

## 2021-03-18 DIAGNOSIS — I25118 Atherosclerotic heart disease of native coronary artery with other forms of angina pectoris: Secondary | ICD-10-CM

## 2021-03-18 LAB — ECHOCARDIOGRAM COMPLETE
AR max vel: 2.31 cm2
AV Area VTI: 2.47 cm2
AV Area mean vel: 2.45 cm2
AV Mean grad: 3 mmHg
AV Peak grad: 7.3 mmHg
Ao pk vel: 1.35 m/s
Area-P 1/2: 3.4 cm2
Calc EF: 40.8 %
Height: 66 in
S' Lateral: 3 cm
Single Plane A2C EF: 34.7 %
Single Plane A4C EF: 42.1 %
Weight: 2603.19 oz

## 2021-03-18 LAB — TROPONIN I (HIGH SENSITIVITY)
Troponin I (High Sensitivity): 18 ng/L — ABNORMAL HIGH (ref <18)
Troponin I (High Sensitivity): 20 ng/L — ABNORMAL HIGH (ref <18)

## 2021-03-18 LAB — HIV ANTIBODY (ROUTINE TESTING W REFLEX): HIV Screen 4th Generation wRfx: NONREACTIVE

## 2021-03-18 MED ORDER — ASPIRIN EC 81 MG PO TBEC
81.0000 mg | DELAYED_RELEASE_TABLET | Freq: Every day | ORAL | Status: DC
  Administered 2021-03-18 – 2021-03-19 (×2): 81 mg via ORAL
  Filled 2021-03-18 (×2): qty 1

## 2021-03-18 MED ORDER — POTASSIUM CHLORIDE CRYS ER 20 MEQ PO TBCR
20.0000 meq | EXTENDED_RELEASE_TABLET | Freq: Once | ORAL | Status: AC
  Administered 2021-03-18: 20 meq via ORAL
  Filled 2021-03-18: qty 1

## 2021-03-18 MED ORDER — CARVEDILOL 6.25 MG PO TABS
6.2500 mg | ORAL_TABLET | Freq: Two times a day (BID) | ORAL | Status: DC
  Administered 2021-03-18 – 2021-03-19 (×2): 6.25 mg via ORAL
  Filled 2021-03-18 (×2): qty 1

## 2021-03-18 MED ORDER — ROSUVASTATIN CALCIUM 20 MG PO TABS
40.0000 mg | ORAL_TABLET | Freq: Every day | ORAL | Status: DC
  Administered 2021-03-19: 40 mg via ORAL
  Filled 2021-03-18: qty 2

## 2021-03-18 MED ORDER — BUPRENORPHINE HCL-NALOXONE HCL 8-2 MG SL SUBL
1.0000 | SUBLINGUAL_TABLET | Freq: Two times a day (BID) | SUBLINGUAL | Status: DC
  Administered 2021-03-19 (×2): 1 via SUBLINGUAL
  Filled 2021-03-18 (×2): qty 1

## 2021-03-18 MED ORDER — AMLODIPINE BESYLATE 5 MG PO TABS
5.0000 mg | ORAL_TABLET | Freq: Every day | ORAL | Status: DC
  Administered 2021-03-18: 5 mg via ORAL
  Filled 2021-03-18: qty 1

## 2021-03-18 MED ORDER — HYDRALAZINE HCL 50 MG PO TABS
50.0000 mg | ORAL_TABLET | Freq: Three times a day (TID) | ORAL | Status: DC
  Administered 2021-03-18 – 2021-03-19 (×3): 50 mg via ORAL
  Filled 2021-03-18 (×3): qty 1

## 2021-03-18 MED ORDER — PERFLUTREN LIPID MICROSPHERE
1.0000 mL | INTRAVENOUS | Status: AC | PRN
  Administered 2021-03-18: 2 mL via INTRAVENOUS
  Filled 2021-03-18: qty 10

## 2021-03-18 MED ORDER — BUPRENORPHINE HCL-NALOXONE HCL 2-0.5 MG SL SUBL
2.0000 | SUBLINGUAL_TABLET | SUBLINGUAL | Status: DC | PRN
  Administered 2021-03-18 (×2): 2 via SUBLINGUAL
  Filled 2021-03-18 (×2): qty 2

## 2021-03-18 NOTE — Progress Notes (Signed)
PROGRESS NOTE  Brief Narrative: Christopher Gibson is a 50 y.o. male with a history of polysubstance/cocaine abuse, STEMI s/p stent to mLAD June 2022, LV apical thrombus, HFrEF who presented to the ED 11/10 with pain in the chest and back occurring at rest. ECG was abnormal, code STEMI called but subsequently cancelled based on cardiology input. Follow up ECG revealed chronic lateral TWI. He was in sinus rhythm with elevated blood pressure, troponins 10 > 13 > 18 > 20, UDS +THC and cocaine. CTA chest revealed no significant acute pathology. Nitroglycerin infusion was started and patient admitted this morning by Dr. Julian Reil. Cardiology is consulted for further recommendations.   Subjective: Pt withdrawn during encounter reports he's having improved but constant moderate pain in the lower chest and throughout the back. Also has a headache. No dyspnea or swelling.  Objective: BP (!) 172/96 (BP Location: Right Arm)   Pulse 67   Temp 99 F (37.2 C) (Oral)   Resp 18   Ht 5\' 6"  (1.676 m)   Wt 73.8 kg   SpO2 99%   BMI 26.26 kg/m   Gen: Nontoxic male in no distress Pulm: Clear and nonlabored on room air  CV: RRR, no murmur, no JVD, no edema GI: Soft, NT, ND, +BS  Neuro: Alert and oriented. Incompletely interactive with exam but no definite focal deficits. Skin: Warm and dry.  Assessment & Plan: Chest and back pain: Atypical presentation and cardiac enzymes are reassuring. ECG is chronically abnormal and not of acute concern per cardiology.  - Appreciate cardiology evaluation and recommendations.   CAD s/p DES to mLAD for STEMI June 2022 by Dr. July 2022:  - Continue antiplatelet, statin, increase statin dose (crestor to 40mg ) due to LDL of 119.  LV apical thrombus: Seen on echo in June.  - Repeat echo, continuing eliquis (missed several doses)  HTN with HTN urgency:  - Wean off NTG gtt as able. Defer to cardiology regarding use of alternative agents e.g. hydralazine, isosorbide, norvasc.  Anticipate improvement once cocaine is out of circulation  Chronic combined HFrEF:  - Continue entresto. No diuresis currently needed. GDMT is challenging due to limited follow up and cocaine use.   Cocaine use:  - Cessation counseling provided. CSW consulted.  Emphysema:  - Tobacco cessation counseling provided. No wheezing.   Suspected hepatic hemangioma: Seen on CTA.  - Consider MRI follow up  , MD Pager on [facility] 03/18/2021, 12:44 PM

## 2021-03-18 NOTE — H&P (Signed)
History and Physical    Christopher Gibson ESP:[id number] DOB: Oct 05, 1970 DOA: 03/17/2021  PCP: Pcp, No  Patient coming from: Home  I have personally briefly reviewed patient's old medical records in [facility]  Chief Complaint: CP  HPI: Christopher Gibson is a 50 y.o. male with medical history significant of ongoing heroin and cocaine abuse.  STEMI in June 2022 secondary to cocaine abuse.  Had stent placement.  EF noted to be reduced on 2d echo at that time.  Pt presents to ED with c/o CP.  Last used heroin and cocaine 2 days ago he admits.  L sided CP and back pain onset last night, worsened throughout day, dull and intermittently sharp.  Not taken home meds in past 3 days.  Normally on plavix and eliquis.  Symptoms constant, persistent.   ED Course: Trop neg.  EKG initially concerning for STEMI but cards unimpressed.  CP slighly improved with SL NTG.  NTG GTT being started, current BP 180/100.   Review of Systems: As per HPI, otherwise all review of systems negative.  Past Medical History:  Diagnosis Date   CAD (coronary artery disease)    Chronic combined systolic and diastolic CHF (congestive heart failure) (HCC)    Cocaine abuse (HCC)    Heroin abuse (HCC)    HTN (hypertension)     Past Surgical History:  Procedure Laterality Date   CORONARY/GRAFT ACUTE MI REVASCULARIZATION N/A 10/13/2020   Procedure: Coronary/Graft Acute MI Revascularization;  Surgeon: Lyn Records, MD;  Location: [facility] INVASIVE CV [facility];  Service: Cardiovascular;  Laterality: N/A;   LEFT HEART CATH AND CORONARY ANGIOGRAPHY N/A 10/13/2020   Procedure: LEFT HEART CATH AND CORONARY ANGIOGRAPHY;  Surgeon: Lyn Records, MD;  Location: [facility] INVASIVE CV LAB;  Service: Cardiovascular;  Laterality: N/A;     reports that he has been smoking. He has never used smokeless tobacco. No history on file for alcohol use and drug use.  Allergies  Allergen Reactions   Keflex [Cephalexin] Other (See Comments)     "Locks my body up"    Family History  Problem Relation Age of Onset   Coronary artery disease Neg Hx      Prior to Admission medications   Medication Sig Start Date End Date Taking? Authorizing Provider  apixaban (ELIQUIS) 5 MG TABS tablet Take 1 tablet (5 mg total) by mouth 2 (two) times daily. 11/03/20  Yes Angelita Ingles, MD  carvedilol (COREG) 6.25 MG tablet Take 1 tablet (6.25 mg total) by mouth 2 (two) times daily with a meal. 11/03/20  Yes Winfrey, Kimberlee Nearing, MD  clopidogrel (PLAVIX) 75 MG tablet Take 1 tablet (75 mg total) by mouth daily. 11/04/20  Yes Angelita Ingles, MD  nitroGLYCERIN (NITROSTAT) 0.4 MG SL tablet Place 1 tablet (0.4 mg total) under the tongue every 5 (five) minutes as needed for chest pain (CP or SOB). 10/15/20  Yes Bhagat, Bhavinkumar, PA  rosuvastatin (CRESTOR) 20 MG tablet Take 1 tablet (20 mg total) by mouth daily. Please keep upcoming appt in November 2022 with Cardiologist before anymore refills. Thank you Final Attempt 02/14/21  Yes Lyn Records, MD  sacubitril-valsartan (ENTRESTO) 49-51 MG Take 1 tablet by mouth 2 (two) times daily. 11/03/20  Yes Angelita Ingles, MD    Physical Exam: Vitals:   03/17/21 2115 03/17/21 2130 03/17/21 2222 03/17/21 2348  BP: (!) 177/95 (!) 172/100 (!) 184/108 (!) 176/98  Pulse: (!) 57 69 65 63  Resp: 12 (!) 24  19 20  SpO2: 100% 99% 100% 100%  Weight:      Height:        Constitutional: NAD, calm, comfortable Eyes: PERRL, lids and conjunctivae normal ENMT: Mucous membranes are moist. Posterior pharynx clear of any exudate or lesions.Normal dentition.  Neck: normal, supple, no masses, no thyromegaly Respiratory: clear to auscultation bilaterally, no wheezing, no crackles. Normal respiratory effort. No accessory muscle use.  Cardiovascular: Regular rate and rhythm, no murmurs / rubs / gallops. No extremity edema. 2+ pedal pulses. No carotid bruits.  Abdomen: no tenderness, no masses palpated. No  hepatosplenomegaly. Bowel sounds positive.  Musculoskeletal: no clubbing / cyanosis. No joint deformity upper and lower extremities. Good ROM, no contractures. Normal muscle tone.  Skin: no rashes, lesions, ulcers. No induration Neurologic: CN 2-12 grossly intact. Sensation intact, DTR normal. Strength 5/5 in all 4.  Psychiatric: Normal judgment and insight. Alert and oriented x 3. Normal mood.    Labs on Admission: I have personally reviewed following labs and imaging studies  CBC: Recent Labs  Lab 03/17/21 1926  WBC 5.9  HGB 13.1  HCT 40.0  MCV 89.9  PLT 123XX123   Basic Metabolic Panel: Recent Labs  Lab 03/17/21 1926  NA 134*  K 3.4*  CL 101  CO2 20*  GLUCOSE 125*  BUN 16  CREATININE 0.69  CALCIUM 9.5   GFR: Estimated Creatinine Clearance: 109.2 mL/min (by C-G formula based on SCr of 0.69 mg/dL). Liver Function Tests: Recent Labs  Lab 03/17/21 1926  AST 15  ALT 10  ALKPHOS 56  BILITOT 0.8  PROT 7.8  ALBUMIN 3.7   No results for input(s): LIPASE, AMYLASE in the last 168 hours. No results for input(s): AMMONIA in the last 168 hours. Coagulation Profile: Recent Labs  Lab 03/17/21 1926  INR 1.0   Cardiac Enzymes: No results for input(s): CKTOTAL, CKMB, CKMBINDEX, TROPONINI in the last 168 hours. BNP (last 3 results) No results for input(s): PROBNP in the last 8760 hours. HbA1C: Recent Labs    03/17/21 1924  HGBA1C 6.2*   CBG: No results for input(s): GLUCAP in the last 168 hours. Lipid Profile: Recent Labs    03/17/21 1924  CHOL 176  HDL 46  LDLCALC 119*  TRIG 57  CHOLHDL 3.8   Thyroid Function Tests: No results for input(s): TSH, T4TOTAL, FREET4, T3FREE, THYROIDAB in the last 72 hours. Anemia Panel: No results for input(s): VITAMINB12, FOLATE, FERRITIN, TIBC, IRON, RETICCTPCT in the last 72 hours. Urine analysis:    Component Value Date/Time   COLORURINE YELLOW 11/18/2020 1241   APPEARANCEUR CLEAR 11/18/2020 1241   LABSPEC <1.005 (L)  11/18/2020 1241   PHURINE 7.0 11/18/2020 1241   GLUCOSEU NEGATIVE 11/18/2020 1241   HGBUR NEGATIVE 11/18/2020 1241   BILIRUBINUR NEGATIVE 11/18/2020 1241   KETONESUR NEGATIVE 11/18/2020 1241   PROTEINUR NEGATIVE 11/18/2020 1241   NITRITE NEGATIVE 11/18/2020 1241   LEUKOCYTESUR NEGATIVE 11/18/2020 1241    Radiological Exams on Admission: DG Chest 2 View  Result Date: 03/17/2021 CLINICAL DATA:  Chest pain EXAM: CHEST - 2 VIEW COMPARISON:  11/18/2020 FINDINGS: The heart size and mediastinal contours are within normal limits. Both lungs are clear. The visualized skeletal structures are unremarkable. IMPRESSION: No active cardiopulmonary disease. Electronically Signed   By: Franchot Gallo M.D.   On: 03/17/2021 19:23   CT Angio Chest/Abd/Pel for Dissection W and/or Wo Contrast  Result Date: 03/17/2021 CLINICAL DATA:  Abdominal pain.  Concern for aortic dissection. EXAM: CT ANGIOGRAPHY CHEST, ABDOMEN  AND PELVIS TECHNIQUE: Non-contrast CT of the chest was initially obtained. Multidetector CT imaging through the chest, abdomen and pelvis was performed using the standard protocol during bolus administration of intravenous contrast. Multiplanar reconstructed images and MIPs were obtained and reviewed to evaluate the vascular anatomy. CONTRAST:  126mL OMNIPAQUE IOHEXOL 350 MG/ML SOLN COMPARISON:  CT dated 11/02/2020. FINDINGS: CTA CHEST FINDINGS Cardiovascular: There is no cardiomegaly or pericardial effusion. There is coronary artery stent in the LAD. The thoracic aorta is unremarkable. The origins of the great vessels of the aortic arch appear patent. No pulmonary artery embolus identified. Mediastinum/Nodes: No hilar or mediastinal adenopathy. The esophagus is grossly unremarkable. No mediastinal fluid collection. Lungs/Pleura: Paraseptal emphysema. No focal consolidation, pleural effusion, pneumothorax. The central airways are patent. Musculoskeletal: No acute osseous pathology. Review of the MIP images  confirms the above findings. CTA ABDOMEN AND PELVIS FINDINGS VASCULAR Aorta: Normal caliber aorta without aneurysm, dissection, vasculitis or significant stenosis. Celiac: Patent without evidence of aneurysm, dissection, vasculitis or significant stenosis. SMA: Patent without evidence of aneurysm, dissection, vasculitis or significant stenosis. Renals: Both renal arteries are patent without evidence of aneurysm, dissection, vasculitis, fibromuscular dysplasia or significant stenosis. IMA: Patent without evidence of aneurysm, dissection, vasculitis or significant stenosis. Inflow: Patent without evidence of aneurysm, dissection, vasculitis or significant stenosis. Veins: No obvious venous abnormality within the limitations of this arterial phase study. Review of the MIP images confirms the above findings. NON-VASCULAR No intra-abdominal free air or free fluid. Hepatobiliary: Faintly visualized indeterminate 3.4 x 2.5 cm low attenuating lesion with peripheral enhancement in the left lobe of the liver, likely a hemangioma. This lesion however is not characterized on this CT. MRI may provide better characterization. No intrahepatic biliary ductal dilatation. The gallbladder is unremarkable. Pancreas: Unremarkable. No pancreatic ductal dilatation or surrounding inflammatory changes. Spleen: Normal in size without focal abnormality. Adrenals/Urinary Tract: The adrenal glands unremarkable. There is no hydronephrosis on either side. There is a 15 mm right renal interpolar cyst and subcentimeter left renal inferior pole hypodense lesion which is too small to characterize. The visualized ureters and urinary bladder appear unremarkable. Stomach/Bowel: There is no bowel obstruction or active inflammation. The appendix is normal. Lymphatic: No adenopathy. Reproductive: The prostate and seminal vesicles are grossly unremarkable. Other: None Musculoskeletal: Degenerative changes at L5-S1. No acute osseous pathology. Review of the  MIP images confirms the above findings. IMPRESSION: 1. No acute intrathoracic, abdominal, or pelvic pathology. No aortic aneurysm or dissection. 2. Faintly visualized indeterminate 3.4 x 2.5 cm low attenuating lesion with peripheral enhancement in the left lobe of the liver, likely a hemangioma. 3. Emphysema (ICD10-J43.9). Electronically Signed   By: Anner Crete M.D.   On: 03/17/2021 21:35    EKG: Independently reviewed.  Assessment/Plan Principal Problem:   Chest pain Active Problems:   CAD (coronary artery disease), native coronary artery   Cocaine abuse (HCC)   Chronic combined systolic and diastolic CHF (congestive heart failure) (HCC)   Heroin abuse (HCC)   HTN (hypertension)    Chest pain - Trops neg Cards not planning on re-cath at this time Likely secondary to cocaine use / coronary vasospasm Tele monitor Serial trops CP obs pathway NTG gtt CAD, chronic combined CHF - Cont plavix Cont statin Cont eliquis (looks like cards put him on this for the apical thrombus on echo 6/8). Holding BB due to acute cocaine use Cont entresto Opiate abuse - COWS Suboxone pathway Pt and RN cautioned not to start suboxone until he has significant withdrawal symptoms,  otherwise suboxone will start withdrawals Cocaine abuse - Needs to quit cocaine Explained that this is why hes having CP and HTN tonight. Holding BB for the moment  DVT prophylaxis: Eliquis Code Status: Full Family Communication: No family in room Disposition Plan: Home after CP improved Consults called: None Admission status: Place in 80    [place] [place] [facility]  How to contact the [facility] Attending or Consulting provider [place] or covering provider during after hours [place], for this patient?  Check the care team in [facility] and look for a) attending/consulting [facility] provider listed and b) the [facility] team listed Log into [email or web address]  Amion Physician Scheduling and messaging for groups and whole  hospitals  On call and physician scheduling software for group practices, residents, hospitalists and other medical providers for call, clinic, rotation and shift schedules. On[employer] is a hospital-wide system for scheduling doctors and paging doctors on call. EasyPlot is for scientific plotting and data analysis.  [email or web address]  and use [place]'s universal password to access. If you do not have the password, please contact the hospital operator.  Locate the [facility] provider you are looking for under [facility] and page to a number that you can be directly reached. If you still have difficulty reaching the provider, please page the [facility] (Director on Call) for the Hospitalists listed on amion for assistance.  03/18/2021, 12:10 AM

## 2021-03-18 NOTE — Care Management (Signed)
1517 03-18-16 Case Manager scheduled a hospital follow up appointment for this patient. Case Manager will follow for potential MATCH for medication assistance.

## 2021-03-18 NOTE — Consult Note (Addendum)
Cardiology Consultation:   Patient ID: Christopher Gibson MRN: [id number]; DOB: 1970-06-07  Admit date: 03/17/2021 Date of Consult: 03/18/2021  PCP:  Merryl Hacker No   [facility] Providers Cardiologist:  Sinclair Grooms, MD        Patient Profile:   Christopher Gibson is a 50 y.o. male with a hx of Ant. STEMI 10/13/20 with PCI, CHF, LV thrombus, tobacco abuse, cocaine abuse, HTN, HLD who is being seen 03/18/2021 for the evaluation of chest pain at the request of Dr. Bonner Puna.  History of Present Illness:   [name] was originally seen by cardiology with presentation with STEMI 10/13/20 of ant wall and emergent cath with stent to mLAD. He had non obstructive disease of the the RCA as well.  On Echo found to have LV thrombus so eliquis added.  He also had acute CHF  EF was 42% by echo and concern for compliance his d/c meds were ARB and BB.  Plans to change to entresto and adding spironolactone and farxiga but pt never came to follow up.  D/c'd on plavix, asa, eliquis BB and ARB.  He was seen in [facility] CHF clinic by Dr. Shan Levans have adjusted meds to entresto.  Possible question that statins may cause muscle aches.    Pt presented to ER 03/17/21 with chest pain and upper and lower back pain.  He was at rest when pain began.   Initially called CODE STEMI. But cancelled by cardiology.  Has used cocaine and heroin.  His chest pain was improved somewhat with sl NTG.  BP elevated 180/100 he had not had any meds for 3 days.   EKG:  The EKG was personally reviewed and demonstrates:  SR with computer read as STEMI but artifact at baseline follow up  with T wave inversions in lat leads but this is similar to old EKGs.   Telemetry:  Telemetry was personally reviewed and demonstrates:  SR  Na 134, K+ 3.4 glucose 125, LFTs WNL   Hs troponin 10; 13; 18; 20  Hgb 13.1 WBC plts 251 T chol 176 HDL 46 LDL 119 + cocaine + tetrahydrocannabinol  Neg COVID  CTA chest 03/17/21 IMPRESSION: 1. No acute intrathoracic,  abdominal, or pelvic pathology. No aortic aneurysm or dissection. 2. Faintly visualized indeterminate 3.4 x 2.5 cm low attenuating lesion with peripheral enhancement in the left lobe of the liver, likely a hemangioma. 3. Emphysema (ICD10-J43.9).  CXR no active cardiopulmonary disease.    BP 172/96, p 60 R 18 temp 99   Past Medical History:  Diagnosis Date   CAD (coronary artery disease)    Chronic combined systolic and diastolic CHF (congestive heart failure) (HCC)    Cocaine abuse (HCC)    Heroin abuse (HCC)    HTN (hypertension)    S/P angioplasty with stent 10/13/20 DES to mLAD    STEMI (ST elevation myocardial infarction) ([place]) 10/13/2020    Past Surgical History:  Procedure Laterality Date   CORONARY/GRAFT ACUTE MI REVASCULARIZATION N/A 10/13/2020   Procedure: Coronary/Graft Acute MI Revascularization;  Surgeon: Belva Crome, MD;  Location: [place] [facility];  Service: Cardiovascular;  Laterality: N/A;   LEFT HEART CATH AND CORONARY ANGIOGRAPHY N/A 10/13/2020   Procedure: LEFT HEART CATH AND CORONARY ANGIOGRAPHY;  Surgeon: Belva Crome, MD;  Location: [place] [facility];  Service: Cardiovascular;  Laterality: N/A;     Home Medications:  Prior to Admission medications   Medication Sig Start Date End Date Taking? Authorizing Provider  apixaban (ELIQUIS) 5 MG TABS tablet Take 1 tablet (5 mg total) by mouth 2 (two) times daily. 11/03/20  Yes Katherine Roan, MD  carvedilol (COREG) 6.25 MG tablet Take 1 tablet (6.25 mg total) by mouth 2 (two) times daily with a meal. 11/03/20  Yes Winfrey, Jenne Pane, MD  clopidogrel (PLAVIX) 75 MG tablet Take 1 tablet (75 mg total) by mouth daily. 11/04/20  Yes Katherine Roan, MD  nitroGLYCERIN (NITROSTAT) 0.4 MG SL tablet Place 1 tablet (0.4 mg total) under the tongue every 5 (five) minutes as needed for chest pain (CP or SOB). 10/15/20  Yes Bhagat, Bhavinkumar, PA  rosuvastatin (CRESTOR) 20 MG tablet Take 1 tablet (20 mg total) by mouth  daily. Please keep upcoming appt in November 2022 with Cardiologist before anymore refills. Thank you Final Attempt 02/14/21  Yes Belva Crome, MD  sacubitril-valsartan (ENTRESTO) 49-51 MG Take 1 tablet by mouth 2 (two) times daily. 11/03/20  Yes Katherine Roan, MD    Inpatient Medications: Scheduled Meds:  apixaban  5 mg Oral BID   [START ON 03/19/2021] buprenorphine-naloxone  1 tablet Sublingual BID   clopidogrel  75 mg Oral Daily   rosuvastatin  20 mg Oral Daily   sacubitril-valsartan  1 tablet Oral BID   Continuous Infusions:  sodium chloride     nitroGLYCERIN 15 mcg/min (03/18/21 0035)   PRN Meds: acetaminophen, buprenorphine-naloxone, nitroGLYCERIN, ondansetron (ZOFRAN) IV  Allergies:    Allergies  Allergen Reactions   Keflex [Cephalexin] Other (See Comments)    "Locks my body up"    Social History:   Social History   Socioeconomic History   Marital status: Single    Spouse name: Not on file   Number of children: Not on file   Years of education: Not on file   Highest education level: Not on file  Occupational History   Not on file  Tobacco Use   Smoking status: Every Day   Smokeless tobacco: Never  Substance and Sexual Activity   Alcohol use: Not Currently   Drug use: Yes    Types: Cocaine, Heroin   Sexual activity: Not on file  Other Topics Concern   Not on file  Social History Narrative   Not on file   Social Determinants of Health   Financial Resource Strain: High Risk   Difficulty of Paying Living Expenses: Hard  Food Insecurity: Food Insecurity Present   Worried About Running Out of Food in the Last Year: Sometimes true   Ran Out of Food in the Last Year: Sometimes true  Transportation Needs: No Transportation Needs   Lack of Transportation (Medical): No   Lack of Transportation (Non-Medical): No  Physical Activity: Not on file  Stress: Not on file  Social Connections: Not on file  Intimate Partner Violence: Not on file    Family  History:    Family History  Problem Relation Age of Onset   Coronary artery disease Neg Hx      ROS:  Please see the history of present illness.  General:no colds or fevers, no weight changes Skin:no rashes or ulcers HEENT:no blurred vision, no congestion CV:see HPI PUL:see HPI GI:no diarrhea constipation or melena, no indigestion GU:no hematuria, no dysuria MS:no joint pain, no claudication Neuro:no syncope, no lightheadedness Endo:no diabetes, no thyroid disease  All other ROS reviewed and negative.     Physical Exam/Data:   Vitals:   03/18/21 0045 03/18/21 0129 03/18/21 0427 03/18/21 0756  BP: (!) 168/93 (!) 177/99 Marland Kitchen)  160/92 (!) 172/96  Pulse: 64 64 68 67  Resp: 18 18 18 18   Temp:  99.3 F (37.4 C) 99 F (37.2 C) 99 F (37.2 C)  TempSrc:  Oral Oral Oral  SpO2: 99% 99% 99% 99%  Weight:  73.8 kg    Height:  5\' 6"  (1.676 m)      Intake/Output Summary (Last 24 hours) at 03/18/2021 1224 Last data filed at 03/18/2021 0600 Gross per 24 hour  Intake 1.34 ml  Output 1 ml  Net 0.34 ml   Last 3 Weights 03/18/2021 03/17/2021 11/18/2020  Weight (lbs) 162 lb 11.2 oz 169 lb 12.1 oz 170 lb  Weight (kg) 73.8 kg 77 kg 77.111 kg     Body mass index is 26.26 kg/m.  General:  Well nourished, well developed, in no acute distress HEENT: normal Neck: no JVD Vascular: No carotid bruits; Distal pulses 2+ bilaterally Cardiac:  normal S1, S2; RRR; no murmur gallup rub or click Lungs:  clear to auscultation bilaterally, no wheezing, rhonchi or rales  Abd: soft, nontender, no hepatomegaly  Ext: no edema Musculoskeletal:  No deformities, BUE and BLE strength normal and equal Skin: warm and dry  Neuro:  alert and oriented X 3 MAE follows commands, no focal abnormalities noted, has mild tremor and twitching at times Psych:  Normal affect   Relevant CV Studies: Echo 10/13/20 IMPRESSIONS     1. Small thrombus apex of left ventricle (see image 91). Left ventricular  ejection  fraction by 3D volume is 42 %. The left ventricle has mild to  moderately decreased function. The left ventricle has no regional wall  motion abnormalities. There is mild  left ventricular hypertrophy. Left ventricular diastolic parameters are  consistent with Grade I diastolic dysfunction (impaired relaxation).   2. Right ventricular systolic function is normal. The right ventricular  size is normal.   3. The mitral valve is normal in structure. No evidence of mitral valve  regurgitation. No evidence of mitral stenosis.   4. The aortic valve is normal in structure. Aortic valve regurgitation is  not visualized. No aortic stenosis is present.   5. The inferior vena cava is normal in size with greater than 50%  respiratory variability, suggesting right atrial pressure of 3 mmHg.   FINDINGS   Left Ventricle: Small thrombus apex of left ventricle (see image 91).  Left ventricular ejection fraction by 3D volume is 42 %. The left  ventricle has mild to moderately decreased function. The left ventricle  has no regional wall motion abnormalities.  Definity contrast agent was given IV to delineate the left ventricular  endocardial borders. The left ventricular internal cavity size was normal  in size. There is mild left ventricular hypertrophy. Left ventricular  diastolic parameters are consistent  with Grade I diastolic dysfunction (impaired relaxation).      LV Wall Scoring:  The mid and distal anterior septum, mid inferoseptal segment, apical  anterior  segment, apical inferior segment, and apex are akinetic. The mid inferior  segment is normal.   Right Ventricle: The right ventricular size is normal. No increase in  right ventricular wall thickness. Right ventricular systolic function is  normal.   Left Atrium: Left atrial size was normal in size.   Right Atrium: Right atrial size was normal in size.   Pericardium: There is no evidence of pericardial effusion.   Mitral Valve:  The mitral valve is normal in structure. No evidence of  mitral valve regurgitation. No evidence of mitral  valve stenosis.   Tricuspid Valve: The tricuspid valve is normal in structure. Tricuspid  valve regurgitation is not demonstrated. No evidence of tricuspid  stenosis.   Aortic Valve: The aortic valve is normal in structure. Aortic valve  regurgitation is not visualized. No aortic stenosis is present.   Pulmonic Valve: The pulmonic valve was normal in structure. Pulmonic valve  regurgitation is not visualized. No evidence of pulmonic stenosis.   Cardiac cath 10/13/20  A stent was successfully placed.   Late presenting anterior infarction due to occlusion of the mid LAD. Successful stenting of the mid LAD using an 18 x 2.5 Onyx postdilated to 3.5 mm in diameter with TIMI grade III flow. Widely patent left main Diffuse luminal irregularities in the proximal and mid LAD Diffuse mild to moderate nonobstructive atherosclerosis in the circumflex. 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  EF 30 to 35%.  LVEDP 24 mmHg. Acute systolic heart failure based upon hemodynamics and wall motion.   RECOMMENDATIONS:   Aggressive risk factor modification with high intensity statin therapy, screening for diabetes, blood pressure control. IV nitroglycerin for blood pressure control and to decrease LVEDP.  She did run for 12 to 24 hours Start ARB and beta-blocker therapy as tolerated by blood pressure and clinical status 2D Doppler echocardiogram to assess for apical thrombus. IV heparin will be restarted because of high index of suspicion of apical thrombus. Further management per team    Laboratory Data:  High Sensitivity Troponin:   Recent Labs  Lab 03/17/21 1926 03/17/21 2106 03/18/21 0253 03/18/21 0606  TROPONINIHS 10 13 18* 20*     Chemistry Recent Labs  Lab 03/17/21 1926  NA 134*   K 3.4*  CL 101  CO2 20*  GLUCOSE 125*  BUN 16  CREATININE 0.69  CALCIUM 9.5  GFRNONAA >60  ANIONGAP 13    Recent Labs  Lab 03/17/21 1926  PROT 7.8  ALBUMIN 3.7  AST 15  ALT 10  ALKPHOS 56  BILITOT 0.8   Lipids  Recent Labs  Lab 03/17/21 1924  CHOL 176  TRIG 57  HDL 46  LDLCALC 119*  CHOLHDL 3.8    Hematology Recent Labs  Lab 03/17/21 1926  WBC 5.9  RBC 4.45  HGB 13.1  HCT 40.0  MCV 89.9  MCH 29.4  MCHC 32.8  RDW 12.4  PLT 251   Thyroid No results for input(s): TSH, FREET4 in the last 168 hours.  BNPNo results for input(s): BNP, PROBNP in the last 168 hours.  DDimer No results for input(s): DDIMER in the last 168 hours.   Radiology/Studies:  DG Chest 2 View  Result Date: 03/17/2021 CLINICAL DATA:  Chest pain EXAM: CHEST - 2 VIEW COMPARISON:  11/18/2020 FINDINGS: The heart size and mediastinal contours are within normal limits. Both lungs are clear. The visualized skeletal structures are unremarkable. IMPRESSION: No active cardiopulmonary disease. Electronically Signed   By: Franchot Gallo M.D.   On: 03/17/2021 19:23   CT Angio Chest/Abd/Pel for Dissection W and/or Wo Contrast  Result Date: 03/17/2021 CLINICAL DATA:  Abdominal pain.  Concern for aortic dissection. EXAM: CT ANGIOGRAPHY CHEST, ABDOMEN AND PELVIS TECHNIQUE: Non-contrast CT of the chest was initially obtained. Multidetector CT imaging through the chest, abdomen and pelvis was performed using the standard protocol during bolus administration of intravenous contrast. Multiplanar reconstructed images and MIPs were obtained and reviewed to evaluate the vascular anatomy. CONTRAST:  130mL OMNIPAQUE IOHEXOL  350 MG/ML SOLN COMPARISON:  CT dated 11/02/2020. FINDINGS: CTA CHEST FINDINGS Cardiovascular: There is no cardiomegaly or pericardial effusion. There is coronary artery stent in the LAD. The thoracic aorta is unremarkable. The origins of the great vessels of the aortic arch appear patent. No  pulmonary artery embolus identified. Mediastinum/Nodes: No hilar or mediastinal adenopathy. The esophagus is grossly unremarkable. No mediastinal fluid collection. Lungs/Pleura: Paraseptal emphysema. No focal consolidation, pleural effusion, pneumothorax. The central airways are patent. Musculoskeletal: No acute osseous pathology. Review of the MIP images confirms the above findings. CTA ABDOMEN AND PELVIS FINDINGS VASCULAR Aorta: Normal caliber aorta without aneurysm, dissection, vasculitis or significant stenosis. Celiac: Patent without evidence of aneurysm, dissection, vasculitis or significant stenosis. SMA: Patent without evidence of aneurysm, dissection, vasculitis or significant stenosis. Renals: Both renal arteries are patent without evidence of aneurysm, dissection, vasculitis, fibromuscular dysplasia or significant stenosis. IMA: Patent without evidence of aneurysm, dissection, vasculitis or significant stenosis. Inflow: Patent without evidence of aneurysm, dissection, vasculitis or significant stenosis. Veins: No obvious venous abnormality within the limitations of this arterial phase study. Review of the MIP images confirms the above findings. NON-VASCULAR No intra-abdominal free air or free fluid. Hepatobiliary: Faintly visualized indeterminate 3.4 x 2.5 cm low attenuating lesion with peripheral enhancement in the left lobe of the liver, likely a hemangioma. This lesion however is not characterized on this CT. MRI may provide better characterization. No intrahepatic biliary ductal dilatation. The gallbladder is unremarkable. Pancreas: Unremarkable. No pancreatic ductal dilatation or surrounding inflammatory changes. Spleen: Normal in size without focal abnormality. Adrenals/Urinary Tract: The adrenal glands unremarkable. There is no hydronephrosis on either side. There is a 15 mm right renal interpolar cyst and subcentimeter left renal inferior pole hypodense lesion which is too small to characterize.  The visualized ureters and urinary bladder appear unremarkable. Stomach/Bowel: There is no bowel obstruction or active inflammation. The appendix is normal. Lymphatic: No adenopathy. Reproductive: The prostate and seminal vesicles are grossly unremarkable. Other: None Musculoskeletal: Degenerative changes at L5-S1. No acute osseous pathology. Review of the MIP images confirms the above findings. IMPRESSION: 1. No acute intrathoracic, abdominal, or pelvic pathology. No aortic aneurysm or dissection. 2. Faintly visualized indeterminate 3.4 x 2.5 cm low attenuating lesion with peripheral enhancement in the left lobe of the liver, likely a hemangioma. 3. Emphysema (ICD10-J43.9). Electronically Signed   By: Anner Crete M.D.   On: 03/17/2021 21:35     Assessment and Plan:   Chest pain with neg troponin and chronically abnormal EKG.  Hx of STEMI in 10/2020 and stent to LAD.  No meds for 3 days prior to admit  he is on plavix - ASA stopped due to need for eliquis for LV thrombus.   He did miss 3 days of brilinta. Would check echo to eval LV thrombus now 5 months out from STEMI. Still with mild chest pain that seems to be from hiccups.  CAD with prior stent to mLAD on plavix and eliquis LV thrombus on eliquis, no bleeding HTN uncontrolled despite coreg, entresto -  most likely due to substance abuse. Discussed importance of stopping. BB on hold due to cocaine use.  Is on IV NTG.  If no BB then amlodipine.  Echo pending. HLD on crestor, but LD is not at goal of < 70 will increase to 40 mg. Monitor for increase of muscle pain. Hiccups per IM Substance abuse, discussed importance of stopping before it causes death.   He stated he would like to go to rehab.  Will defer to IM. He has been in the past.     Risk Assessment/Risk Scores:     HEAR Score (for undifferentiated chest pain):  HEAR Score: 5          For questions or updates, please contact [facility] Please consult [email or web address] for contact  info under    Signed, Nada Boozer, NP  03/18/2021 12:24 PM   Patient seen, examined. Available data reviewed. Agree with findings, assessment, and plan as outlined by Nada Boozer, NP.  The patient is independently interviewed and examined.  He is alert, oriented, no distress.  He has intractable hiccups.  His affect is flat.  HEENT is normal, lungs are clear bilaterally, JVP is normal, normal carotid upstrokes without bruits, heart is regular rate and rhythm with no murmur gallop, abdomen soft nontender, there are no masses, extremities have no peripheral edema, skin is warm and dry with no rash, neurologic is grossly intact with 5/5 strength bilaterally.  The patient has a history of late presenting anterior MI in June 2022.  He initially was found to have LV apical thrombus and was treated with apixaban.  The patient is now about 5 months out from his infarct and presents with recurrent chest pain.  He has minimal troponin elevation with high-sensitivity troponin trend from 02-17-17-20.  This is in the setting of recurrent cocaine use and medication noncompliance.  An echocardiogram is completed with Definity contrast and demonstrates no evidence of residual LV apical thrombus.  There is periapical akinesis present.  The patient remains a poor candidate for oral anticoagulation and I think the risk/benefit of continuing him on apixaban is unfavorable.  Recommend treatment with aspirin and clopidogrel.  Otherwise would continue his current medical program for hypertension and ischemic cardiomyopathy.  This includes carvedilol and Entresto.  He does not have active symptoms of heart failure.  He is counseled about the importance of medication compliance and cessation of cocaine and polysubstance use.  The patient has been seen by the social worker and has been offered outpatient drug rehabilitation services.  Tonny Bollman, M.D. 03/18/2021 2:54 PM

## 2021-03-18 NOTE — Progress Notes (Signed)
CSW received consult for substance use resources for patient. CSW spoke with patient at bedside. CSW offered patient outpatient substance use treatment services. Patient accepted. No further questions reported at this time.

## 2021-03-18 NOTE — Plan of Care (Signed)
  Problem: Education: Goal: Knowledge of General Education information will improve Description: Including pain rating scale, medication(s)/side effects and non-pharmacologic comfort measures Reactivated   

## 2021-03-18 NOTE — Progress Notes (Signed)
Mobility Specialist Progress Note    03/18/21 1319  Mobility  Activity Ambulated in hall  Level of Assistance Independent  Assistive Device None  Distance Ambulated (ft) 470 ft  Mobility Ambulated independently in hallway  Mobility Response Tolerated well  Mobility performed by Mobility specialist  $Mobility charge 1 Mobility   Pre-Mobility: 68 HR, 168/98 BP, 100% SpO2 Post-Mobility: 74 HR  Pt received in bed and agreeable. C/o 7/10 back pain. Returned to bed with call bell in reach.   [place] Mobility Specialist  Mobility Specialist Phone: [phone]

## 2021-03-18 NOTE — Progress Notes (Addendum)
   03/18/21 1030  Clinical Encounter Type  Visited With Patient  Visit Type Initial;Spiritual support  Referral From Nurse  Consult/Referral To Chaplain    Chaplain Tery Sanfilippo responded to the consult request to assess the patient's spiritual needs. The patient made eye contact during this Chaplain's introduction. Chaplain asked open-ended questions to establish rapport and connectedness. The patient turned his head towards the window, his eyes appeared to be closed, and he gave short answers. The Chaplain asked if he wanted to rest, and the patient nodded yes; I asked if he wished to have a follow-up visit, and he nodded no. Annita Brod also consulted with the patient's attending nurse and he also stated the patient responds with short answers. This note was prepared by Deneen Harts, M.Div..  For questions please contact by phone [phone].

## 2021-03-19 ENCOUNTER — Other Ambulatory Visit: Payer: Self-pay

## 2021-03-19 ENCOUNTER — Other Ambulatory Visit (HOSPITAL_COMMUNITY): Payer: Self-pay

## 2021-03-19 DIAGNOSIS — I2510 Atherosclerotic heart disease of native coronary artery without angina pectoris: Secondary | ICD-10-CM

## 2021-03-19 DIAGNOSIS — I160 Hypertensive urgency: Secondary | ICD-10-CM

## 2021-03-19 DIAGNOSIS — R071 Chest pain on breathing: Secondary | ICD-10-CM

## 2021-03-19 LAB — BASIC METABOLIC PANEL
Anion gap: 10 (ref 5–15)
BUN: 10 mg/dL (ref 6–20)
CO2: 22 mmol/L (ref 22–32)
Calcium: 9.1 mg/dL (ref 8.9–10.3)
Chloride: 96 mmol/L — ABNORMAL LOW (ref 98–111)
Creatinine, Ser: 0.61 mg/dL (ref 0.61–1.24)
GFR, Estimated: >60 mL/min (ref >60)
Glucose, Bld: 111 mg/dL — ABNORMAL HIGH (ref 70–99)
Potassium: 3.7 mmol/L (ref 3.5–5.1)
Sodium: 128 mmol/L — ABNORMAL LOW (ref 135–145)

## 2021-03-19 MED ORDER — ROSUVASTATIN CALCIUM 40 MG PO TABS
40.0000 mg | ORAL_TABLET | Freq: Every day | ORAL | 0 refills | Status: DC
Start: 2021-03-19 — End: 2021-05-03
  Filled 2021-03-19: qty 30, 30d supply, fill #0

## 2021-03-19 MED ORDER — CARVEDILOL 6.25 MG PO TABS
6.2500 mg | ORAL_TABLET | Freq: Two times a day (BID) | ORAL | 0 refills | Status: DC
  Filled 2021-03-19: qty 60, 30d supply, fill #0

## 2021-03-19 MED ORDER — CLOPIDOGREL BISULFATE 75 MG PO TABS
75.0000 mg | ORAL_TABLET | Freq: Every day | ORAL | 0 refills | Status: DC
  Filled 2021-03-19: qty 30, 30d supply, fill #0

## 2021-03-19 MED ORDER — ENTRESTO 49-51 MG PO TABS
1.0000 | ORAL_TABLET | Freq: Two times a day (BID) | ORAL | 0 refills | Status: DC
  Filled 2021-03-19: qty 60, 30d supply, fill #0

## 2021-03-19 MED ORDER — AMLODIPINE BESYLATE 10 MG PO TABS
10.0000 mg | ORAL_TABLET | Freq: Every day | ORAL | Status: DC
  Administered 2021-03-19: 10 mg via ORAL
  Filled 2021-03-19: qty 1

## 2021-03-19 MED ORDER — CHLORPROMAZINE HCL 25 MG PO TABS
50.0000 mg | ORAL_TABLET | Freq: Once | ORAL | Status: AC
  Administered 2021-03-19: 50 mg via ORAL
  Filled 2021-03-19: qty 2

## 2021-03-19 MED ORDER — CHLORPROMAZINE HCL 50 MG PO TABS
50.0000 mg | ORAL_TABLET | Freq: Three times a day (TID) | ORAL | 0 refills | Status: DC | PRN
Start: 2021-03-19 — End: 2021-06-13
  Filled 2021-03-19: qty 21, 7d supply, fill #0

## 2021-03-19 MED ORDER — ASPIRIN 81 MG PO TBEC
81.0000 mg | DELAYED_RELEASE_TABLET | Freq: Every day | ORAL | 0 refills | Status: DC
  Filled 2021-03-19: qty 30, 30d supply, fill #0

## 2021-03-19 NOTE — Progress Notes (Signed)
Patient reports feeling anxious, sweaty, c/o headache and noted to be fidgeting lower extremities.  Also reports hiccups.  CIWA score 6.  Given scheduled Suboxone and tylenol for headache.

## 2021-03-19 NOTE — Discharge Summary (Signed)
Physician Discharge Summary  Christopher GoryKenneth J Urquidi ZOX:[id number] DOB: 25-Jun-1970 DOA: 03/17/2021  PCP: Pcp, No  Admit date: 03/17/2021 Discharge date: 03/19/2021  Admitted From: Home Disposition: Home   Recommendations for Outpatient Follow-up:  Follow up with PCP (has been scheduled for the patient prior to discharge) as well as cardiology (also scheduled prior to discharge) with repeat blood pressure monitoring and HTN management as well as CAD management. Substance and tobacco use cessation counseling to continue. Consider follow up of suspected hepatic hemangioma with dedicated MRI.  Home Health: None Equipment/Devices: None Discharge Condition: Stable CODE STATUS: Full Diet recommendation: Heart healthy  Brief/Interim Summary: Christopher Gibson is a 50 y.o. male with a history of polysubstance/cocaine abuse, STEMI s/p stent to mLAD June 2022, LV apical thrombus, HFrEF who presented to the ED 11/10 with pain in the chest and back occurring at rest. ECG was abnormal, code STEMI called but subsequently cancelled based on cardiology input. Follow up ECG revealed chronic lateral TWI. He was in sinus rhythm with elevated blood pressure, troponins 10 > 13 > 18 > 20, UDS +THC and cocaine. CTA chest revealed no significant acute pathology. Nitroglycerin infusion was started and subsequently weaned off with reintroduction of the patient's home medications. Cardiology was consulted and, after no LV thrombus was noted on repeat echo, recommended discharge with DAPT in lieu of eliquis. He appears hemodynamically stable for discharge, with etiology of chest pain felt to be hiccups for which thorazine is prescribed.    Discharge Diagnoses:  Principal Problem:   Chest pain Active Problems:   CAD (coronary artery disease), native coronary artery   Cocaine abuse (HCC)   Chronic combined systolic and diastolic CHF (congestive heart failure) (HCC)   Heroin abuse (HCC)   HTN (hypertension)   Hypertensive  urgency  Chest and back pain: Atypical presentation and cardiac enzymes are reassuring. ECG is chronically abnormal and not of acute concern per cardiology. CTA C/A/P revealed no acute intrathoracic, abdominal, or pelvic pathology. - Thorazine prescribed for hiccups. No further evaluation of management recommended at this time.    CAD s/p DES to mLAD for STEMI June 2022 by Dr. Katrinka BlazingSmith:  - Continue antiplatelet, add additional agent for DAPT, augment statin due to LDL of 119.   LV apical thrombus: Seen on echo in June.  - Repeat echo shows no further evidence of thrombus. DC eliquis.    HTN with HTN urgency:  - Weaned off NTG gtt. Continue home meds. Anticipate improvement once cocaine is out of circulation   Chronic combined HFrEF:  - Continue entresto, coreg. No diuresis currently needed. GDMT is challenging due to limited follow up and cocaine use.    Cocaine use:  - Cessation counseling provided. CSW consulted.   Emphysema:  - Tobacco cessation counseling provided. No wheezing.    Suspected hepatic hemangioma: Seen on CTA.  - Consider MRI follow up  Discharge Instructions Discharge Instructions     Diet - low sodium heart healthy   Complete by: As directed    Discharge instructions   Complete by: As directed    You were admitted for chest pain that is thought to be due to cocaine use and high blood pressure which has improved. Cardiology recommends stopping eliquis and instead taking aspirin and plavix to help keep blood thin to avoid a blockage in the coronary artery stent. There is no more evidence of the blood clot in your heart. It does not appear that you had another heart attack. To help treat  high blood pressure and heart failure, continue taking coreg, entresto. To reduce your risk of future heart attacks, take crestor 40mg  daily.   The hiccups you are experiencing are likely related to substance withdrawal and will continue to improve as long as you stay off substances.  Thorazine has been prescribed to be taken as needed for this as well.   Avoid all substances to stay healthy, and follow up with your cardiologist and primary doctor as scheduled. We have arranged follow up appointments FOR YOU in the cardiology clinic and at [facility] (see below). If your symptoms get worse, seek medical attention right away.   Increase activity slowly   Complete by: As directed       Allergies as of 03/19/2021       Reactions   Keflex [cephalexin] Other (See Comments)   "Locks my body up"        Medication List     STOP taking these medications    Eliquis 5 MG Tabs tablet Generic drug: apixaban       TAKE these medications    aspirin 81 MG EC tablet Take 1 tablet (81 mg total) by mouth daily. Start taking on: March 20, 2021   carvedilol 6.25 MG tablet Commonly known as: COREG Take 1 tablet (6.25 mg total) by mouth 2 (two) times daily with a meal.   chlorproMAZINE 50 MG tablet Commonly known as: THORAZINE Take 1 tablet (50 mg total) by mouth 3 (three) times daily as needed for hiccoughs.   clopidogrel 75 MG tablet Commonly known as: PLAVIX Take 1 tablet (75 mg total) by mouth daily.   Entresto 49-51 MG Generic drug: sacubitril-valsartan Take 1 tablet by mouth 2 (two) times daily.   nitroGLYCERIN 0.4 MG SL tablet Commonly known as: NITROSTAT Place 1 tablet (0.4 mg total) under the tongue every 5 (five) minutes as needed for chest pain (CP or SOB).   rosuvastatin 40 MG tablet Commonly known as: Crestor Take 1 tablet (40 mg total) by mouth daily. What changed:  medication strength how much to take additional instructions        Follow-up Information     Imogene Burn, PA-C Follow up on 03/23/2021.   Specialty: Cardiology Why: at 10:45 AM Contact information: [address] [place] [address] [place] Follow up on  03/30/2021.   Why: @ 9:30 am for hospital follow up with Juluis Mire. If you cannot make this scheduled appointment please call the office to reschedule. Contact information: [place] [phone]               Allergies  Allergen Reactions   Keflex [Cephalexin] Other (See Comments)    "Locks my body up"    Consultations: Cardiology  Procedures/Studies: DG Chest 2 View  Result Date: 03/17/2021 CLINICAL DATA:  Chest pain EXAM: CHEST - 2 VIEW COMPARISON:  11/18/2020 FINDINGS: The heart size and mediastinal contours are within normal limits. Both lungs are clear. The visualized skeletal structures are unremarkable. IMPRESSION: No active cardiopulmonary disease. Electronically Signed   By: Franchot Gallo M.D.   On: 03/17/2021 19:23   ECHOCARDIOGRAM COMPLETE  Result Date: 03/18/2021    ECHOCARDIOGRAM REPORT   Patient Name:   Christopher Gibson Date of Exam: 03/18/2021 Medical Rec #:  [id number]       Height:  66.0 in Accession #:    [id number]      Weight:       162.7 lb Date of Birth:  1971-02-27       BSA:          1.832 m Patient Age:    31 years        BP:           177/99 mmHg Patient Gender: M               HR:           67 bpm. Exam Location:  Inpatient Procedure: 2D Echo, Cardiac Doppler, Color Doppler and Intracardiac            Opacification Agent Indications:    CP, R/O THROMBUS  History:        Patient has prior history of Echocardiogram examinations, most                 recent 10/13/2020. CHF, CAD, Signs/Symptoms:Chest Pain; Risk                 Factors:Hypertension.  Sonographer:    Glo Herring Referring Phys: [facility]  1. LVEF 45-50%; the basal segments are moving well. Mid segments are hypokinetic. The apex is akinetic. The left ventricle demonstrates regional wall motion abnormalities (see scoring diagram/findings for description). There is mild left ventricular hypertrophy. Left ventricular  diastolic parameters are consistent with Grade II diastolic dysfunction (pseudonormalization).  2. Right ventricular systolic function is normal. The right ventricular size is normal. Tricuspid regurgitation signal is inadequate for assessing PA pressure.  3. The mitral valve is normal in structure. Trivial mitral valve regurgitation.  4. The aortic valve is normal in structure. Aortic valve regurgitation is not visualized.  5. Aortic small aortic root aneurysm.  6. The inferior vena cava is normal in size with greater than 50% respiratory variability, suggesting right atrial pressure of 3 mmHg. Conclusion(s)/Recommendation(s): No significant changes in LV function and wall motion compared to echo 10/13/2020. FINDINGS  Left Ventricle: LVEF 45-50%; the basal segments are moving well. Mid segments are hypokinetic. The apex is akinetic. The left ventricle demonstrates regional wall motion abnormalities. The left ventricular internal cavity size was normal in size. There is mild left ventricular hypertrophy. Left ventricular diastolic parameters are consistent with Grade II diastolic dysfunction (pseudonormalization). Right Ventricle: The right ventricular size is normal. No increase in right ventricular wall thickness. Right ventricular systolic function is normal. Tricuspid regurgitation signal is inadequate for assessing PA pressure. Left Atrium: Left atrial size was normal in size. Right Atrium: Right atrial size was normal in size. Pericardium: There is no evidence of pericardial effusion. Mitral Valve: The mitral valve is normal in structure. Trivial mitral valve regurgitation. Tricuspid Valve: The tricuspid valve is normal in structure. Tricuspid valve regurgitation is not demonstrated. Aortic Valve: The aortic valve is normal in structure. Aortic valve regurgitation is not visualized. Aortic valve mean gradient measures 3.0 mmHg. Aortic valve peak gradient measures 7.3 mmHg. Aortic valve area, by VTI measures 2.47  cm. Pulmonic Valve: The pulmonic valve was not well visualized. Pulmonic valve regurgitation is not visualized. Aorta: Small aortic root aneurysm. Venous: The inferior vena cava is normal in size with greater than 50% respiratory variability, suggesting right atrial pressure of 3 mmHg. IAS/Shunts: No atrial level shunt detected by color flow Doppler.  LEFT VENTRICLE PLAX 2D LVIDd:         4.60 cm  Diastology LVIDs:         3.00 cm      LV e' medial:    5.22 cm/s LV PW:         1.20 cm      LV E/e' medial:  15.1 LV IVS:        1.20 cm      LV e' lateral:   4.24 cm/s LVOT diam:     2.10 cm      LV E/e' lateral: 18.6 LV SV:         60 LV SV Index:   33 LVOT Area:     3.46 cm  LV Volumes (MOD) LV vol d, MOD A2C: 149.0 ml LV vol d, MOD A4C: 183.0 ml LV vol s, MOD A2C: 97.3 ml LV vol s, MOD A4C: 106.0 ml LV SV MOD A2C:     51.7 ml LV SV MOD A4C:     183.0 ml LV SV MOD BP:      69.8 ml RIGHT VENTRICLE             IVC RV Basal diam:  3.80 cm     IVC diam: 1.80 cm RV S prime:     17.40 cm/s LEFT ATRIUM             Index        RIGHT ATRIUM           Index LA diam:        3.00 cm 1.64 cm/m   RA Area:     12.50 cm LA Vol (A2C):   60.8 ml 33.19 ml/m  RA Volume:   25.40 ml  13.87 ml/m LA Vol (A4C):   49.3 ml 26.91 ml/m LA Biplane Vol: 59.1 ml 32.26 ml/m  AORTIC VALVE                    PULMONIC VALVE AV Area (Vmax):    2.31 cm     PV Vmax:       0.98 m/s AV Area (Vmean):   2.45 cm     PV Peak grad:  3.8 mmHg AV Area (VTI):     2.47 cm AV Vmax:           135.00 cm/s AV Vmean:          83.000 cm/s AV VTI:            0.243 m AV Peak Grad:      7.3 mmHg AV Mean Grad:      3.0 mmHg LVOT Vmax:         90.10 cm/s LVOT Vmean:        58.700 cm/s LVOT VTI:          0.173 m LVOT/AV VTI ratio: 0.71  AORTA Ao Root diam: 3.50 cm MITRAL VALVE MV Area (PHT): 3.40 cm    SHUNTS MV Decel Time: 223 msec    Systemic VTI:  0.17 m MV E velocity: 78.80 cm/s  Systemic Diam: 2.10 cm MV A velocity: 53.60 cm/s MV E/A ratio:  1.47 Curator signed by Phineas Inches Signature Date/Time: 03/18/2021/4:53:31 PM    Final    CT Angio Chest/Abd/Pel for Dissection W and/or Wo Contrast  Result Date: 03/17/2021 CLINICAL DATA:  Abdominal pain.  Concern for aortic dissection. EXAM: CT ANGIOGRAPHY CHEST, ABDOMEN AND PELVIS TECHNIQUE: Non-contrast CT of the chest was initially obtained. Multidetector CT imaging through the chest, abdomen and pelvis was performed  using the standard protocol during bolus administration of intravenous contrast. Multiplanar reconstructed images and MIPs were obtained and reviewed to evaluate the vascular anatomy. CONTRAST:  152mL OMNIPAQUE IOHEXOL 350 MG/ML SOLN COMPARISON:  CT dated 11/02/2020. FINDINGS: CTA CHEST FINDINGS Cardiovascular: There is no cardiomegaly or pericardial effusion. There is coronary artery stent in the LAD. The thoracic aorta is unremarkable. The origins of the great vessels of the aortic arch appear patent. No pulmonary artery embolus identified. Mediastinum/Nodes: No hilar or mediastinal adenopathy. The esophagus is grossly unremarkable. No mediastinal fluid collection. Lungs/Pleura: Paraseptal emphysema. No focal consolidation, pleural effusion, pneumothorax. The central airways are patent. Musculoskeletal: No acute osseous pathology. Review of the MIP images confirms the above findings. CTA ABDOMEN AND PELVIS FINDINGS VASCULAR Aorta: Normal caliber aorta without aneurysm, dissection, vasculitis or significant stenosis. Celiac: Patent without evidence of aneurysm, dissection, vasculitis or significant stenosis. SMA: Patent without evidence of aneurysm, dissection, vasculitis or significant stenosis. Renals: Both renal arteries are patent without evidence of aneurysm, dissection, vasculitis, fibromuscular dysplasia or significant stenosis. IMA: Patent without evidence of aneurysm, dissection, vasculitis or significant stenosis. Inflow: Patent without evidence of aneurysm, dissection,  vasculitis or significant stenosis. Veins: No obvious venous abnormality within the limitations of this arterial phase study. Review of the MIP images confirms the above findings. NON-VASCULAR No intra-abdominal free air or free fluid. Hepatobiliary: Faintly visualized indeterminate 3.4 x 2.5 cm low attenuating lesion with peripheral enhancement in the left lobe of the liver, likely a hemangioma. This lesion however is not characterized on this CT. MRI may provide better characterization. No intrahepatic biliary ductal dilatation. The gallbladder is unremarkable. Pancreas: Unremarkable. No pancreatic ductal dilatation or surrounding inflammatory changes. Spleen: Normal in size without focal abnormality. Adrenals/Urinary Tract: The adrenal glands unremarkable. There is no hydronephrosis on either side. There is a 15 mm right renal interpolar cyst and subcentimeter left renal inferior pole hypodense lesion which is too small to characterize. The visualized ureters and urinary bladder appear unremarkable. Stomach/Bowel: There is no bowel obstruction or active inflammation. The appendix is normal. Lymphatic: No adenopathy. Reproductive: The prostate and seminal vesicles are grossly unremarkable. Other: None Musculoskeletal: Degenerative changes at L5-S1. No acute osseous pathology. Review of the MIP images confirms the above findings. IMPRESSION: 1. No acute intrathoracic, abdominal, or pelvic pathology. No aortic aneurysm or dissection. 2. Faintly visualized indeterminate 3.4 x 2.5 cm low attenuating lesion with peripheral enhancement in the left lobe of the liver, likely a hemangioma. 3. Emphysema (ICD10-J43.9). Electronically Signed   By: Anner Crete M.D.   On: 03/17/2021 21:35    Subjective: Chest pain occurs only with hiccups. No dyspnea or other complaints at this time.   Discharge Exam: Vitals:   03/19/21 0300 03/19/21 0830  BP: (!) 159/108 (!) 154/95  Pulse: 71 69  Resp: 18 18  Temp: 98.3 F  (36.8 C) 98.5 F (36.9 C)  SpO2: 99% 99%   General: Pt is alert, awake, not in acute distress Cardiovascular: RRR, S1/S2 +, no rubs, no gallops Respiratory: CTA bilaterally, no wheezing, no rhonchi Abdominal: Soft, NT, ND, bowel sounds + Extremities: No edema, no cyanosis  Labs: BNP (last 3 results) Recent Labs    10/13/20 0315  BNP [id number]*   Basic Metabolic Panel: Recent Labs  Lab 03/17/21 1926 03/19/21 0403  NA 134* 128*  K 3.4* 3.7  CL 101 96*  CO2 20* 22  GLUCOSE 125* 111*  BUN 16 10  CREATININE 0.69 0.61  CALCIUM 9.5 9.1   Liver Function  Tests: Recent Labs  Lab 03/17/21 1926  AST 15  ALT 10  ALKPHOS 56  BILITOT 0.8  PROT 7.8  ALBUMIN 3.7   CBC: Recent Labs  Lab 03/17/21 1926  WBC 5.9  HGB 13.1  HCT 40.0  MCV 89.9  PLT 251   Hgb A1c Recent Labs    03/17/21 1924  HGBA1C 6.2*   Lipid Profile Recent Labs    03/17/21 1924  CHOL 176  HDL 46  LDLCALC 119*  TRIG 57  CHOLHDL 3.8   Microbiology Recent Results (from the past 240 hour(s))  Resp Panel by RT-PCR (Flu A&B, Covid) Nasopharyngeal Swab     Status: None   Collection Time: 03/17/21  7:24 PM   Specimen: Nasopharyngeal Swab; Nasopharyngeal(NP) swabs in vial transport medium  Result Value Ref Range Status   SARS Coronavirus 2 by RT PCR NEGATIVE NEGATIVE Final   Influenza A by PCR NEGATIVE NEGATIVE Final   Influenza B by PCR NEGATIVE NEGATIVE Final    Time coordinating discharge: Approximately 40 minutes  Tyrone Nine, MD  [facility] 03/19/2021, 9:34 AM

## 2021-03-19 NOTE — Care Management (Signed)
1505 03-19-21 Medications were sent to the [facility]. Case Manager called the [facility] to get the total cost of medications-technician stated that the patient has [employer]; however the patient does not have the card on him. The pharmacy has all Rx's except the thorazine-will have to order. Case Manager explained to the patient that he will need to call Admitting to provide the insurance card information since he does not have the card. Patient verbalized understanding. Staff RN is aware. No further needs identified at this time.

## 2021-03-19 NOTE — Progress Notes (Signed)
Progress Note  Patient Name: Christopher Gibson Date of Encounter: 03/19/2021  [facility] Cardiologist: Sinclair Grooms, MD   Subjective   Previously had emergent cath in June with stent to the mid LAD.  Nonobstructive RCA disease.  Eliquis was added because of LV thrombus.  EF was 40 to.  Plans were to eventually change to Entresto adding spironolactone or [place].  Never came to follow-up.  He was eventually seen in the clinic with Dr. Dillard Essex where his Delene Loll was added.  Statins may have caused muscle cramps.  Initially was called a code STEMI in the ER on 03/17/2021 with upper back and lower pain.  He was using cocaine and heroin.  Pain was improved with nitroglycerin.  Blood pressure was 180/100.  Not been on any meds.  The computer read out as STEMI but baseline artifact was noted.  T wave inversions in the lateral leads are similar to old EKGs.  No adverse arrhythmias on telemetry.  Troponins were all normal 10 through 20.  CTA of the chest showed no aneurysm no dissection.  Currently doing well no chest pain or shortness of breath.  Did have some trouble with hypertension.  Nitroglycerin drip had been used during this hospitalization.  Resting comfortably in bed.  Hiccups noted.  Inpatient Medications    Scheduled Meds:  amLODipine  10 mg Oral Daily   aspirin EC  81 mg Oral Daily   buprenorphine-naloxone  1 tablet Sublingual BID   carvedilol  6.25 mg Oral BID WC   clopidogrel  75 mg Oral Daily   hydrALAZINE  50 mg Oral Q8H   rosuvastatin  40 mg Oral Daily   sacubitril-valsartan  1 tablet Oral BID   Continuous Infusions:  sodium chloride     PRN Meds: acetaminophen, nitroGLYCERIN, ondansetron (ZOFRAN) IV   Vital Signs    Vitals:   03/18/21 1707 03/18/21 2031 03/19/21 0300 03/19/21 0830  BP: (!) 159/105 130/80 (!) 159/108 (!) 154/95  Pulse: 70 70 71 69  Resp:  18 18 18   Temp:  98.6 F (37 C) 98.3 F (36.8 C) 98.5 F (36.9 C)  TempSrc:  Oral Oral  Oral  SpO2: 97% 97% 99% 99%  Weight:      Height:        Intake/Output Summary (Last 24 hours) at 03/19/2021 0840 Last data filed at 03/19/2021 0545 Gross per 24 hour  Intake 480 ml  Output 2975 ml  Net -2495 ml   Last 3 Weights 03/18/2021 03/17/2021 11/18/2020  Weight (lbs) 162 lb 11.2 oz 169 lb 12.1 oz 170 lb  Weight (kg) 73.8 kg 77 kg 77.111 kg      Telemetry    Sinus rhythm, no adverse arrhythmias- Personally Reviewed  ECG    As described above- Personally Reviewed  Physical Exam   GEN: No acute distress.  Resting comfortably in bed hiccups noted Neck: No JVD Cardiac: RRR, no murmurs, rubs, or gallops.  Respiratory: Clear to auscultation bilaterally. GI: Soft, nontender, non-distended  MS: No edema; No deformity. Neuro:  Nonfocal  Psych: Normal affect   Labs    High Sensitivity Troponin:   Recent Labs  Lab 03/17/21 1926 03/17/21 2106 03/18/21 0253 03/18/21 0606  TROPONINIHS 10 13 18* 20*     Chemistry Recent Labs  Lab 03/17/21 1926 03/19/21 0403  NA 134* 128*  K 3.4* 3.7  CL 101 96*  CO2 20* 22  GLUCOSE 125* 111*  BUN 16 10  CREATININE 0.69 0.61  CALCIUM 9.5 9.1  PROT 7.8  --   ALBUMIN 3.7  --   AST 15  --   ALT 10  --   ALKPHOS 56  --   BILITOT 0.8  --   GFRNONAA >60 >60  ANIONGAP 13 10    Lipids  Recent Labs  Lab 03/17/21 1924  CHOL 176  TRIG 57  HDL 46  LDLCALC 119*  CHOLHDL 3.8    Hematology Recent Labs  Lab 03/17/21 1926  WBC 5.9  RBC 4.45  HGB 13.1  HCT 40.0  MCV 89.9  MCH 29.4  MCHC 32.8  RDW 12.4  PLT 251   Thyroid No results for input(s): TSH, FREET4 in the last 168 hours.  BNPNo results for input(s): BNP, PROBNP in the last 168 hours.  DDimer No results for input(s): DDIMER in the last 168 hours.   Radiology    DG Chest 2 View  Result Date: 03/17/2021 CLINICAL DATA:  Chest pain EXAM: CHEST - 2 VIEW COMPARISON:  11/18/2020 FINDINGS: The heart size and mediastinal contours are within normal limits. Both  lungs are clear. The visualized skeletal structures are unremarkable. IMPRESSION: No active cardiopulmonary disease. Electronically Signed   By: Franchot Gallo M.D.   On: 03/17/2021 19:23   ECHOCARDIOGRAM COMPLETE  Result Date: 03/18/2021    ECHOCARDIOGRAM REPORT   Patient Name:   Christopher Gibson Date of Exam: 03/18/2021 Medical Rec #:  [id number]       Height:       66.0 in Accession #:    [id number]      Weight:       162.7 lb Date of Birth:  05-12-1970       BSA:          1.832 m Patient Age:    50 years        BP:           177/99 mmHg Patient Gender: M               HR:           67 bpm. Exam Location:  Inpatient Procedure: 2D Echo, Cardiac Doppler, Color Doppler and Intracardiac            Opacification Agent Indications:    CP, R/O THROMBUS  History:        Patient has prior history of Echocardiogram examinations, most                 recent 10/13/2020. CHF, CAD, Signs/Symptoms:Chest Pain; Risk                 Factors:Hypertension.  Sonographer:    Glo Herring Referring Phys: Hand  1. LVEF 45-50%; the basal segments are moving well. Mid segments are hypokinetic. The apex is akinetic. The left ventricle demonstrates regional wall motion abnormalities (see scoring diagram/findings for description). There is mild left ventricular hypertrophy. Left ventricular diastolic parameters are consistent with Grade II diastolic dysfunction (pseudonormalization).  2. Right ventricular systolic function is normal. The right ventricular size is normal. Tricuspid regurgitation signal is inadequate for assessing PA pressure.  3. The mitral valve is normal in structure. Trivial mitral valve regurgitation.  4. The aortic valve is normal in structure. Aortic valve regurgitation is not visualized.  5. Aortic small aortic root aneurysm.  6. The inferior vena cava is normal in size with greater than 50% respiratory variability, suggesting right atrial pressure of 3 mmHg.  Conclusion(s)/Recommendation(s): No significant  changes in LV function and wall motion compared to echo 10/13/2020. FINDINGS  Left Ventricle: LVEF 45-50%; the basal segments are moving well. Mid segments are hypokinetic. The apex is akinetic. The left ventricle demonstrates regional wall motion abnormalities. The left ventricular internal cavity size was normal in size. There is mild left ventricular hypertrophy. Left ventricular diastolic parameters are consistent with Grade II diastolic dysfunction (pseudonormalization). Right Ventricle: The right ventricular size is normal. No increase in right ventricular wall thickness. Right ventricular systolic function is normal. Tricuspid regurgitation signal is inadequate for assessing PA pressure. Left Atrium: Left atrial size was normal in size. Right Atrium: Right atrial size was normal in size. Pericardium: There is no evidence of pericardial effusion. Mitral Valve: The mitral valve is normal in structure. Trivial mitral valve regurgitation. Tricuspid Valve: The tricuspid valve is normal in structure. Tricuspid valve regurgitation is not demonstrated. Aortic Valve: The aortic valve is normal in structure. Aortic valve regurgitation is not visualized. Aortic valve mean gradient measures 3.0 mmHg. Aortic valve peak gradient measures 7.3 mmHg. Aortic valve area, by VTI measures 2.47 cm. Pulmonic Valve: The pulmonic valve was not well visualized. Pulmonic valve regurgitation is not visualized. Aorta: Small aortic root aneurysm. Venous: The inferior vena cava is normal in size with greater than 50% respiratory variability, suggesting right atrial pressure of 3 mmHg. IAS/Shunts: No atrial level shunt detected by color flow Doppler.  LEFT VENTRICLE PLAX 2D LVIDd:         4.60 cm      Diastology LVIDs:         3.00 cm      LV e' medial:    5.22 cm/s LV PW:         1.20 cm      LV E/e' medial:  15.1 LV IVS:        1.20 cm      LV e' lateral:   4.24 cm/s LVOT diam:     2.10 cm       LV E/e' lateral: 18.6 LV SV:         60 LV SV Index:   33 LVOT Area:     3.46 cm  LV Volumes (MOD) LV vol d, MOD A2C: 149.0 ml LV vol d, MOD A4C: 183.0 ml LV vol s, MOD A2C: 97.3 ml LV vol s, MOD A4C: 106.0 ml LV SV MOD A2C:     51.7 ml LV SV MOD A4C:     183.0 ml LV SV MOD BP:      69.8 ml RIGHT VENTRICLE             IVC RV Basal diam:  3.80 cm     IVC diam: 1.80 cm RV S prime:     17.40 cm/s LEFT ATRIUM             Index        RIGHT ATRIUM           Index LA diam:        3.00 cm 1.64 cm/m   RA Area:     12.50 cm LA Vol (A2C):   60.8 ml 33.19 ml/m  RA Volume:   25.40 ml  13.87 ml/m LA Vol (A4C):   49.3 ml 26.91 ml/m LA Biplane Vol: 59.1 ml 32.26 ml/m  AORTIC VALVE                    PULMONIC VALVE AV Area (Vmax):    2.31 cm  PV Vmax:       0.98 m/s AV Area (Vmean):   2.45 cm     PV Peak grad:  3.8 mmHg AV Area (VTI):     2.47 cm AV Vmax:           135.00 cm/s AV Vmean:          83.000 cm/s AV VTI:            0.243 m AV Peak Grad:      7.3 mmHg AV Mean Grad:      3.0 mmHg LVOT Vmax:         90.10 cm/s LVOT Vmean:        58.700 cm/s LVOT VTI:          0.173 m LVOT/AV VTI ratio: 0.71  AORTA Ao Root diam: 3.50 cm MITRAL VALVE MV Area (PHT): 3.40 cm    SHUNTS MV Decel Time: 223 msec    Systemic VTI:  0.17 m MV E velocity: 78.80 cm/s  Systemic Diam: 2.10 cm MV A velocity: 53.60 cm/s MV E/A ratio:  1.47 Landscape architect signed by Phineas Inches Signature Date/Time: 03/18/2021/4:53:31 PM    Final    CT Angio Chest/Abd/Pel for Dissection W and/or Wo Contrast  Result Date: 03/17/2021 CLINICAL DATA:  Abdominal pain.  Concern for aortic dissection. EXAM: CT ANGIOGRAPHY CHEST, ABDOMEN AND PELVIS TECHNIQUE: Non-contrast CT of the chest was initially obtained. Multidetector CT imaging through the chest, abdomen and pelvis was performed using the standard protocol during bolus administration of intravenous contrast. Multiplanar reconstructed images and MIPs were obtained and reviewed to evaluate the  vascular anatomy. CONTRAST:  18mL OMNIPAQUE IOHEXOL 350 MG/ML SOLN COMPARISON:  CT dated 11/02/2020. FINDINGS: CTA CHEST FINDINGS Cardiovascular: There is no cardiomegaly or pericardial effusion. There is coronary artery stent in the LAD. The thoracic aorta is unremarkable. The origins of the great vessels of the aortic arch appear patent. No pulmonary artery embolus identified. Mediastinum/Nodes: No hilar or mediastinal adenopathy. The esophagus is grossly unremarkable. No mediastinal fluid collection. Lungs/Pleura: Paraseptal emphysema. No focal consolidation, pleural effusion, pneumothorax. The central airways are patent. Musculoskeletal: No acute osseous pathology. Review of the MIP images confirms the above findings. CTA ABDOMEN AND PELVIS FINDINGS VASCULAR Aorta: Normal caliber aorta without aneurysm, dissection, vasculitis or significant stenosis. Celiac: Patent without evidence of aneurysm, dissection, vasculitis or significant stenosis. SMA: Patent without evidence of aneurysm, dissection, vasculitis or significant stenosis. Renals: Both renal arteries are patent without evidence of aneurysm, dissection, vasculitis, fibromuscular dysplasia or significant stenosis. IMA: Patent without evidence of aneurysm, dissection, vasculitis or significant stenosis. Inflow: Patent without evidence of aneurysm, dissection, vasculitis or significant stenosis. Veins: No obvious venous abnormality within the limitations of this arterial phase study. Review of the MIP images confirms the above findings. NON-VASCULAR No intra-abdominal free air or free fluid. Hepatobiliary: Faintly visualized indeterminate 3.4 x 2.5 cm low attenuating lesion with peripheral enhancement in the left lobe of the liver, likely a hemangioma. This lesion however is not characterized on this CT. MRI may provide better characterization. No intrahepatic biliary ductal dilatation. The gallbladder is unremarkable. Pancreas: Unremarkable. No pancreatic  ductal dilatation or surrounding inflammatory changes. Spleen: Normal in size without focal abnormality. Adrenals/Urinary Tract: The adrenal glands unremarkable. There is no hydronephrosis on either side. There is a 15 mm right renal interpolar cyst and subcentimeter left renal inferior pole hypodense lesion which is too small to characterize. The visualized ureters and urinary bladder appear unremarkable. Stomach/Bowel: There is  no bowel obstruction or active inflammation. The appendix is normal. Lymphatic: No adenopathy. Reproductive: The prostate and seminal vesicles are grossly unremarkable. Other: None Musculoskeletal: Degenerative changes at L5-S1. No acute osseous pathology. Review of the MIP images confirms the above findings. IMPRESSION: 1. No acute intrathoracic, abdominal, or pelvic pathology. No aortic aneurysm or dissection. 2. Faintly visualized indeterminate 3.4 x 2.5 cm low attenuating lesion with peripheral enhancement in the left lobe of the liver, likely a hemangioma. 3. Emphysema (ICD10-J43.9). Electronically Signed   By: Anner Crete M.D.   On: 03/17/2021 21:35    Cardiac Studies   Echo done 11/11-EF 45 to 50% mid segments hypokinetic.  No significant change from 6/8.  Cardiac cath 10/13/2020 as described above.  Mid LAD stent.  Patient Profile     50 y.o. male with prior anterior STEMI on 10/13/2020 with percutaneous intervention, LV thrombus, CHF tobacco use cocaine use hypertension hyperlipidemia here with chest pain.  Assessment & Plan    Chest pain with negative troponin and abnormal EKG - Prior history of STEMI in June 2022 with stent to the mid LAD.  He is not taking any medications for the past 3 days.  Previously he was on Plavix.  Aspirin was stopped previously because of Eliquis needs for LV thrombus.  There is no evidence of LV thrombus on echocardiogram.  Thankfully.  Mild chest pain seem to be from hiccups.  Not unreasonable to continue with carvedilol given the  alpha blockade even in the setting of his cocaine use for overall risk factor modification.  CAD - Mid LAD stent Plavix Eliquis.  LV thrombus - Seems to have resolved.  No evidence on echocardiogram.  We will discontinue the Eliquis.  Continue with Plavix and aspirin.  Hyperlipidemia - On Crestor.  LDL goal less than 70.  Increase to 40.  Monitor for muscle discomfort.  He may not be taking this medications.  Polysubstance abuse including cocaine - Understands high risk behavior, may cause death.  Essential hypertension - Challenging to control especially with cocaine use and medication noncompliance.  Go ahead and resume his home medicines as previously prescribed.  Hopefully he will be able to take these.  From a cardiology perspective given his negative troponins, resolved LV thrombus I am comfortable with discharge later today.  Discussed with Dr. Bonner Puna.  Maintain follow-up as previously scheduled.  We will go ahead and sign off.  For questions or updates, please contact [place] Please consult [email or web address] for contact info under        Signed, Candee Furbish, MD  03/19/2021, 8:40 AM

## 2021-03-19 NOTE — Progress Notes (Signed)
Weaned pt off nitro per MD note. Pt's BP increased to 159/108. Notified Hospitalist on call [place]. Restarted Nitro per MD

## 2021-03-21 ENCOUNTER — Other Ambulatory Visit (HOSPITAL_COMMUNITY): Payer: Self-pay

## 2021-03-22 ENCOUNTER — Other Ambulatory Visit (HOSPITAL_COMMUNITY): Payer: Self-pay

## 2021-03-23 ENCOUNTER — Ambulatory Visit: Payer: Self-pay | Admitting: Physician Assistant

## 2021-03-30 ENCOUNTER — Other Ambulatory Visit (HOSPITAL_COMMUNITY): Payer: Self-pay

## 2021-03-30 ENCOUNTER — Inpatient Hospital Stay (INDEPENDENT_AMBULATORY_CARE_PROVIDER_SITE_OTHER): Payer: Self-pay | Admitting: Primary Care

## 2021-04-21 ENCOUNTER — Other Ambulatory Visit (HOSPITAL_COMMUNITY): Payer: Self-pay

## 2021-04-22 ENCOUNTER — Other Ambulatory Visit (HOSPITAL_COMMUNITY): Payer: Self-pay

## 2021-04-25 ENCOUNTER — Other Ambulatory Visit (HOSPITAL_COMMUNITY): Payer: Self-pay

## 2021-04-26 ENCOUNTER — Other Ambulatory Visit (HOSPITAL_COMMUNITY): Payer: Self-pay

## 2021-04-27 ENCOUNTER — Other Ambulatory Visit (HOSPITAL_COMMUNITY): Payer: Self-pay

## 2021-05-02 ENCOUNTER — Emergency Department (HOSPITAL_COMMUNITY): Payer: Self-pay

## 2021-05-02 ENCOUNTER — Emergency Department (HOSPITAL_COMMUNITY)
Admission: EM | Admit: 2021-05-02 | Discharge: 2021-05-03 | Disposition: A | Discharge status: Home / Self Care | Payer: Self-pay | Attending: Emergency Medicine | Admitting: Emergency Medicine

## 2021-05-02 ENCOUNTER — Other Ambulatory Visit: Payer: Self-pay

## 2021-05-02 DIAGNOSIS — Z7902 Long term (current) use of antithrombotics/antiplatelets: Secondary | ICD-10-CM | POA: Insufficient documentation

## 2021-05-02 DIAGNOSIS — R0789 Other chest pain: Secondary | ICD-10-CM | POA: Insufficient documentation

## 2021-05-02 DIAGNOSIS — R059 Cough, unspecified: Secondary | ICD-10-CM | POA: Insufficient documentation

## 2021-05-02 DIAGNOSIS — I2510 Atherosclerotic heart disease of native coronary artery without angina pectoris: Secondary | ICD-10-CM | POA: Insufficient documentation

## 2021-05-02 DIAGNOSIS — F17200 Nicotine dependence, unspecified, uncomplicated: Secondary | ICD-10-CM | POA: Insufficient documentation

## 2021-05-02 DIAGNOSIS — M25512 Pain in left shoulder: Secondary | ICD-10-CM | POA: Insufficient documentation

## 2021-05-02 DIAGNOSIS — Z7982 Long term (current) use of aspirin: Secondary | ICD-10-CM | POA: Insufficient documentation

## 2021-05-02 DIAGNOSIS — I110 Hypertensive heart disease with heart failure: Secondary | ICD-10-CM | POA: Insufficient documentation

## 2021-05-02 DIAGNOSIS — Z955 Presence of coronary angioplasty implant and graft: Secondary | ICD-10-CM | POA: Insufficient documentation

## 2021-05-02 DIAGNOSIS — I5042 Chronic combined systolic (congestive) and diastolic (congestive) heart failure: Secondary | ICD-10-CM | POA: Insufficient documentation

## 2021-05-02 DIAGNOSIS — Z79899 Other long term (current) drug therapy: Secondary | ICD-10-CM | POA: Insufficient documentation

## 2021-05-02 DIAGNOSIS — R0600 Dyspnea, unspecified: Secondary | ICD-10-CM | POA: Insufficient documentation

## 2021-05-02 LAB — CBC WITH DIFFERENTIAL/PLATELET
Abs Immature Granulocytes: 0.01 10*3/uL (ref 0.00–0.07)
Basophils Absolute: 0 10*3/uL (ref 0.0–0.1)
Basophils Relative: 1 %
Eosinophils Absolute: 0.1 10*3/uL (ref 0.0–0.5)
Eosinophils Relative: 2 %
HCT: 39.4 % (ref 39.0–52.0)
Hemoglobin: 12.5 g/dL — ABNORMAL LOW (ref 13.0–17.0)
Immature Granulocytes: 0 %
Lymphocytes Relative: 35 %
Lymphs Abs: 1.4 10*3/uL (ref 0.7–4.0)
MCH: 29.9 pg (ref 26.0–34.0)
MCHC: 31.7 g/dL (ref 30.0–36.0)
MCV: 94.3 fL (ref 80.0–100.0)
Monocytes Absolute: 0.4 10*3/uL (ref 0.1–1.0)
Monocytes Relative: 11 %
Neutro Abs: 2.1 10*3/uL (ref 1.7–7.7)
Neutrophils Relative %: 51 %
Platelets: 218 10*3/uL (ref 150–400)
RBC: 4.18 MIL/uL — ABNORMAL LOW (ref 4.22–5.81)
RDW: 13.6 % (ref 11.5–15.5)
WBC: 4 10*3/uL (ref 4.0–10.5)
nRBC: 0 % (ref 0.0–0.2)

## 2021-05-02 LAB — COMPREHENSIVE METABOLIC PANEL
ALT: 11 U/L (ref 0–44)
AST: 13 U/L — ABNORMAL LOW (ref 15–41)
Albumin: 3.7 g/dL (ref 3.5–5.0)
Alkaline Phosphatase: 54 U/L (ref 38–126)
Anion gap: 7 (ref 5–15)
BUN: 13 mg/dL (ref 6–20)
CO2: 24 mmol/L (ref 22–32)
Calcium: 8.9 mg/dL (ref 8.9–10.3)
Chloride: 107 mmol/L (ref 98–111)
Creatinine, Ser: 0.84 mg/dL (ref 0.61–1.24)
GFR, Estimated: >60 mL/min (ref >60)
Glucose, Bld: 97 mg/dL (ref 70–99)
Potassium: 3.6 mmol/L (ref 3.5–5.1)
Sodium: 138 mmol/L (ref 135–145)
Total Bilirubin: 0.8 mg/dL (ref 0.3–1.2)
Total Protein: 7.6 g/dL (ref 6.5–8.1)

## 2021-05-02 LAB — TROPONIN I (HIGH SENSITIVITY): Troponin I (High Sensitivity): 10 ng/L (ref <18)

## 2021-05-02 NOTE — ED Provider Notes (Signed)
Emergency Medicine Provider Triage Evaluation Note  Christopher Gibson , a 50 y.o. male  was evaluated in triage.  Pt complains of chest pain for the last few days. Left  chest/back into the arm some, worse with any type of movement including movement of the UE and neck and worse with coughing. Concerned regarding this pain with his hx of prior MI. Having some associated dyspnea.   Review of Systems  Positive: Chest pain, dyspnea Negative: Nausea, vomiting, diaphoresis, syncope, hemoptysis  Physical Exam  BP (!) 159/113 (BP Location: Left Arm)    Pulse 86    Temp 97.9 F (36.6 C)    Resp 17    Ht 5\' 6"  (1.676 m)    Wt 79.8 kg    SpO2 100%    BMI 28.41 kg/m  Gen:   Awake, no distress   Resp:  Normal effort  MSK:   Moves extremities without difficulty  Other  Heart RRR, breath sounds present bilaterally, left anterior chest wall TTP.   Medical Decision Making  Medically screening exam initiated at 10:06 PM.  Appropriate orders placed.  RMANI KELLOGG was informed that the remainder of the evaluation will be completed by another provider, this initial triage assessment does not replace that evaluation, and the importance of remaining in the ED until their evaluation is complete.  Chest pain   Amedeo Gory 05/02/21 2217    2218, MD 05/02/21 2317

## 2021-05-02 NOTE — ED Triage Notes (Signed)
Pt c/o chest pain that started 3 days ago. Pt states the pain radiates to his back and left arm. Pt reports he had a heart attack 8 months ago and had a stent placed.

## 2021-05-03 ENCOUNTER — Other Ambulatory Visit (HOSPITAL_COMMUNITY): Payer: Self-pay

## 2021-05-03 LAB — TROPONIN I (HIGH SENSITIVITY): Troponin I (High Sensitivity): 9 ng/L (ref <18)

## 2021-05-03 MED ORDER — ASPIRIN 81 MG PO TBEC
81.0000 mg | DELAYED_RELEASE_TABLET | Freq: Every day | ORAL | 0 refills | Status: DC
  Filled 2021-05-03: qty 30, 30d supply, fill #0

## 2021-05-03 MED ORDER — KETOROLAC TROMETHAMINE 60 MG/2ML IM SOLN
30.0000 mg | Freq: Once | INTRAMUSCULAR | Status: AC
  Administered 2021-05-03: 09:00:00 30 mg via INTRAMUSCULAR
  Filled 2021-05-03: qty 2

## 2021-05-03 MED ORDER — NAPROXEN 500 MG PO TABS
500.0000 mg | ORAL_TABLET | Freq: Two times a day (BID) | ORAL | 0 refills | Status: DC
  Filled 2021-05-03: qty 30, 15d supply, fill #0

## 2021-05-03 MED ORDER — CLOPIDOGREL BISULFATE 75 MG PO TABS
75.0000 mg | ORAL_TABLET | Freq: Every day | ORAL | 0 refills | Status: DC
  Filled 2021-05-03: qty 30, 30d supply, fill #0

## 2021-05-03 MED ORDER — ROSUVASTATIN CALCIUM 40 MG PO TABS
40.0000 mg | ORAL_TABLET | Freq: Every day | ORAL | 0 refills | Status: DC
  Filled 2021-05-03: qty 30, 30d supply, fill #0

## 2021-05-03 MED ORDER — ENTRESTO 49-51 MG PO TABS
1.0000 | ORAL_TABLET | Freq: Two times a day (BID) | ORAL | 0 refills | Status: DC
Start: 2021-05-03 — End: 2021-06-13
  Filled 2021-05-03: qty 60, 30d supply, fill #0

## 2021-05-03 MED ORDER — CARVEDILOL 6.25 MG PO TABS
6.2500 mg | ORAL_TABLET | Freq: Two times a day (BID) | ORAL | 0 refills | Status: DC
  Filled 2021-05-03: qty 60, 30d supply, fill #0

## 2021-05-03 NOTE — ED Provider Notes (Signed)
[facility] EMERGENCY DEPARTMENT Provider Note  CSN: [id number] Arrival date & time: 05/02/21 2146    History Chief Complaint  Patient presents with   Chest Pain    Christopher Gibson is a 50 y.o. male with history of cocaine and heroin abuse (states hasn't used in a month) had a STEMI with LAD stent in Jun 2022. He reports 4 days of L chest pain, radiating into his L shoulder, worse with movement and cough. No associated fever. He had a similar presentation in Nov 2022, mild increase in Trop then, admitted with negative workup and ultimately discharged. He reports he has been out of his medications because he has not had a refill called in yet. He states he has not had a Cardiology clinic follow up since he was discharged from the hospital.    Past Medical History:  Diagnosis Date   CAD (coronary artery disease)    Chronic combined systolic and diastolic CHF (congestive heart failure) (HCC)    Cocaine abuse (HCC)    Heroin abuse (HCC)    HTN (hypertension)    S/P angioplasty with stent 10/13/20 DES to mLAD    STEMI (ST elevation myocardial infarction) (HCC) 10/13/2020    Past Surgical History:  Procedure Laterality Date   CORONARY/GRAFT ACUTE MI REVASCULARIZATION N/A 10/13/2020   Procedure: Coronary/Graft Acute MI Revascularization;  Surgeon: Lyn Records, MD;  Location: [facility] INVASIVE CV [facility];  Service: Cardiovascular;  Laterality: N/A;   LEFT HEART CATH AND CORONARY ANGIOGRAPHY N/A 10/13/2020   Procedure: LEFT HEART CATH AND CORONARY ANGIOGRAPHY;  Surgeon: Lyn Records, MD;  Location: [facility] INVASIVE CV [facility];  Service: Cardiovascular;  Laterality: N/A;    Family History  Problem Relation Age of Onset   Coronary artery disease Neg Hx     Social History   Tobacco Use   Smoking status: Every Day   Smokeless tobacco: Never  Substance Use Topics   Alcohol use: Not Currently   Drug use: Yes    Types: Cocaine, Heroin     Home Medications Prior to Admission medications    Medication Sig Start Date End Date Taking? Authorizing Provider  naproxen (NAPROSYN) 500 MG tablet Take 1 tablet (500 mg total) by mouth 2 (two) times daily. 05/03/21  Yes Pollyann Savoy, MD  aspirin 81 MG EC tablet Take 1 tablet (81 mg total) by mouth daily. 05/03/21   Pollyann Savoy, MD  carvedilol (COREG) 6.25 MG tablet Take 1 tablet (6.25 mg total) by mouth 2 (two) times daily with a meal. 05/03/21   Pollyann Savoy, MD  chlorproMAZINE (THORAZINE) 50 MG tablet Take 1 tablet (50 mg total) by mouth 3 (three) times daily as needed for hiccoughs. 03/19/21   Tyrone Nine, MD  clopidogrel (PLAVIX) 75 MG tablet Take 1 tablet (75 mg total) by mouth daily. 05/03/21   Pollyann Savoy, MD  nitroGLYCERIN (NITROSTAT) 0.4 MG SL tablet Place 1 tablet (0.4 mg total) under the tongue every 5 (five) minutes as needed for chest pain (CP or SOB). 10/15/20   Bhagat, Sharrell Ku, PA  rosuvastatin (CRESTOR) 40 MG tablet Take 1 tablet (40 mg total) by mouth daily. 05/03/21   Pollyann Savoy, MD  sacubitril-valsartan (ENTRESTO) 49-51 MG Take 1 tablet by mouth 2 (two) times daily. 05/03/21   Pollyann Savoy, MD     Allergies    Keflex [cephalexin]   Review of Systems   Review of Systems A comprehensive review of systems was completed and  negative except as noted in HPI.    Physical Exam BP 128/77    Pulse 62    Temp 98.3 F (36.8 C) (Oral)    Resp 11    Ht 5\' 6"  (1.676 m)    Wt 79.8 kg    SpO2 100%    BMI 28.41 kg/m   Physical Exam Vitals and nursing note reviewed.  Constitutional:      Appearance: Normal appearance.  HENT:     Head: Normocephalic and atraumatic.     Nose: Nose normal.     Mouth/Throat:     Mouth: Mucous membranes are moist.  Eyes:     Extraocular Movements: Extraocular movements intact.     Conjunctiva/sclera: Conjunctivae normal.  Cardiovascular:     Rate and Rhythm: Normal rate.  Pulmonary:     Effort: Pulmonary effort is normal.     Breath sounds: Normal  breath sounds.  Chest:     Chest wall: Tenderness (L parasternal, reproduces pain) present.  Abdominal:     General: Abdomen is flat.     Palpations: Abdomen is soft.     Tenderness: There is no abdominal tenderness.  Musculoskeletal:        General: No swelling. Normal range of motion.     Cervical back: Neck supple.     Right lower leg: No edema.     Left lower leg: No edema.     Comments: L shoulder/trapezius tenderness  Skin:    General: Skin is warm and dry.  Neurological:     General: No focal deficit present.     Mental Status: He is alert.  Psychiatric:        Mood and Affect: Mood normal.     ED Results / Procedures / Treatments   Labs (all labs ordered are listed, but only abnormal results are displayed) Labs Reviewed  CBC WITH DIFFERENTIAL/PLATELET - Abnormal; Notable for the following components:      Result Value   RBC 4.18 (*)    Hemoglobin 12.5 (*)    All other components within normal limits  COMPREHENSIVE METABOLIC PANEL - Abnormal; Notable for the following components:   AST 13 (*)    All other components within normal limits  TROPONIN I (HIGH SENSITIVITY)  TROPONIN I (HIGH SENSITIVITY)    EKG EKG Interpretation  Date/Time:  Monday May 02 2021 21:50:46 EST Ventricular Rate:  84 PR Interval:  170 QRS Duration: 98 QT Interval:  356 QTC Calculation: 420 R Axis:   148 Text Interpretation: Normal sinus rhythm Incomplete right bundle branch block Cannot rule out Inferior infarct , age undetermined Anterolateral infarct , age undetermined Abnormal ECG Rightward axis When compared with ECG of 03/17/2021, Rightward axis is now present QT has shortened Confirmed by 13/02/2021 (Dione Booze) on 05/03/2021 12:05:20 AM  Radiology DG Chest 2 View  Result Date: 05/02/2021 CLINICAL DATA:  Left-sided chest pain EXAM: CHEST - 2 VIEW COMPARISON:  03/17/2021 FINDINGS: Minimal scarring or atelectasis left base. No consolidation or effusion. Normal cardiac size. No  pneumothorax. IMPRESSION: No active cardiopulmonary disease. Electronically Signed   By: 13/02/2021 M.D.   On: 05/02/2021 23:25    Procedures Procedures  Medications Ordered in the ED Medications  ketorolac (TORADOL) injection 30 mg (30 mg Intramuscular Given 05/03/21 0912)     MDM Rules/Calculators/A&P MDM Patient with history cocaine/heroin abuse reports he has been abstaining. Had a STEMI in early June 2022, late presentation, had a Stent to LAD and found to have  a mural thrombus, subsequently readmitted in late June where meds were adjusted. Readmitted again in Nov 2022, workup then was unremarkable, mural thrombus had resolved and eliquis was stopped. He was still taking Plavix and ASA in addition to Entresto Carvedilol and Statin but has not been able to get a refill since his hospital discharge in November. His pain today is MSK, no ischemic changes on EKG (T wave inversions are improving) and normal Trop x 2 after several days of pain. Will give Toradol for pain here, anticipate discharge with refill of his medications and referral to Cardiology for outpatient follow up.   ED Course  I have reviewed the triage vital signs and the nursing notes.  Pertinent labs & imaging results that were available during my care of the patient were reviewed by me and considered in my medical decision making (see chart for details).  Clinical Course as of 05/03/21 1431  Tue May 03, 2021  0939 Pain improved some with Toradol, still hurting to move but no signs of ACS, no concern for PE with normal vitals. No evidence of PNA or other infectious cause. Will d/c with Rx for his medications and referral to cardiology. [CS]    Clinical Course User Index [CS] Pollyann Savoy, MD    Final Clinical Impression(s) / ED Diagnoses Final diagnoses:  Chest wall pain    Rx / DC Orders ED Discharge Orders          Ordered    aspirin 81 MG EC tablet  Daily        05/03/21 0943    carvedilol (COREG)  6.25 MG tablet  2 times daily with meals       Note to Pharmacy: HF Fund   05/03/21 0943    clopidogrel (PLAVIX) 75 MG tablet  Daily       Note to Pharmacy: Hf fund   05/03/21 0943    rosuvastatin (CRESTOR) 40 MG tablet  Daily        05/03/21 0943    sacubitril-valsartan (ENTRESTO) 49-51 MG  2 times daily       Note to Pharmacy: HF Fund   05/03/21 0943    naproxen (NAPROSYN) 500 MG tablet  2 times daily        05/03/21 1009             Pollyann Savoy, MD 05/03/21 1432

## 2021-06-08 ENCOUNTER — Emergency Department (HOSPITAL_COMMUNITY): Payer: Self-pay

## 2021-06-08 ENCOUNTER — Other Ambulatory Visit (HOSPITAL_COMMUNITY): Payer: Self-pay

## 2021-06-08 ENCOUNTER — Inpatient Hospital Stay (HOSPITAL_COMMUNITY): Payer: Self-pay

## 2021-06-08 ENCOUNTER — Other Ambulatory Visit: Payer: Self-pay

## 2021-06-08 ENCOUNTER — Encounter (HOSPITAL_COMMUNITY): Payer: Self-pay | Admitting: Emergency Medicine

## 2021-06-08 ENCOUNTER — Inpatient Hospital Stay (HOSPITAL_COMMUNITY)
Admission: EM | Admit: 2021-06-08 | Discharge: 2021-06-13 | DRG: 280 | Disposition: A | Discharge status: Home / Self Care | Payer: Self-pay | Attending: Internal Medicine | Admitting: Internal Medicine

## 2021-06-08 ENCOUNTER — Encounter (HOSPITAL_COMMUNITY)
Admission: EM | Disposition: A | Discharge status: Home / Self Care | Payer: Self-pay | Source: Home / Self Care | Attending: Internal Medicine

## 2021-06-08 DIAGNOSIS — I2102 ST elevation (STEMI) myocardial infarction involving left anterior descending coronary artery: Secondary | ICD-10-CM

## 2021-06-08 DIAGNOSIS — E785 Hyperlipidemia, unspecified: Secondary | ICD-10-CM

## 2021-06-08 DIAGNOSIS — N289 Disorder of kidney and ureter, unspecified: Secondary | ICD-10-CM | POA: Diagnosis present

## 2021-06-08 DIAGNOSIS — I110 Hypertensive heart disease with heart failure: Secondary | ICD-10-CM | POA: Diagnosis present

## 2021-06-08 DIAGNOSIS — I959 Hypotension, unspecified: Secondary | ICD-10-CM | POA: Diagnosis not present

## 2021-06-08 DIAGNOSIS — I5023 Acute on chronic systolic (congestive) heart failure: Secondary | ICD-10-CM | POA: Diagnosis present

## 2021-06-08 DIAGNOSIS — Z7982 Long term (current) use of aspirin: Secondary | ICD-10-CM

## 2021-06-08 DIAGNOSIS — I2109 ST elevation (STEMI) myocardial infarction involving other coronary artery of anterior wall: Secondary | ICD-10-CM | POA: Diagnosis present

## 2021-06-08 DIAGNOSIS — Y831 Surgical operation with implant of artificial internal device as the cause of abnormal reaction of the patient, or of later complication, without mention of misadventure at the time of the procedure: Secondary | ICD-10-CM | POA: Diagnosis present

## 2021-06-08 DIAGNOSIS — Z7902 Long term (current) use of antithrombotics/antiplatelets: Secondary | ICD-10-CM

## 2021-06-08 DIAGNOSIS — F1410 Cocaine abuse, uncomplicated: Secondary | ICD-10-CM | POA: Diagnosis present

## 2021-06-08 DIAGNOSIS — Z9114 Patient's other noncompliance with medication regimen: Secondary | ICD-10-CM

## 2021-06-08 DIAGNOSIS — I252 Old myocardial infarction: Secondary | ICD-10-CM

## 2021-06-08 DIAGNOSIS — Z881 Allergy status to other antibiotic agents status: Secondary | ICD-10-CM

## 2021-06-08 DIAGNOSIS — T82855A Stenosis of coronary artery stent, initial encounter: Principal | ICD-10-CM | POA: Diagnosis present

## 2021-06-08 DIAGNOSIS — I5041 Acute combined systolic (congestive) and diastolic (congestive) heart failure: Secondary | ICD-10-CM | POA: Diagnosis present

## 2021-06-08 DIAGNOSIS — Z20822 Contact with and (suspected) exposure to covid-19: Secondary | ICD-10-CM | POA: Diagnosis present

## 2021-06-08 DIAGNOSIS — I2510 Atherosclerotic heart disease of native coronary artery without angina pectoris: Secondary | ICD-10-CM | POA: Diagnosis present

## 2021-06-08 DIAGNOSIS — I248 Other forms of acute ischemic heart disease: Secondary | ICD-10-CM

## 2021-06-08 DIAGNOSIS — Z79899 Other long term (current) drug therapy: Secondary | ICD-10-CM

## 2021-06-08 DIAGNOSIS — I429 Cardiomyopathy, unspecified: Secondary | ICD-10-CM | POA: Diagnosis present

## 2021-06-08 DIAGNOSIS — I249 Acute ischemic heart disease, unspecified: Secondary | ICD-10-CM | POA: Diagnosis present

## 2021-06-08 DIAGNOSIS — I10 Essential (primary) hypertension: Secondary | ICD-10-CM | POA: Diagnosis present

## 2021-06-08 HISTORY — PX: LEFT HEART CATH AND CORONARY ANGIOGRAPHY: CATH118249

## 2021-06-08 LAB — POCT I-STAT, CHEM 8
BUN: 16 mg/dL (ref 6–20)
Calcium, Ion: 1.2 mmol/L (ref 1.15–1.40)
Chloride: 102 mmol/L (ref 98–111)
Creatinine, Ser: 1.1 mg/dL (ref 0.61–1.24)
Glucose, Bld: 131 mg/dL — ABNORMAL HIGH (ref 70–99)
HCT: 43 % (ref 39.0–52.0)
Hemoglobin: 14.6 g/dL (ref 13.0–17.0)
Potassium: 3.6 mmol/L (ref 3.5–5.1)
Sodium: 138 mmol/L (ref 135–145)
TCO2: 23 mmol/L (ref 22–32)

## 2021-06-08 LAB — COMPREHENSIVE METABOLIC PANEL
ALT: 18 U/L (ref 0–44)
AST: 88 U/L — ABNORMAL HIGH (ref 15–41)
Albumin: 4.4 g/dL (ref 3.5–5.0)
Alkaline Phosphatase: 64 U/L (ref 38–126)
Anion gap: 15 (ref 5–15)
BUN: 15 mg/dL (ref 6–20)
CO2: 20 mmol/L — ABNORMAL LOW (ref 22–32)
Calcium: 9.7 mg/dL (ref 8.9–10.3)
Chloride: 101 mmol/L (ref 98–111)
Creatinine, Ser: 1.45 mg/dL — ABNORMAL HIGH (ref 0.61–1.24)
GFR, Estimated: 59 mL/min — ABNORMAL LOW (ref >60)
Glucose, Bld: 124 mg/dL — ABNORMAL HIGH (ref 70–99)
Potassium: 3.9 mmol/L (ref 3.5–5.1)
Sodium: 136 mmol/L (ref 135–145)
Total Bilirubin: 0.7 mg/dL (ref 0.3–1.2)
Total Protein: 9.1 g/dL — ABNORMAL HIGH (ref 6.5–8.1)

## 2021-06-08 LAB — CBC WITH DIFFERENTIAL/PLATELET
Abs Immature Granulocytes: 0.02 10*3/uL (ref 0.00–0.07)
Basophils Absolute: 0 10*3/uL (ref 0.0–0.1)
Basophils Relative: 0 %
Eosinophils Absolute: 0 10*3/uL (ref 0.0–0.5)
Eosinophils Relative: 0 %
HCT: 46.6 % (ref 39.0–52.0)
Hemoglobin: 15.2 g/dL (ref 13.0–17.0)
Immature Granulocytes: 0 %
Lymphocytes Relative: 8 %
Lymphs Abs: 0.8 10*3/uL (ref 0.7–4.0)
MCH: 30.6 pg (ref 26.0–34.0)
MCHC: 32.6 g/dL (ref 30.0–36.0)
MCV: 94 fL (ref 80.0–100.0)
Monocytes Absolute: 0.5 10*3/uL (ref 0.1–1.0)
Monocytes Relative: 5 %
Neutro Abs: 8.2 10*3/uL — ABNORMAL HIGH (ref 1.7–7.7)
Neutrophils Relative %: 87 %
Platelets: 226 10*3/uL (ref 150–400)
RBC: 4.96 MIL/uL (ref 4.22–5.81)
RDW: 12.1 % (ref 11.5–15.5)
WBC: 9.5 10*3/uL (ref 4.0–10.5)
nRBC: 0 % (ref 0.0–0.2)

## 2021-06-08 LAB — RAPID URINE DRUG SCREEN, HOSP PERFORMED
Amphetamines: NOT DETECTED
Barbiturates: NOT DETECTED
Benzodiazepines: NOT DETECTED
Cocaine: POSITIVE — AB
Opiates: NOT DETECTED
Tetrahydrocannabinol: POSITIVE — AB

## 2021-06-08 LAB — ECHOCARDIOGRAM COMPLETE
Area-P 1/2: 5.13 cm2
Height: 66 in
S' Lateral: 3.2 cm
Single Plane A2C EF: 37.9 %
Weight: 2903.02 oz

## 2021-06-08 LAB — MAGNESIUM: Magnesium: 2 mg/dL (ref 1.7–2.4)

## 2021-06-08 LAB — LIPID PANEL
Cholesterol: 167 mg/dL (ref 0–200)
HDL: 38 mg/dL — ABNORMAL LOW (ref >40)
LDL Cholesterol: 111 mg/dL — ABNORMAL HIGH (ref 0–99)
Total CHOL/HDL Ratio: 4.4 RATIO
Triglycerides: 92 mg/dL (ref <150)
VLDL: 18 mg/dL (ref 0–40)

## 2021-06-08 LAB — TROPONIN I (HIGH SENSITIVITY): Troponin I (High Sensitivity): 4552 ng/L (ref <18)

## 2021-06-08 LAB — HEMOGLOBIN A1C
Hgb A1c MFr Bld: 5.4 % (ref 4.8–5.6)
Mean Plasma Glucose: 108.28 mg/dL

## 2021-06-08 LAB — RESP PANEL BY RT-PCR (FLU A&B, COVID) ARPGX2
Influenza A by PCR: NEGATIVE
Influenza B by PCR: NEGATIVE
SARS Coronavirus 2 by RT PCR: NEGATIVE

## 2021-06-08 LAB — APTT: aPTT: 26 seconds (ref 24–36)

## 2021-06-08 LAB — PROTIME-INR
INR: 0.9 (ref 0.8–1.2)
Prothrombin Time: 12.4 seconds (ref 11.4–15.2)

## 2021-06-08 LAB — POTASSIUM: Potassium: 4 mmol/L (ref 3.5–5.1)

## 2021-06-08 LAB — MRSA NEXT GEN BY PCR, NASAL: MRSA by PCR Next Gen: NOT DETECTED

## 2021-06-08 LAB — POCT ACTIVATED CLOTTING TIME: Activated Clotting Time: 426 seconds

## 2021-06-08 SURGERY — LEFT HEART CATH AND CORONARY ANGIOGRAPHY
Anesthesia: LOCAL

## 2021-06-08 MED ORDER — CHLORHEXIDINE GLUCONATE CLOTH 2 % EX PADS
6.0000 | MEDICATED_PAD | Freq: Every day | CUTANEOUS | Status: DC
  Administered 2021-06-08 – 2021-06-10 (×3): 6 via TOPICAL

## 2021-06-08 MED ORDER — FENTANYL CITRATE (PF) 100 MCG/2ML IJ SOLN
INTRAMUSCULAR | Status: DC | PRN
  Administered 2021-06-08: 25 ug via INTRAVENOUS

## 2021-06-08 MED ORDER — VERAPAMIL HCL 2.5 MG/ML IV SOLN
INTRAVENOUS | Status: DC | PRN
  Administered 2021-06-08: 10 mL via INTRA_ARTERIAL

## 2021-06-08 MED ORDER — VERAPAMIL HCL 2.5 MG/ML IV SOLN
INTRAVENOUS | Status: AC
  Filled 2021-06-08: qty 2

## 2021-06-08 MED ORDER — FUROSEMIDE 10 MG/ML IJ SOLN
20.0000 mg | Freq: Two times a day (BID) | INTRAMUSCULAR | Status: DC
  Administered 2021-06-08: 20 mg via INTRAVENOUS
  Filled 2021-06-08: qty 2

## 2021-06-08 MED ORDER — LIDOCAINE HCL (PF) 1 % IJ SOLN
INTRAMUSCULAR | Status: AC
  Filled 2021-06-08: qty 30

## 2021-06-08 MED ORDER — FUROSEMIDE 10 MG/ML IJ SOLN
INTRAMUSCULAR | Status: AC
  Filled 2021-06-08: qty 4

## 2021-06-08 MED ORDER — SODIUM CHLORIDE 0.9 % IV SOLN: INTRAVENOUS | Status: DC

## 2021-06-08 MED ORDER — LIDOCAINE HCL (PF) 1 % IJ SOLN
INTRAMUSCULAR | Status: DC | PRN
  Administered 2021-06-08: 2 mL

## 2021-06-08 MED ORDER — MIDAZOLAM HCL 2 MG/2ML IJ SOLN
INTRAMUSCULAR | Status: DC | PRN
  Administered 2021-06-08: 1 mg via INTRAVENOUS

## 2021-06-08 MED ORDER — PERFLUTREN LIPID MICROSPHERE
1.0000 mL | INTRAVENOUS | Status: AC | PRN
  Administered 2021-06-08: 4 mL via INTRAVENOUS
  Filled 2021-06-08: qty 10

## 2021-06-08 MED ORDER — SODIUM CHLORIDE 0.9 % IV SOLN: 250.0000 mL | INTRAVENOUS | Status: DC | PRN

## 2021-06-08 MED ORDER — ROSUVASTATIN CALCIUM 20 MG PO TABS
40.0000 mg | ORAL_TABLET | Freq: Every day | ORAL | Status: DC
  Administered 2021-06-08 – 2021-06-13 (×6): 40 mg via ORAL
  Filled 2021-06-08 (×6): qty 2
  Filled 2021-06-08: qty 1

## 2021-06-08 MED ORDER — HEPARIN SODIUM (PORCINE) 5000 UNIT/ML IJ SOLN
4000.0000 [IU] | Freq: Once | INTRAMUSCULAR | Status: AC
  Administered 2021-06-08: 4000 [IU] via INTRAVENOUS
  Filled 2021-06-08: qty 1

## 2021-06-08 MED ORDER — IOHEXOL 350 MG/ML SOLN
INTRAVENOUS | Status: DC | PRN
  Administered 2021-06-08: 130 mL

## 2021-06-08 MED ORDER — TICAGRELOR 90 MG PO TABS
ORAL_TABLET | ORAL | Status: AC
  Filled 2021-06-08: qty 2

## 2021-06-08 MED ORDER — ACETAMINOPHEN 325 MG PO TABS
650.0000 mg | ORAL_TABLET | ORAL | Status: DC | PRN
  Administered 2021-06-08 – 2021-06-09 (×4): 650 mg via ORAL
  Filled 2021-06-08 (×4): qty 2

## 2021-06-08 MED ORDER — HEPARIN SODIUM (PORCINE) 1000 UNIT/ML IJ SOLN
INTRAMUSCULAR | Status: DC | PRN
  Administered 2021-06-08: 5000 [IU] via INTRAVENOUS

## 2021-06-08 MED ORDER — SODIUM CHLORIDE 0.9% FLUSH
3.0000 mL | Freq: Two times a day (BID) | INTRAVENOUS | Status: DC
  Administered 2021-06-08 – 2021-06-12 (×7): 3 mL via INTRAVENOUS

## 2021-06-08 MED ORDER — HEPARIN SODIUM (PORCINE) 1000 UNIT/ML IJ SOLN
INTRAMUSCULAR | Status: AC
  Filled 2021-06-08: qty 10

## 2021-06-08 MED ORDER — NITROGLYCERIN 0.4 MG SL SUBL: 0.4000 mg | SUBLINGUAL_TABLET | SUBLINGUAL | Status: DC | PRN

## 2021-06-08 MED ORDER — NITROGLYCERIN IN D5W 200-5 MCG/ML-% IV SOLN
0.0000 ug/min | INTRAVENOUS | Status: DC
  Administered 2021-06-08: 5 ug/min via INTRAVENOUS
  Filled 2021-06-08: qty 250

## 2021-06-08 MED ORDER — ASPIRIN 81 MG PO CHEW
324.0000 mg | CHEWABLE_TABLET | Freq: Once | ORAL | Status: AC
  Administered 2021-06-08: 324 mg via ORAL
  Filled 2021-06-08: qty 4

## 2021-06-08 MED ORDER — CLOPIDOGREL BISULFATE 75 MG PO TABS: 75.0000 mg | ORAL_TABLET | Freq: Every day | ORAL | Status: DC

## 2021-06-08 MED ORDER — EMPAGLIFLOZIN 10 MG PO TABS
10.0000 mg | ORAL_TABLET | Freq: Every day | ORAL | Status: DC
  Administered 2021-06-08 – 2021-06-09 (×2): 10 mg via ORAL
  Filled 2021-06-08 (×2): qty 1

## 2021-06-08 MED ORDER — HEPARIN (PORCINE) IN NACL 1000-0.9 UT/500ML-% IV SOLN
INTRAVENOUS | Status: AC
  Filled 2021-06-08: qty 1000

## 2021-06-08 MED ORDER — SACUBITRIL-VALSARTAN 49-51 MG PO TABS
1.0000 | ORAL_TABLET | Freq: Two times a day (BID) | ORAL | Status: DC
  Administered 2021-06-08 – 2021-06-09 (×3): 1 via ORAL
  Filled 2021-06-08 (×4): qty 1

## 2021-06-08 MED ORDER — FUROSEMIDE 10 MG/ML IJ SOLN
INTRAMUSCULAR | Status: DC | PRN
  Administered 2021-06-08: 20 mg via INTRAVENOUS

## 2021-06-08 MED ORDER — FENTANYL CITRATE (PF) 100 MCG/2ML IJ SOLN
INTRAMUSCULAR | Status: AC
  Filled 2021-06-08: qty 2

## 2021-06-08 MED ORDER — HYDRALAZINE HCL 20 MG/ML IJ SOLN
INTRAMUSCULAR | Status: AC
  Administered 2021-06-08: 10 mg via INTRAVENOUS
  Filled 2021-06-08: qty 1

## 2021-06-08 MED ORDER — LABETALOL HCL 5 MG/ML IV SOLN: 10.0000 mg | INTRAVENOUS | Status: AC | PRN

## 2021-06-08 MED ORDER — SODIUM CHLORIDE 0.9% FLUSH: 3.0000 mL | INTRAVENOUS | Status: DC | PRN

## 2021-06-08 MED ORDER — CLOPIDOGREL BISULFATE 75 MG PO TABS
75.0000 mg | ORAL_TABLET | Freq: Every day | ORAL | Status: DC
  Administered 2021-06-08 – 2021-06-13 (×6): 75 mg via ORAL
  Filled 2021-06-08 (×6): qty 1

## 2021-06-08 MED ORDER — MIDAZOLAM HCL 2 MG/2ML IJ SOLN
INTRAMUSCULAR | Status: AC
  Filled 2021-06-08: qty 2

## 2021-06-08 MED ORDER — FUROSEMIDE 10 MG/ML IJ SOLN
40.0000 mg | Freq: Two times a day (BID) | INTRAMUSCULAR | Status: DC
  Administered 2021-06-08 – 2021-06-09 (×2): 40 mg via INTRAVENOUS
  Filled 2021-06-08 (×2): qty 4

## 2021-06-08 MED ORDER — HYDRALAZINE HCL 20 MG/ML IJ SOLN
10.0000 mg | INTRAMUSCULAR | Status: AC | PRN
  Administered 2021-06-08: 10 mg via INTRAVENOUS
  Filled 2021-06-08: qty 1

## 2021-06-08 MED ORDER — NITROGLYCERIN IN D5W 200-5 MCG/ML-% IV SOLN
0.0000 ug/min | INTRAVENOUS | Status: DC
  Administered 2021-06-08: 40 ug/min via INTRAVENOUS

## 2021-06-08 MED ORDER — ONDANSETRON HCL 4 MG/2ML IJ SOLN
4.0000 mg | Freq: Four times a day (QID) | INTRAMUSCULAR | Status: DC | PRN
  Administered 2021-06-08: 4 mg via INTRAVENOUS
  Filled 2021-06-08: qty 2

## 2021-06-08 MED ORDER — METOPROLOL SUCCINATE ER 25 MG PO TB24
25.0000 mg | ORAL_TABLET | Freq: Every day | ORAL | Status: DC
  Administered 2021-06-08: 25 mg via ORAL
  Filled 2021-06-08: qty 1

## 2021-06-08 MED ORDER — HEPARIN (PORCINE) IN NACL 1000-0.9 UT/500ML-% IV SOLN
INTRAVENOUS | Status: DC | PRN
  Administered 2021-06-08 (×2): 500 mL

## 2021-06-08 SURGICAL SUPPLY — 19 items
BALLN SAPPHIRE 3.0X12 (BALLOONS) ×2
BALLOON SAPPHIRE 3.0X12 (BALLOONS) IMPLANT
CATH DIAG 6FR JR4 (CATHETERS) ×1 IMPLANT
CATH GUIDEZILLA II 6F (CATHETERS) IMPLANT
CATH INFINITI 5FR ANG PIGTAIL (CATHETERS) ×1 IMPLANT
CATH LAUNCHER 6FR EBU3.5 (CATHETERS) ×1 IMPLANT
CATHETER GUIDEZILLA II 6F (CATHETERS) ×2
DEVICE RAD COMP TR BAND LRG (VASCULAR PRODUCTS) ×1 IMPLANT
GLIDESHEATH SLEND SS 6F .021 (SHEATH) ×1 IMPLANT
GUIDEWIRE INQWIRE 1.5J.035X260 (WIRE) IMPLANT
GUIDEWIRE VAS SION BLUE 190 (WIRE) ×1 IMPLANT
INQWIRE 1.5J .035X260CM (WIRE) ×2
KIT ENCORE 26 ADVANTAGE (KITS) ×1 IMPLANT
KIT HEART LEFT (KITS) ×2 IMPLANT
PACK CARDIAC CATHETERIZATION (CUSTOM PROCEDURE TRAY) ×2 IMPLANT
SYR MEDRAD MARK 7 150ML (SYRINGE) ×2 IMPLANT
TRANSDUCER W/STOPCOCK (MISCELLANEOUS) ×2 IMPLANT
TUBING CIL FLEX 10 FLL-RA (TUBING) ×2 IMPLANT
TUBING CONTRAST HIGH PRESS 48 (TUBING) ×1 IMPLANT

## 2021-06-08 NOTE — ED Notes (Signed)
Pt transported to the cath lab

## 2021-06-08 NOTE — ED Provider Notes (Signed)
[facility] EMERGENCY DEPARTMENT Provider Note   CSN: [id number] Arrival date & time: 06/08/21  [id number]     History  Chief Complaint  Patient presents with   Code STEMI    Christopher Gibson is a 51 y.o. male.  HPI  Has a history of coronary artery disease status post stent to his LAD back in June the presents to the emergency room today with chest pain for a few hours.  Sounds like the pain started around 0 130 was associated with nausea vomiting.  No real shortness of breath but does have some sweating.  Maybe a little lightheadedness.  Presents here for further evaluation.  Denies any drug or alcohol use at this time (for at least the last 2 months).  States he has been off his Plavix for for 5 days.  In a note recently in December states he was off it for a month at that time so sounds like he is not consistently compliant.    Home Medications Prior to Admission medications   Medication Sig Start Date End Date Taking? Authorizing Provider  aspirin 81 MG EC tablet Take 1 tablet (81 mg total) by mouth daily. 05/03/21  Yes Truddie Hidden, MD  carvedilol (COREG) 6.25 MG tablet Take 1 tablet (6.25 mg total) by mouth 2 (two) times daily with a meal. 05/03/21  Yes Truddie Hidden, MD  clopidogrel (PLAVIX) 75 MG tablet Take 1 tablet (75 mg total) by mouth daily. 05/03/21  Yes Truddie Hidden, MD  nitroGLYCERIN (NITROSTAT) 0.4 MG SL tablet Place 1 tablet (0.4 mg total) under the tongue every 5 (five) minutes as needed for chest pain (CP or SOB). 10/15/20  Yes Bhagat, Bhavinkumar, PA  rosuvastatin (CRESTOR) 40 MG tablet Take 1 tablet (40 mg total) by mouth daily. 05/03/21  Yes Truddie Hidden, MD  sacubitril-valsartan (ENTRESTO) 49-51 MG Take 1 tablet by mouth 2 (two) times daily. 05/03/21  Yes Truddie Hidden, MD  chlorproMAZINE (THORAZINE) 50 MG tablet Take 1 tablet (50 mg total) by mouth 3 (three) times daily as needed for hiccoughs. Patient not taking: Reported  on 06/08/2021 03/19/21   Patrecia Pour, MD      Allergies    Keflex [cephalexin]    Review of Systems   Review of Systems  Physical Exam Updated Vital Signs BP 108/74 (BP Location: Right Arm)    Pulse 83    Temp 98.3 F (36.8 C) (Oral)    Resp 15    Ht 5\' 6"  (1.676 m)    Wt 82.6 kg    SpO2 98%    BMI 29.41 kg/m  Physical Exam Vitals and nursing note reviewed.  Constitutional:      Appearance: He is well-developed.  HENT:     Head: Normocephalic and atraumatic.     Mouth/Throat:     Mouth: Mucous membranes are moist.     Pharynx: Oropharynx is clear.  Eyes:     Pupils: Pupils are equal, round, and reactive to light.  Cardiovascular:     Rate and Rhythm: Normal rate.  Pulmonary:     Effort: Pulmonary effort is normal. No respiratory distress.  Abdominal:     General: There is no distension.  Musculoskeletal:        General: Normal range of motion.     Cervical back: Normal range of motion.  Skin:    General: Skin is warm.  Neurological:     General: No focal deficit present.  Mental Status: He is alert.    ED Results / Procedures / Treatments   Labs (all labs ordered are listed, but only abnormal results are displayed) Labs Reviewed  COMPREHENSIVE METABOLIC PANEL - Abnormal; Notable for the following components:      Result Value   CO2 20 (*)    Glucose, Bld 124 (*)    Creatinine, Ser 1.45 (*)    Total Protein 9.1 (*)    AST 88 (*)    GFR, Estimated 59 (*)    All other components within normal limits  CBC WITH DIFFERENTIAL/PLATELET - Abnormal; Notable for the following components:   Neutro Abs 8.2 (*)    All other components within normal limits  LIPID PANEL - Abnormal; Notable for the following components:   HDL 38 (*)    LDL Cholesterol 111 (*)    All other components within normal limits  RAPID URINE DRUG SCREEN, HOSP PERFORMED - Abnormal; Notable for the following components:   Cocaine POSITIVE (*)    Tetrahydrocannabinol POSITIVE (*)    All other  components within normal limits  BASIC METABOLIC PANEL - Abnormal; Notable for the following components:   Sodium 132 (*)    Chloride 96 (*)    BUN 26 (*)    Creatinine, Ser 1.65 (*)    GFR, Estimated 51 (*)    All other components within normal limits  BASIC METABOLIC PANEL - Abnormal; Notable for the following components:   Sodium 134 (*)    BUN 23 (*)    All other components within normal limits  POCT I-STAT, CHEM 8 - Abnormal; Notable for the following components:   Glucose, Bld 131 (*)    All other components within normal limits  TROPONIN I (HIGH SENSITIVITY) - Abnormal; Notable for the following components:   Troponin I (High Sensitivity) 4,552 (*)    All other components within normal limits  RESP PANEL BY RT-PCR (FLU A&B, COVID) ARPGX2  MRSA NEXT GEN BY PCR, NASAL  HEMOGLOBIN A1C  PROTIME-INR  APTT  POTASSIUM  MAGNESIUM  CBC  POCT ACTIVATED CLOTTING TIME    EKG EKG Interpretation  Date/Time:  Wednesday June 08 2021 05:22:31 EST Ventricular Rate:  84 PR Interval:  149 QRS Duration: 96 QT Interval:  351 QTC Calculation: 415 R Axis:   249 Text Interpretation: Sinus rhythm Inferior infarct, acute (LCx) Probable anteroseptal infarct, recent >>> Acute MI <<< Confirmed by Nanda Quinton [phone]) on 06/09/2021 3:44:44 PM  Radiology No results found.  Procedures .Critical Care Performed by: Merrily Pew, MD Authorized by: Merrily Pew, MD   Critical care provider statement:    Critical care time (minutes):  32   Critical care time was exclusive of:  Separately billable procedures and treating other patients and teaching time   Critical care was necessary to treat or prevent imminent or life-threatening deterioration of the following conditions:  Cardiac failure   Critical care was time spent personally by me on the following activities:  Development of treatment plan with patient or surrogate, evaluation of patient's response to treatment, examination of patient,  obtaining history from patient or surrogate, review of old charts, re-evaluation of patient's condition, pulse oximetry, ordering and performing treatments and interventions and ordering and review of laboratory studies    Medications Ordered in ED Medications  rosuvastatin (CRESTOR) tablet 40 mg (40 mg Oral Given 06/11/21 0944)  nitroGLYCERIN (NITROSTAT) SL tablet 0.4 mg (has no administration in time range)  labetalol (NORMODYNE) injection 10 mg (has no  administration in time range)  hydrALAZINE (APRESOLINE) injection 10 mg (10 mg Intravenous Given 06/08/21 1610)  acetaminophen (TYLENOL) tablet 650 mg (650 mg Oral Given 06/09/21 1138)  ondansetron (ZOFRAN) injection 4 mg (4 mg Intravenous Given 06/08/21 0911)  sodium chloride flush (NS) 0.9 % injection 3 mL (0 mLs Intravenous Duplicate [id number])  sodium chloride flush (NS) 0.9 % injection 3 mL (has no administration in time range)  0.9 %  sodium chloride infusion (has no administration in time range)  clopidogrel (PLAVIX) tablet 75 mg (75 mg Oral Given 06/11/21 0754)  perflutren lipid microspheres (DEFINITY) IV suspension (4 mLs Intravenous Given 06/08/21 0944)  0.9 %  sodium chloride infusion (250 mLs Intravenous New Bag/Given 06/09/21 1646)  empagliflozin (JARDIANCE) tablet 10 mg (10 mg Oral Given 06/11/21 0944)  aspirin EC tablet 81 mg (81 mg Oral Given 06/11/21 0944)  sacubitril-valsartan (ENTRESTO) 24-26 mg per tablet (1 tablet Oral Given 06/11/21 2135)  aspirin chewable tablet 324 mg (324 mg Oral Given 06/08/21 0532)  heparin injection 4,000 Units (4,000 Units Intravenous Given 06/08/21 0530)  potassium chloride SA (KLOR-CON M) CR tablet 40 mEq (40 mEq Oral Given 06/10/21 [id number])    ED Course/ Medical Decision Making/ A&P                           Medical Decision Making Amount and/or Complexity of Data Reviewed Radiology: ordered.   EKG is significantly different from his most recent in December concerning for an acute occlusion. STEMI activated  immediately after comparing ECG with a couple old ones.    Discussed with Dr. Ali Lowe with [facility] who recommends heparin, aspirin and blood pressure control and they will come to see the patient for further management.  Discussed with Dr. Kalman Shan with Cardiology (fellow) as well who is down to see the patient.   Patietn to go to the cath lab. Still somewhat hypertensive. otherwise no acute changes. ECG slightly stable or possibly slightly improved.    Final Clinical Impression(s) / ED Diagnoses Final diagnoses:  Acute ST elevation myocardial infarction (STEMI) due to occlusion of distal portion of left anterior descending (LAD) coronary artery [facility])    Rx / DC Orders ED Discharge Orders          Ordered    Amb Referral to Cardiac Rehabilitation        06/11/21 1220              Maegan Buller, Corene Cornea, MD 06/12/21 [phone]

## 2021-06-08 NOTE — Progress Notes (Signed)
°  Echocardiogram 2D Echocardiogram has been performed.  Christopher Gibson 06/08/2021, 10:11 AM

## 2021-06-08 NOTE — H&P (Signed)
Cardiology Admission History and Physical:   Patient ID: Christopher Gibson MRN: [id number]; DOB: 1970-10-29   Admission date: 06/08/2021  PCP:  Merryl Hacker No   [facility] Providers Cardiologist:  Sinclair Grooms, MD        Chief Complaint: Chest pain  Patient Profile:   Christopher Gibson is a 51 y.o. male with a history of ST elevation myocardial infarction with PCI of the mid LAD in June 2022, cocaine abuse, hypertension, and hyperlipidemia who is being seen 06/08/2021 for the evaluation of chest pain.  History of Present Illness:   Christopher Gibson presents after 5 hours of chest pain.  The patient tells me he has been out of his medications including his dual antiplatelet therapy for approximately 4 days.  He tells me he developed acute onset 8 out of 10 chest pain.  This is reminiscent of his prior ST elevation myocardial infarction symptoms.  However it is different in the fact that he is not short of breath.  He had a little bit of diaphoresis but no nausea.  Because of the pain he presented to the emergency department a few hours later.  His blood pressure was elevated.  EKG demonstrated sinus rhythm with anterior ST elevations with associated Q waves.  On my evaluation the patient is comfortable and not in cardiogenic shock.  His laboratories are pending.  He still endorses 8 out of 10 pain.  He tells me he is taken no illicits recently.  He has not followed with cardiology recently.   Past Medical History:  Diagnosis Date   CAD (coronary artery disease)    Chronic combined systolic and diastolic CHF (congestive heart failure) (HCC)    Cocaine abuse (HCC)    Heroin abuse ([place])    HTN (hypertension)    S/P angioplasty with stent 10/13/20 DES to mLAD    STEMI (ST elevation myocardial infarction) ([place]) 10/13/2020    Past Surgical History:  Procedure Laterality Date   CORONARY/GRAFT ACUTE MI REVASCULARIZATION N/A 10/13/2020   Procedure: Coronary/Graft Acute MI Revascularization;  Surgeon:  Belva Crome, MD;  Location: [place] [facility];  Service: Cardiovascular;  Laterality: N/A;   LEFT HEART CATH AND CORONARY ANGIOGRAPHY N/A 10/13/2020   Procedure: LEFT HEART CATH AND CORONARY ANGIOGRAPHY;  Surgeon: Belva Crome, MD;  Location: [place] [facility];  Service: Cardiovascular;  Laterality: N/A;     Medications Prior to Admission: Prior to Admission medications   Medication Sig Start Date End Date Taking? Authorizing Provider  aspirin 81 MG EC tablet Take 1 tablet (81 mg total) by mouth daily. 05/03/21   Truddie Hidden, MD  carvedilol (COREG) 6.25 MG tablet Take 1 tablet (6.25 mg total) by mouth 2 (two) times daily with a meal. 05/03/21   Truddie Hidden, MD  chlorproMAZINE (THORAZINE) 50 MG tablet Take 1 tablet (50 mg total) by mouth 3 (three) times daily as needed for hiccoughs. 03/19/21   Patrecia Pour, MD  clopidogrel (PLAVIX) 75 MG tablet Take 1 tablet (75 mg total) by mouth daily. 05/03/21   Truddie Hidden, MD  naproxen (NAPROSYN) 500 MG tablet Take 1 tablet (500 mg total) by mouth 2 (two) times daily. 05/03/21   Truddie Hidden, MD  nitroGLYCERIN (NITROSTAT) 0.4 MG SL tablet Place 1 tablet (0.4 mg total) under the tongue every 5 (five) minutes as needed for chest pain (CP or SOB). 10/15/20   Bhagat, Crista Luria, PA  rosuvastatin (CRESTOR) 40 MG tablet Take 1  tablet (40 mg total) by mouth daily. 05/03/21   Truddie Hidden, MD  sacubitril-valsartan (ENTRESTO) 49-51 MG Take 1 tablet by mouth 2 (two) times daily. 05/03/21   Truddie Hidden, MD     Allergies:    Allergies  Allergen Reactions   Keflex [Cephalexin] Other (See Comments)    "Locks my body up"    Social History:   Social History   Socioeconomic History   Marital status: Single    Spouse name: Not on file   Number of children: Not on file   Years of education: Not on file   Highest education level: Not on file  Occupational History   Not on file  Tobacco Use   Smoking status: Every  Day   Smokeless tobacco: Never  Substance and Sexual Activity   Alcohol use: Not Currently   Drug use: Yes    Types: Cocaine, Heroin   Sexual activity: Not on file  Other Topics Concern   Not on file  Social History Narrative   Not on file   Social Determinants of Health   Financial Resource Strain: High Risk   Difficulty of Paying Living Expenses: Hard  Food Insecurity: Food Insecurity Present   Worried About Running Out of Food in the Last Year: Sometimes true   Ran Out of Food in the Last Year: Sometimes true  Transportation Needs: No Transportation Needs   Lack of Transportation (Medical): No   Lack of Transportation (Non-Medical): No  Physical Activity: Not on file  Stress: Not on file  Social Connections: Not on file  Intimate Partner Violence: Not on file    Family History:   The patient's family history is negative for Coronary artery disease.    ROS:  Please see the history of present illness.  All other ROS reviewed and negative.     Physical Exam/Data:   Vitals:   06/08/21 0633 06/08/21 0638 06/08/21 0642 06/08/21 0647  BP: (!) 151/91 (!) 155/97 (!) 155/93 (!) 155/93  Pulse: 81 86 69 81  Resp: 16 18 11 17   Temp:      TempSrc:      SpO2: 99% 99% 99% 100%  Weight:      Height:       No intake or output data in the 24 hours ending 06/08/21 0704 Last 3 Weights 06/08/2021 05/02/2021 03/18/2021  Weight (lbs) 170 lb 176 lb 162 lb 11.2 oz  Weight (kg) 77.111 kg 79.833 kg 73.8 kg     Body mass index is 27.44 kg/m.  General:  Well nourished, well developed, in no acute distress HEENT: normal Neck: no JVD Vascular: No carotid bruits; Distal pulses 2+ bilaterally   Cardiac:  normal S1, S2; RRR; no murmur  Lungs:  clear to auscultation bilaterally, no wheezing, rhonchi or rales  Abd: soft, nontender, no hepatomegaly  Ext: no edema Musculoskeletal:  No deformities, BUE and BLE strength normal and equal Skin: warm and dry  Neuro:  CNs 2-12 intact, no focal  abnormalities noted Psych:  Normal affect    EKG:   Sinus rhythm with ST elevations anterior and inferiorly with associated Q waves  Relevant CV Studies: TTE 11/22  1. LVEF 45-50%; the basal segments are moving well. Mid segments are  hypokinetic. The apex is akinetic. The left ventricle demonstrates  regional wall motion abnormalities (see scoring diagram/findings for  description). There is mild left ventricular  hypertrophy. Left ventricular diastolic parameters are consistent with  Grade II diastolic dysfunction (pseudonormalization).  2. Right ventricular systolic function is normal. The right ventricular  size is normal. Tricuspid regurgitation signal is inadequate for assessing  PA pressure.   3. The mitral valve is normal in structure. Trivial mitral valve  regurgitation.   4. The aortic valve is normal in structure. Aortic valve regurgitation is  not visualized.   5. Aortic small aortic root aneurysm.   6. The inferior vena cava is normal in size with greater than 50%  respiratory variability, suggesting right atrial pressure of 3 mmHg.   Cath 6/22 Late presenting anterior infarction due to occlusion of the mid LAD. Successful stenting of the mid LAD using an 18 x 2.5 Onyx postdilated to 3.5 mm in diameter with TIMI grade III flow. Widely patent left main Diffuse luminal irregularities in the proximal and mid LAD Diffuse mild to moderate nonobstructive atherosclerosis in the circumflex. 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  EF 30 to 35%.  LVEDP 24 mmHg. Acute systolic heart failure based upon hemodynamics and wall motion.  Laboratory Data:  High Sensitivity Troponin:   Recent Labs  Lab 06/08/21 0512  TROPONINIHS 4,552*      Chemistry Recent Labs  Lab 06/08/21 0512 06/08/21 0624  NA 136 138  K 3.9 3.6  CL 101 102  CO2 20*  --   GLUCOSE 124* 131*  BUN  15 16  CREATININE 1.45* 1.10  CALCIUM 9.7  --   GFRNONAA 59*  --   ANIONGAP 15  --     Recent Labs  Lab 06/08/21 0512  PROT 9.1*  ALBUMIN 4.4  AST 88*  ALT 18  ALKPHOS 64  BILITOT 0.7   Lipids  Recent Labs  Lab 06/08/21 0514  CHOL 167  TRIG 92  HDL 38*  LDLCALC 111*  CHOLHDL 4.4   Hematology Recent Labs  Lab 06/08/21 0512 06/08/21 0624  WBC 9.5  --   RBC 4.96  --   HGB 15.2 14.6  HCT 46.6 43.0  MCV 94.0  --   MCH 30.6  --   MCHC 32.6  --   RDW 12.1  --   PLT 226  --    Thyroid No results for input(s): TSH, FREET4 in the last 168 hours. BNPNo results for input(s): BNP, PROBNP in the last 168 hours.  DDimer No results for input(s): DDIMER in the last 168 hours.   Radiology/Studies:  DG Chest Portable 1 View  Result Date: 06/08/2021 CLINICAL DATA:  Chest pain EXAM: PORTABLE CHEST 1 VIEW COMPARISON:  05/02/2021 FINDINGS: The heart size and mediastinal contours are within normal limits. Both lungs are clear. The visualized skeletal structures are unremarkable. IMPRESSION: No active disease. Electronically Signed   By: Kathreen Devoid M.D.   On: 06/08/2021 06:16     Assessment and Plan:   Acute coronary syndrome with ST elevations: The patient is having ongoing chest pain.  He has been off antiplatelets for a few days.  It is unclear whether his EKG changes represent an acute thrombotic event or possibly aneurysm formation.  Given this diagnostic dilemma we will refer the patient for emergency coronary angiography.  Further recommendations will follow the results of this testing.  2.   Hyperlipidemia we will plan on administering high-dose atorvastatin  3.   History of substance abuse and noncompliance: We will follow up on his UDS.  4.  Cardiomyopathy: We will plan on obtaining echocardiogram and placing the patient on goal-directed  medical therapy.    Risk Assessment/Risk Scores:    TIMI Risk Score for ST  Elevation MI:   The patient's TIMI risk score is 3,  which indicates a 4.4% risk of all cause mortality at 30 days.        Severity of Illness: The appropriate patient status for this patient is INPATIENT. Inpatient status is judged to be reasonable and necessary in order to provide the required intensity of service to ensure the patient's safety. The patient's presenting symptoms, physical exam findings, and initial radiographic and laboratory data in the context of their chronic comorbidities is felt to place them at high risk for further clinical deterioration. Furthermore, it is not anticipated that the patient will be medically stable for discharge from the hospital within 2 midnights of admission.   * I certify that at the point of admission it is my clinical judgment that the patient will require inpatient hospital care spanning beyond 2 midnights from the point of admission due to high intensity of service, high risk for further deterioration and high frequency of surveillance required.*   For questions or updates, please contact [place] Please consult [email or web address] for contact info under     Signed, Early Osmond, MD  06/08/2021 7:04 AM

## 2021-06-08 NOTE — TOC Initial Note (Addendum)
Transition of Care [facility]) - Initial/Assessment Note    Patient Details  Name: Christopher Gibson MRN: [id number] Date of Birth: [birth year]  Transition of [facility] CM/SW Contact:    Lawerance Sabal, RN Phone Number: 06/08/2021, 1:41 PM  Clinical Narrative:           Sherron Monday w patient at bedside. He states that he has disability, medicare and medicaid. He has several encounters in chart review and continues to show no insurance He claims that he had [place] a few years ago when he lived there, it seems as though he never transferred it to [facility]. Records show primary care established [facility]/18/20 at [facility]. Requested CMA to schedule follow up appointment. MATCH reinstated for 2/1 to 06/16/21, please send meds through [facility] [place].  Please consider lack of insurance coverage and poor compliance with follow up care as they will provide to be barriers with high cost prescriptions after discahrge.          Expected Discharge Plan: Home/Self Care Barriers to Discharge: Continued Medical Work up   Patient Goals and CMS Choice Patient states their goals for this hospitalization and ongoing recovery are:: return home   Choice offered to / list presented to : NA  Expected Discharge Plan and Services Expected Discharge Plan: Home/Self Care   Discharge Planning Services: CM Consult, MATCH Program, Medication Assistance, Follow-up appt scheduled   Living arrangements for the past 2 months: Single Family Home                                      Prior Living Arrangements/Services Living arrangements for the past 2 months: Single Family Home Lives with:: Siblings (sister)                   Activities of Daily Living Home Assistive Devices/Equipment: None ADL Screening (condition at time of admission) Patient's cognitive ability adequate to safely complete daily activities?: Yes Is the patient deaf or have difficulty hearing?: No Does the patient have  difficulty seeing, even when wearing glasses/contacts?: No Does the patient have difficulty concentrating, remembering, or making decisions?: No Patient able to express need for assistance with ADLs?: No Does the patient have difficulty dressing or bathing?: No Independently performs ADLs?: Yes (appropriate for developmental age) Does the patient have difficulty walking or climbing stairs?: No Weakness of Legs: None Weakness of Arms/Hands: None  Permission Sought/Granted                  Emotional Assessment              Admission diagnosis:  ACS (acute coronary syndrome) [facility]) [I24.9] Patient Active Problem List   Diagnosis Date Noted   ACS (acute coronary syndrome) (HCC) 06/08/2021   Hypertensive urgency 03/18/2021   CAD (coronary artery disease), native coronary artery 03/17/2021   Chest pain 03/17/2021   Cocaine abuse (HCC) 03/17/2021   Chronic combined systolic and diastolic CHF (congestive heart failure) (HCC) 03/17/2021   Heroin abuse (HCC) 03/17/2021   HTN (hypertension) 03/17/2021   Acute ST elevation myocardial infarction (STEMI) due to occlusion of distal portion of left anterior descending (LAD) coronary artery (HCC) 10/13/2020   ST elevation myocardial infarction (STEMI) (HCC)    Coronary artery disease involving native coronary artery of native heart with unstable angina pectoris (HCC)    Acute combined systolic and diastolic HF (heart failure), NYHA class  3 (HCC)    PCP:  Pcp, No Pharmacy:   [facility] [id number] [place] [address] Phone: [phone] Fax: [phone]     Social Determinants of Health (SDOH) Interventions    Readmission Risk Interventions No flowsheet data found.

## 2021-06-08 NOTE — ED Provider Triage Note (Signed)
Emergency Medicine Provider Triage Evaluation Note  Christopher Gibson , a 51 y.o. male  was evaluated in triage.  Patient has a history of cocaine and heroin abuse but states that he has not used drugs for about 2 months.  Patient has STEMI with an LAD stent in June 2022.  Pt complains of chest pain.  States he woke up with the symptoms around 1:30 AM.  States it is central.  No shortness of breath.  Does note an episode of watery vomitus.  Physical Exam  BP (!) 171/107 (BP Location: Right Arm)    Pulse 79    Temp 98.6 F (37 C) (Oral)    Resp 16    SpO2 98%  Gen:   Awake, no distress   Resp:  Normal effort  MSK:   Moves extremities without difficulty  Other:    Medical Decision Making  Medically screening exam initiated at 5:11 AM.  Appropriate orders placed.  NOLIN GRELL was informed that the remainder of the evaluation will be completed by another provider, this initial triage assessment does not replace that evaluation, and the importance of remaining in the ED until their evaluation is complete.  Abnormal ECG.  Concerning for STEMI.  Code STEMI has been initiated.  Charge nurse made aware.   Placido Sou, PA-C 06/08/21 [phone]

## 2021-06-08 NOTE — ED Triage Notes (Signed)
Pt here from home with c/o chest pain aand some slight n/v and sob that started aboit 130 this morning , pt has one stent and has missed that last 4 days of his plavix.,

## 2021-06-09 DIAGNOSIS — F1410 Cocaine abuse, uncomplicated: Secondary | ICD-10-CM

## 2021-06-09 DIAGNOSIS — I2102 ST elevation (STEMI) myocardial infarction involving left anterior descending coronary artery: Secondary | ICD-10-CM

## 2021-06-09 MED ORDER — SODIUM CHLORIDE 0.9 % IV SOLN
INTRAVENOUS | Status: DC
  Administered 2021-06-09: 250 mL via INTRAVENOUS

## 2021-06-09 MED ORDER — FUROSEMIDE 40 MG PO TABS: 40.0000 mg | ORAL_TABLET | Freq: Every day | ORAL | Status: DC

## 2021-06-09 MED ORDER — CARVEDILOL 6.25 MG PO TABS
6.2500 mg | ORAL_TABLET | Freq: Two times a day (BID) | ORAL | Status: DC
  Administered 2021-06-09: 6.25 mg via ORAL
  Filled 2021-06-09: qty 1

## 2021-06-09 MED ORDER — SPIRONOLACTONE 12.5 MG HALF TABLET
12.5000 mg | ORAL_TABLET | Freq: Every day | ORAL | Status: DC
  Administered 2021-06-09: 12.5 mg via ORAL
  Filled 2021-06-09: qty 1

## 2021-06-09 NOTE — Progress Notes (Signed)
Progress Note  Patient Name: Christopher Gibson Date of Encounter: 06/09/2021  [facility] Cardiologist: Sinclair Grooms, MD   Subjective   Feels much better today. No dyspnea. Chest pain resolved.   Inpatient Medications    Scheduled Meds:  Chlorhexidine Gluconate Cloth  6 each Topical Daily   clopidogrel  75 mg Oral Q breakfast   empagliflozin  10 mg Oral Daily   metoprolol succinate  25 mg Oral QHS   rosuvastatin  40 mg Oral Daily   sacubitril-valsartan  1 tablet Oral BID   sodium chloride flush  3 mL Intravenous Q12H   Continuous Infusions:  sodium chloride     nitroGLYCERIN Stopped (06/08/21 1342)   PRN Meds: sodium chloride, acetaminophen, nitroGLYCERIN, ondansetron (ZOFRAN) IV, sodium chloride flush   Vital Signs    Vitals:   06/09/21 0400 06/09/21 0500 06/09/21 0600 06/09/21 0700  BP: 102/81 100/78 99/65 105/82  Pulse: 92 89 88 (!) 108  Resp: (!) 21 14 (!) 23 18  Temp: 99.5 F (37.5 C)     TempSrc: Oral     SpO2: 97% 98% 98% 98%  Weight:      Height:        Intake/Output Summary (Last 24 hours) at 06/09/2021 0756 Last data filed at 06/09/2021 0600 Gross per 24 hour  Intake 1512.74 ml  Output 1925 ml  Net -412.26 ml   Last 3 Weights 06/08/2021 06/08/2021 05/02/2021  Weight (lbs) 181 lb 7 oz 170 lb 176 lb  Weight (kg) 82.3 kg 77.111 kg 79.833 kg      Telemetry    NSR - Personally Reviewed  ECG    None today. Yesterday with anterior infarct - Personally Reviewed  Physical Exam   GEN: No acute distress.   Neck: No JVD Cardiac: RRR, no murmurs, rubs, or gallops.  Respiratory: Clear to auscultation bilaterally. GI: Soft, nontender, non-distended  MS: No edema; No deformity. Neuro:  Nonfocal  Psych: Normal affect   Labs    High Sensitivity Troponin:   Recent Labs  Lab 06/08/21 0512  TROPONINIHS 4,552*     Chemistry Recent Labs  Lab 06/08/21 0512 06/08/21 0624 06/08/21 1800  NA 136 138  --   K 3.9 3.6 4.0  CL 101 102  --   CO2  20*  --   --   GLUCOSE 124* 131*  --   BUN 15 16  --   CREATININE 1.45* 1.10  --   CALCIUM 9.7  --   --   MG  --   --  2.0  PROT 9.1*  --   --   ALBUMIN 4.4  --   --   AST 88*  --   --   ALT 18  --   --   ALKPHOS 64  --   --   BILITOT 0.7  --   --   GFRNONAA 59*  --   --   ANIONGAP 15  --   --     Lipids  Recent Labs  Lab 06/08/21 0514  CHOL 167  TRIG 92  HDL 38*  LDLCALC 111*  CHOLHDL 4.4    Hematology Recent Labs  Lab 06/08/21 0512 06/08/21 0624  WBC 9.5  --   RBC 4.96  --   HGB 15.2 14.6  HCT 46.6 43.0  MCV 94.0  --   MCH 30.6  --   MCHC 32.6  --   RDW 12.1  --   PLT 226  --  Thyroid No results for input(s): TSH, FREET4 in the last 168 hours.  BNPNo results for input(s): BNP, PROBNP in the last 168 hours.  DDimer No results for input(s): DDIMER in the last 168 hours.   Radiology    CARDIAC CATHETERIZATION  Addendum Date: 06/08/2021     Mid LAD lesion is 100% stenosed.   Prox RCA to Dist RCA lesion is 50% stenosed.   1st Mrg lesion is 60% stenosed.   LV end diastolic pressure is severely elevated.   The left ventricular ejection fraction is 25-35% by visual estimate. 1 chronically occluded mid LAD stent with mild to moderate disease elsewhere. 2.  Severely elevated LVEDP and this is likely the reason for the patient's elevated troponin.  20 mg of Lasix IV was administered in the cardiac catheterization laboratory and will be continued.  Goal-directed medical therapy will be pursued. 3.  Severe left ventricular dysfunction with ejection fraction approximately 25 to 30%. Recommendations: Goal-directed medical therapy for acute on chronic systolic heart failure.  Addendum Date: 06/08/2021     Mid LAD lesion is 100% stenosed.   Prox RCA to Dist RCA lesion is 50% stenosed.   1st Mrg lesion is 60% stenosed.   LV end diastolic pressure is severely elevated.   The left ventricular ejection fraction is 25-35% by visual estimate. 1 chronically occluded mid LAD stent with  mild to moderate disease elsewhere. 2.  Severely elevated LVEDP and this is likely the reason for the patient's elevated troponin.  20 mg of Lasix IV was administered in the cardiac catheterization laboratory and will be continued.  Goal-directed medical therapy will be pursued. 3.  Severe left ventricular dysfunction with ejection fraction approximately 25 to 30%. Recommendations: Goal-directed medical therapy for acute on chronic systolic heart failure.  Result Date: 06/08/2021   Mid LAD lesion is 100% stenosed.   Prox RCA to Dist RCA lesion is 50% stenosed.   1st Mrg lesion is 60% stenosed.   LV end diastolic pressure is severely elevated.   The left ventricular ejection fraction is 25-35% by visual estimate. 1 chronically occluded mid LAD stent with mild to moderate disease elsewhere. 2.  Severely elevated LVEDP and this is likely the reason for the patient's elevated troponin.  20 mg of Lasix IV was administered in the cardiac catheterization laboratory and will be continued.  Goal-directed medical therapy will be pursued. 3.  Severe left ventricular dysfunction with ejection fraction approximately 25 to 30%. Recommendations: Goal-directed medical therapy for acute on chronic systolic heart failure.   DG Chest Portable 1 View  Result Date: 06/08/2021 CLINICAL DATA:  Chest pain EXAM: PORTABLE CHEST 1 VIEW COMPARISON:  05/02/2021 FINDINGS: The heart size and mediastinal contours are within normal limits. Both lungs are clear. The visualized skeletal structures are unremarkable. IMPRESSION: No active disease. Electronically Signed   By: Elige Ko M.D.   On: 06/08/2021 06:16   ECHOCARDIOGRAM COMPLETE  Result Date: 06/08/2021    ECHOCARDIOGRAM REPORT   Patient Name:   Christopher Gibson Date of Exam: 06/08/2021 Medical Rec #:  [id number]       Height:       66.0 in Accession #:    [id number]      Weight:       181.4 lb Date of Birth:  08/04/1970       BSA:          1.919 m Patient Age:    50 years        BP:  132/83 mmHg Patient Gender: M               HR:           77 bpm. Exam Location:  Inpatient Procedure: 2D Echo Indications:    acute myocardial infarction  History:        Patient has prior history of Echocardiogram examinations, most                 recent 03/18/2021. Signs/Symptoms:Chest Pain; Risk                 Factors:Hypertension and Dyslipidemia.  Sonographer:    Johny Chess RDCS Referring Phys: [id number] [place]  1. Left ventricular ejection fraction, by estimation, is 35 to 40%. The left ventricle has moderately decreased function. The left ventricle demonstrates regional wall motion abnormalities (see scoring diagram/findings for description). There is mild concentric left ventricular hypertrophy. Left ventricular diastolic parameters are consistent with Grade I diastolic dysfunction (impaired relaxation).  2. Right ventricular systolic function is normal. The right ventricular size is normal.  3. The mitral valve is normal in structure. No evidence of mitral valve regurgitation. No evidence of mitral stenosis.  4. The aortic valve is normal in structure. Aortic valve regurgitation is not visualized. No aortic stenosis is present.  5. The inferior vena cava is normal in size with greater than 50% respiratory variability, suggesting right atrial pressure of 3 mmHg. FINDINGS  Left Ventricle: Left ventricular ejection fraction, by estimation, is 35 to 40%. The left ventricle has moderately decreased function. The left ventricle demonstrates regional wall motion abnormalities. Definity contrast agent was given IV to delineate the left ventricular endocardial borders. The left ventricular internal cavity size was normal in size. There is mild concentric left ventricular hypertrophy. Left ventricular diastolic parameters are consistent with Grade I diastolic dysfunction (impaired relaxation).  LV Wall Scoring: The mid and distal anterior wall, anterior septum, apical inferior  segment, basal inferoseptal segment, and apex are akinetic. The antero-lateral wall, inferior wall, basal inferolateral segment, apical lateral segment, mid inferoseptal segment, apical septal segment, and basal anterior segment are hypokinetic. Right Ventricle: The right ventricular size is normal. No increase in right ventricular wall thickness. Right ventricular systolic function is normal. Left Atrium: Left atrial size was normal in size. Right Atrium: Right atrial size was normal in size. Pericardium: There is no evidence of pericardial effusion. Mitral Valve: The mitral valve is normal in structure. No evidence of mitral valve regurgitation. No evidence of mitral valve stenosis. Tricuspid Valve: The tricuspid valve is normal in structure. Tricuspid valve regurgitation is not demonstrated. No evidence of tricuspid stenosis. Aortic Valve: The aortic valve is normal in structure. Aortic valve regurgitation is not visualized. No aortic stenosis is present. Pulmonic Valve: The pulmonic valve was normal in structure. Pulmonic valve regurgitation is not visualized. No evidence of pulmonic stenosis. Aorta: The aortic root is normal in size and structure. Venous: The inferior vena cava is normal in size with greater than 50% respiratory variability, suggesting right atrial pressure of 3 mmHg. IAS/Shunts: No atrial level shunt detected by color flow Doppler.  LEFT VENTRICLE PLAX 2D LVIDd:         4.20 cm      Diastology LVIDs:         3.20 cm      LV e' medial:    5.44 cm/s LV PW:         1.10 cm      LV E/e'  medial:  10.4 LV IVS:        1.10 cm      LV e' lateral:   7.40 cm/s LVOT diam:     1.90 cm      LV E/e' lateral: 7.7 LV SV:         64 LV SV Index:   33 LVOT Area:     2.84 cm  LV Volumes (MOD) LV vol d, MOD A2C: 174.0 ml LV vol s, MOD A2C: 108.0 ml LV SV MOD A2C:     66.0 ml RIGHT VENTRICLE             IVC RV S prime:     20.80 cm/s  IVC diam: 1.20 cm TAPSE (M-mode): 1.5 cm LEFT ATRIUM             Index         RIGHT ATRIUM           Index LA diam:        3.30 cm 1.72 cm/m   RA Area:     11.80 cm LA Vol (A2C):   46.3 ml 24.13 ml/m  RA Volume:   24.20 ml  12.61 ml/m LA Vol (A4C):   32.0 ml 16.68 ml/m LA Biplane Vol: 39.5 ml 20.59 ml/m  AORTIC VALVE LVOT Vmax:   122.00 cm/s LVOT Vmean:  77.800 cm/s LVOT VTI:    0.226 m  AORTA Ao Root diam: 3.00 cm Ao Asc diam:  3.30 cm MITRAL VALVE MV Area (PHT): 5.13 cm    SHUNTS MV Decel Time: 148 msec    Systemic VTI:  0.23 m MV E velocity: 56.80 cm/s  Systemic Diam: 1.90 cm MV A velocity: 90.70 cm/s MV E/A ratio:  0.63 Kardie Tobb DO Electronically signed by Berniece Salines DO Signature Date/Time: 06/08/2021/12:36:39 PM    Final     Cardiac Studies   Echo: IMPRESSIONS     1. Left ventricular ejection fraction, by estimation, is 35 to 40%. The  left ventricle has moderately decreased function. The left ventricle  demonstrates regional wall motion abnormalities (see scoring  diagram/findings for description). There is mild  concentric left ventricular hypertrophy. Left ventricular diastolic  parameters are consistent with Grade I diastolic dysfunction (impaired  relaxation).   2. Right ventricular systolic function is normal. The right ventricular  size is normal.   3. The mitral valve is normal in structure. No evidence of mitral valve  regurgitation. No evidence of mitral stenosis.   4. The aortic valve is normal in structure. Aortic valve regurgitation is  not visualized. No aortic stenosis is present.   5. The inferior vena cava is normal in size with greater than 50%  respiratory variability, suggesting right atrial pressure of 3 mmHg.   LEFT HEART CATH AND CORONARY ANGIOGRAPHY   Conclusion      Mid LAD lesion is 100% stenosed.   Prox RCA to Dist RCA lesion is 50% stenosed.   1st Mrg lesion is 60% stenosed.   LV end diastolic pressure is severely elevated.   The left ventricular ejection fraction is 25-35% by visual estimate.   1 chronically occluded  mid LAD stent with mild to moderate disease elsewhere. 2.  Severely elevated LVEDP and this is likely the reason for the patient's elevated troponin.  20 mg of Lasix IV was administered in the cardiac catheterization laboratory and will be continued.  Goal-directed medical therapy will be pursued. 3.  Severe left ventricular dysfunction with ejection fraction  approximately 25 to 30%.   Recommendations: Goal-directed medical therapy for acute on chronic systolic heart failure.   Coronary Diagrams  Diagnostic Dominance: Right Intervention  Patient Profile     51 y.o. male with a history of ST elevation myocardial infarction with PCI of the mid LAD in June 2022, cocaine abuse, hypertension, and hyperlipidemia who is being seen 06/08/2021 for the evaluation of sTEMI  Assessment & Plan    Acute anterior STEMI: Prior MI with stenting of the LAD. Reocclusion related  to ongoing cocaine use and noncompliance with medical therapy. Unable to reperfuse LAD. Completed infarct. No LV thrombus on Echo. Need to optimize medical therapy. Will treat with Entresto, Coreg. Switch Lasix to po today. May transfer to telemetry.    2.   Hyperlipidemia -high-dose atorvastatin  + for cocaine. Counseled on need for abstinence from cocaine.    3.  Acute on chronic systolic CHF. EF 35%. Ischemic. Will optimize medical therapy. On Entresto, Coreg, Jardiance. Start aldactone. Switch lasix to po. Follow BMET.     For questions or updates, please contact Cat[facility]act Please consult [email or web address] for contact info under        Signed, Sidda Humm [place], MD  06/09/2021, 7:56 AM

## 2021-06-09 NOTE — Progress Notes (Signed)
Called to see patient for hypotension Discussed with PA/nurse Cath 06/08/21 with "chronically occluded mid LAD stent but Troponin peak 4500 ECG with evolving anterior MI with T  Inversions and loss of R waves.   BP 86 mmHg systolic Was actually lower when seen by Dr Burt Knack earlier today. He is in sinus with no VT  Asymptomatic well perfused and conversant Extremities warm Basilar crackles No new murmur  Abdomen benign  No edema Skin with multiple tattoos  Cath site A   Hct 39.4 yesterday pending tonight Drug screen positive for cocaine and THC  Rhythm stable NSR no VT  Has received 500 cc saline this afternoon  Entresto, coreg aldactone and jardiance held But had this am and likely still in system  TTE with no effusion EF 35-40% normal RV No significant valve dx or mechanical complication From MI  He is asymptomatic and stable BP slightly better than  Earlier this afternoon Continue to hold diuretic, coreg And entresto. Fluid as needed Suspect BP will be 90 mmHg By am after meds wear off   Critical care time chart review, cath, echo labs CXR exam And composing note discussing with nurse 30 minutes   Jenkins Rouge MD [facility]

## 2021-06-09 NOTE — Progress Notes (Signed)
Afternoon rounding note: Called to see patient for persistent hypotension.  The patient has developed hypotension this afternoon with systolic blood pressures around 70 mmHg.  He is resting comfortably in bed.  He states that he feels fine.  He has no lightheadedness, headache, chest pain, or shortness of breath.  On my exam, he is alert and oriented, in no distress.  Lungs are clear to auscultation bilaterally, heart is regular rate and rhythm no murmur gallop, abdomen soft nontender, extremities have no edema, feet are warm and dry.  I suspect the patient's hypotension is related to post MI medical therapy that was initiated this morning.  He received carvedilol, Entresto, spironolactone, and Jardiance.  We will give him 250 cc of normal saline.  We will stop carvedilol and Entresto.  Continue other medications.  May need to reinstitute his medical therapy very slowly.  Reviewed his echocardiogram.  Reviewed and updated EKG which was just done and demonstrates evolving changes from his recent anterior infarct.  I do not think there are any other acute issues occurring at this time.  Personally discussed his case with the bedside RN.  Tonny Bollman 06/09/2021 4:00 PM

## 2021-06-09 NOTE — Plan of Care (Signed)
BP WDL now consistently. Pt calm, currently watching television. Switched to portable tele monitor. Denies CP.  Problem: Education: Goal: Understanding of CV disease, CV risk reduction, and recovery process will improve Outcome: Progressing   Problem: Activity: Goal: Ability to return to baseline activity level will improve Outcome: Progressing   Problem: Cardiovascular: Goal: Ability to achieve and maintain adequate cardiovascular perfusion will improve Outcome: Progressing Goal: Vascular access site(s) Level 0-1 will be maintained Outcome: Progressing   Problem: Health Behavior/Discharge Planning: Goal: Ability to safely manage health-related needs after discharge will improve Outcome: Progressing   Problem: Education: Goal: Knowledge of General Education information will improve Description: Including pain rating scale, medication(s)/side effects and non-pharmacologic comfort measures Outcome: Progressing   Problem: Health Behavior/Discharge Planning: Goal: Ability to manage health-related needs will improve Outcome: Progressing   Problem: Clinical Measurements: Goal: Ability to maintain clinical measurements within normal limits will improve Outcome: Progressing Goal: Will remain free from infection Outcome: Progressing Goal: Diagnostic test results will improve Outcome: Progressing Goal: Respiratory complications will improve Outcome: Progressing Goal: Cardiovascular complication will be avoided Outcome: Progressing   Problem: Activity: Goal: Risk for activity intolerance will decrease Outcome: Progressing   Problem: Nutrition: Goal: Adequate nutrition will be maintained Outcome: Progressing   Problem: Coping: Goal: Level of anxiety will decrease Outcome: Progressing   Problem: Elimination: Goal: Will not experience complications related to bowel motility Outcome: Progressing Goal: Will not experience complications related to urinary retention Outcome:  Progressing   Problem: Pain Managment: Goal: General experience of comfort will improve Outcome: Progressing   Problem: Safety: Goal: Ability to remain free from injury will improve Outcome: Progressing   Problem: Skin Integrity: Goal: Risk for impaired skin integrity will decrease Outcome: Progressing

## 2021-06-10 ENCOUNTER — Encounter (HOSPITAL_COMMUNITY): Payer: Self-pay | Admitting: Internal Medicine

## 2021-06-10 LAB — BASIC METABOLIC PANEL
Anion gap: 12 (ref 5–15)
BUN: 26 mg/dL — ABNORMAL HIGH (ref 6–20)
CO2: 24 mmol/L (ref 22–32)
Calcium: 9 mg/dL (ref 8.9–10.3)
Chloride: 96 mmol/L — ABNORMAL LOW (ref 98–111)
Creatinine, Ser: 1.65 mg/dL — ABNORMAL HIGH (ref 0.61–1.24)
GFR, Estimated: 51 mL/min — ABNORMAL LOW (ref >60)
Glucose, Bld: 99 mg/dL (ref 70–99)
Potassium: 3.5 mmol/L (ref 3.5–5.1)
Sodium: 132 mmol/L — ABNORMAL LOW (ref 135–145)

## 2021-06-10 LAB — CBC
HCT: 40 % (ref 39.0–52.0)
Hemoglobin: 13.8 g/dL (ref 13.0–17.0)
MCH: 30.9 pg (ref 26.0–34.0)
MCHC: 34.5 g/dL (ref 30.0–36.0)
MCV: 89.5 fL (ref 80.0–100.0)
Platelets: 239 10*3/uL (ref 150–400)
RBC: 4.47 MIL/uL (ref 4.22–5.81)
RDW: 12.1 % (ref 11.5–15.5)
WBC: 5.5 10*3/uL (ref 4.0–10.5)
nRBC: 0 % (ref 0.0–0.2)

## 2021-06-10 MED ORDER — ASPIRIN EC 81 MG PO TBEC
81.0000 mg | DELAYED_RELEASE_TABLET | Freq: Every day | ORAL | Status: DC
  Administered 2021-06-10 – 2021-06-13 (×4): 81 mg via ORAL
  Filled 2021-06-10 (×4): qty 1

## 2021-06-10 MED ORDER — POTASSIUM CHLORIDE CRYS ER 20 MEQ PO TBCR
40.0000 meq | EXTENDED_RELEASE_TABLET | Freq: Once | ORAL | Status: AC
  Administered 2021-06-10: 40 meq via ORAL
  Filled 2021-06-10: qty 2

## 2021-06-10 MED ORDER — EMPAGLIFLOZIN 10 MG PO TABS
10.0000 mg | ORAL_TABLET | Freq: Every day | ORAL | Status: DC
  Administered 2021-06-10 – 2021-06-13 (×4): 10 mg via ORAL
  Filled 2021-06-10 (×4): qty 1

## 2021-06-10 NOTE — Progress Notes (Addendum)
Progress Note  Patient Name: Christopher Gibson Date of Encounter: 06/10/2021  [facility] Cardiologist: Sinclair Grooms, MD   Subjective   Feels well today.  No dyspnea or chest pain. No dizziness. Developed significant hypotension yesterday and medications held.   Inpatient Medications    Scheduled Meds:  Chlorhexidine Gluconate Cloth  6 each Topical Daily   clopidogrel  75 mg Oral Q breakfast   rosuvastatin  40 mg Oral Daily   sodium chloride flush  3 mL Intravenous Q12H   Continuous Infusions:  sodium chloride     sodium chloride 250 mL (06/09/21 1646)   PRN Meds: sodium chloride, acetaminophen, nitroGLYCERIN, ondansetron (ZOFRAN) IV, sodium chloride flush   Vital Signs    Vitals:   06/10/21 0429 06/10/21 0500 06/10/21 0600 06/10/21 0700  BP:   98/64 108/66  Pulse: 85 71 (!) 107 91  Resp: 20 15 18 16   Temp:      TempSrc:      SpO2: 95% 97% 98% 96%  Weight:      Height:        Intake/Output Summary (Last 24 hours) at 06/10/2021 0732 Last data filed at 06/10/2021 0600 Gross per 24 hour  Intake 1770 ml  Output 950 ml  Net 820 ml    Last 3 Weights 06/08/2021 06/08/2021 05/02/2021  Weight (lbs) 181 lb 7 oz 170 lb 176 lb  Weight (kg) 82.3 kg 77.111 kg 79.833 kg      Telemetry    NSR - Personally Reviewed  ECG    None today. Yesterday with anterior infarct - Personally Reviewed  Physical Exam   GEN: No acute distress.   Neck: No JVD Cardiac: RRR, no murmurs, rubs, or gallops.  Respiratory: Clear to auscultation bilaterally. GI: Soft, nontender, non-distended  MS: No edema; No deformity. Neuro:  Nonfocal  Psych: Normal affect   Labs    High Sensitivity Troponin:   Recent Labs  Lab 06/08/21 0512  TROPONINIHS 4,552*      Chemistry Recent Labs  Lab 06/08/21 0512 06/08/21 0624 06/08/21 1800 06/10/21 0258  NA 136 138  --  132*  K 3.9 3.6 4.0 3.5  CL 101 102  --  96*  CO2 20*  --   --  24  GLUCOSE 124* 131*  --  99  BUN 15 16  --  26*   CREATININE 1.45* 1.10  --  1.65*  CALCIUM 9.7  --   --  9.0  MG  --   --  2.0  --   PROT 9.1*  --   --   --   ALBUMIN 4.4  --   --   --   AST 88*  --   --   --   ALT 18  --   --   --   ALKPHOS 64  --   --   --   BILITOT 0.7  --   --   --   GFRNONAA 59*  --   --  51*  ANIONGAP 15  --   --  12     Lipids  Recent Labs  Lab 06/08/21 0514  CHOL 167  TRIG 92  HDL 38*  LDLCALC 111*  CHOLHDL 4.4     Hematology Recent Labs  Lab 06/08/21 0512 06/08/21 0624 06/10/21 0258  WBC 9.5  --  5.5  RBC 4.96  --  4.47  HGB 15.2 14.6 13.8  HCT 46.6 43.0 40.0  MCV 94.0  --  89.5  MCH 30.6  --  30.9  MCHC 32.6  --  34.5  RDW 12.1  --  12.1  PLT 226  --  239    Thyroid No results for input(s): TSH, FREET4 in the last 168 hours.  BNPNo results for input(s): BNP, PROBNP in the last 168 hours.  DDimer No results for input(s): DDIMER in the last 168 hours.   Radiology    ECHOCARDIOGRAM COMPLETE  Result Date: 06/08/2021    ECHOCARDIOGRAM REPORT   Patient Name:   Christopher Gibson Date of Exam: 06/08/2021 Medical Rec #:  [id number]       Height:       66.0 in Accession #:    [id number]      Weight:       181.4 lb Date of Birth:  1970-08-24       BSA:          1.919 m Patient Age:    51 years        BP:           132/83 mmHg Patient Gender: M               HR:           77 bpm. Exam Location:  Inpatient Procedure: 2D Echo Indications:    acute myocardial infarction  History:        Patient has prior history of Echocardiogram examinations, most                 recent 03/18/2021. Signs/Symptoms:Chest Pain; Risk                 Factors:Hypertension and Dyslipidemia.  Sonographer:    Johny Chess RDCS Referring Phys: [id number] [place]  1. Left ventricular ejection fraction, by estimation, is 35 to 40%. The left ventricle has moderately decreased function. The left ventricle demonstrates regional wall motion abnormalities (see scoring diagram/findings for description). There is mild  concentric left ventricular hypertrophy. Left ventricular diastolic parameters are consistent with Grade I diastolic dysfunction (impaired relaxation).  2. Right ventricular systolic function is normal. The right ventricular size is normal.  3. The mitral valve is normal in structure. No evidence of mitral valve regurgitation. No evidence of mitral stenosis.  4. The aortic valve is normal in structure. Aortic valve regurgitation is not visualized. No aortic stenosis is present.  5. The inferior vena cava is normal in size with greater than 50% respiratory variability, suggesting right atrial pressure of 3 mmHg. FINDINGS  Left Ventricle: Left ventricular ejection fraction, by estimation, is 35 to 40%. The left ventricle has moderately decreased function. The left ventricle demonstrates regional wall motion abnormalities. Definity contrast agent was given IV to delineate the left ventricular endocardial borders. The left ventricular internal cavity size was normal in size. There is mild concentric left ventricular hypertrophy. Left ventricular diastolic parameters are consistent with Grade I diastolic dysfunction (impaired relaxation).  LV Wall Scoring: The mid and distal anterior wall, anterior septum, apical inferior segment, basal inferoseptal segment, and apex are akinetic. The antero-lateral wall, inferior wall, basal inferolateral segment, apical lateral segment, mid inferoseptal segment, apical septal segment, and basal anterior segment are hypokinetic. Right Ventricle: The right ventricular size is normal. No increase in right ventricular wall thickness. Right ventricular systolic function is normal. Left Atrium: Left atrial size was normal in size. Right Atrium: Right atrial size was normal in size. Pericardium: There is no evidence of pericardial effusion. Mitral Valve:  The mitral valve is normal in structure. No evidence of mitral valve regurgitation. No evidence of mitral valve stenosis. Tricuspid Valve:  The tricuspid valve is normal in structure. Tricuspid valve regurgitation is not demonstrated. No evidence of tricuspid stenosis. Aortic Valve: The aortic valve is normal in structure. Aortic valve regurgitation is not visualized. No aortic stenosis is present. Pulmonic Valve: The pulmonic valve was normal in structure. Pulmonic valve regurgitation is not visualized. No evidence of pulmonic stenosis. Aorta: The aortic root is normal in size and structure. Venous: The inferior vena cava is normal in size with greater than 50% respiratory variability, suggesting right atrial pressure of 3 mmHg. IAS/Shunts: No atrial level shunt detected by color flow Doppler.  LEFT VENTRICLE PLAX 2D LVIDd:         4.20 cm      Diastology LVIDs:         3.20 cm      LV e' medial:    5.44 cm/s LV PW:         1.10 cm      LV E/e' medial:  10.4 LV IVS:        1.10 cm      LV e' lateral:   7.40 cm/s LVOT diam:     1.90 cm      LV E/e' lateral: 7.7 LV SV:         64 LV SV Index:   33 LVOT Area:     2.84 cm  LV Volumes (MOD) LV vol d, MOD A2C: 174.0 ml LV vol s, MOD A2C: 108.0 ml LV SV MOD A2C:     66.0 ml RIGHT VENTRICLE             IVC RV S prime:     20.80 cm/s  IVC diam: 1.20 cm TAPSE (M-mode): 1.5 cm LEFT ATRIUM             Index        RIGHT ATRIUM           Index LA diam:        3.30 cm 1.72 cm/m   RA Area:     11.80 cm LA Vol (A2C):   46.3 ml 24.13 ml/m  RA Volume:   24.20 ml  12.61 ml/m LA Vol (A4C):   32.0 ml 16.68 ml/m LA Biplane Vol: 39.5 ml 20.59 ml/m  AORTIC VALVE LVOT Vmax:   122.00 cm/s LVOT Vmean:  77.800 cm/s LVOT VTI:    0.226 m  AORTA Ao Root diam: 3.00 cm Ao Asc diam:  3.30 cm MITRAL VALVE MV Area (PHT): 5.13 cm    SHUNTS MV Decel Time: 148 msec    Systemic VTI:  0.23 m MV E velocity: 56.80 cm/s  Systemic Diam: 1.90 cm MV A velocity: 90.70 cm/s MV E/A ratio:  0.63 Kardie Tobb DO Electronically signed by Berniece Salines DO Signature Date/Time: 06/08/2021/12:36:39 PM    Final     Cardiac Studies   Echo: IMPRESSIONS      1. Left ventricular ejection fraction, by estimation, is 35 to 40%. The  left ventricle has moderately decreased function. The left ventricle  demonstrates regional wall motion abnormalities (see scoring  diagram/findings for description). There is mild  concentric left ventricular hypertrophy. Left ventricular diastolic  parameters are consistent with Grade I diastolic dysfunction (impaired  relaxation).   2. Right ventricular systolic function is normal. The right ventricular  size is normal.   3. The mitral valve is normal  in structure. No evidence of mitral valve  regurgitation. No evidence of mitral stenosis.   4. The aortic valve is normal in structure. Aortic valve regurgitation is  not visualized. No aortic stenosis is present.   5. The inferior vena cava is normal in size with greater than 50%  respiratory variability, suggesting right atrial pressure of 3 mmHg.   LEFT HEART CATH AND CORONARY ANGIOGRAPHY   Conclusion      Mid LAD lesion is 100% stenosed.   Prox RCA to Dist RCA lesion is 50% stenosed.   1st Mrg lesion is 60% stenosed.   LV end diastolic pressure is severely elevated.   The left ventricular ejection fraction is 25-35% by visual estimate.   1 chronically occluded mid LAD stent with mild to moderate disease elsewhere. 2.  Severely elevated LVEDP and this is likely the reason for the patient's elevated troponin.  20 mg of Lasix IV was administered in the cardiac catheterization laboratory and will be continued.  Goal-directed medical therapy will be pursued. 3.  Severe left ventricular dysfunction with ejection fraction approximately 25 to 30%.   Recommendations: Goal-directed medical therapy for acute on chronic systolic heart failure.   Coronary Diagrams  Diagnostic Dominance: Right Intervention  Patient Profile     51 y.o. male with a history of ST elevation myocardial infarction with PCI of the mid LAD in June 2022, cocaine abuse, hypertension,  and hyperlipidemia who is being seen 06/08/2021 for the evaluation of sTEMI  Assessment & Plan    Acute anterior STEMI: Prior MI with stenting of the LAD. Reocclusion related  to ongoing cocaine use and noncompliance with medical therapy. Unable to reperfuse LAD. Completed infarct. No LV thrombus on Echo. Developed significant hypotension yesterday related to diuresis and meds. Entresto, Coreg, aldactone and lasix held today. Will monitor. May be able to resume Entresto at lower dose tomorrow. Will resume Jardiance.    2.   Hyperlipidemia -high-dose atorvastatin  + for cocaine. Counseled on need for abstinence from cocaine.    3.  Acute on chronic systolic CHF. EF 35%. Ischemic. Will optimize medical therapy as BP allows.   4. Acute on chronic renal insufficiency. Creatinine up to 1.65. likely due to meds and diruesis. Will monitor closely.      May progress with activity today. Ambulate. Transfer to telemetry.  For questions or updates, please contact [place] Please consult [email or web address] for contact info under        Signed, Amadu Schlageter [place], MD  06/10/2021, 7:32 AM

## 2021-06-11 DIAGNOSIS — I9581 Postprocedural hypotension: Secondary | ICD-10-CM

## 2021-06-11 DIAGNOSIS — I2109 ST elevation (STEMI) myocardial infarction involving other coronary artery of anterior wall: Secondary | ICD-10-CM

## 2021-06-11 LAB — BASIC METABOLIC PANEL
Anion gap: 7 (ref 5–15)
BUN: 23 mg/dL — ABNORMAL HIGH (ref 6–20)
CO2: 24 mmol/L (ref 22–32)
Calcium: 9.2 mg/dL (ref 8.9–10.3)
Chloride: 103 mmol/L (ref 98–111)
Creatinine, Ser: 1.07 mg/dL (ref 0.61–1.24)
GFR, Estimated: >60 mL/min (ref >60)
Glucose, Bld: 97 mg/dL (ref 70–99)
Potassium: 4.4 mmol/L (ref 3.5–5.1)
Sodium: 134 mmol/L — ABNORMAL LOW (ref 135–145)

## 2021-06-11 MED ORDER — SACUBITRIL-VALSARTAN 24-26 MG PO TABS
1.0000 | ORAL_TABLET | Freq: Two times a day (BID) | ORAL | Status: DC
  Administered 2021-06-11 – 2021-06-13 (×5): 1 via ORAL
  Filled 2021-06-11 (×5): qty 1

## 2021-06-11 NOTE — Progress Notes (Signed)
CARDIAC REHAB PHASE I   Offered to walk with pt. Pt states he would like to shower before walking in hallway. MI/HF education completed with pt. Pt given MI book along with heart healthy diet. Reviewed restrictions, site care, and exercise guidelines. Strongly encouraged daily weights and monitoring salt intake. Pt states awareness for substance cessation. Encouraged pt to walk throughout the weekend. Will refer to CRP II [place].  [id number] Reynold Bowen, RN BSN 06/11/2021 12:20 PM

## 2021-06-11 NOTE — Plan of Care (Signed)

## 2021-06-11 NOTE — Progress Notes (Signed)
Progress Note  Patient Name: Christopher Gibson Date of Encounter: 06/11/2021  [facility] Cardiologist: Sinclair Grooms, MD   Subjective   Feels well BP better wants to ambulate and shower   Inpatient Medications    Scheduled Meds:  aspirin EC  81 mg Oral Daily   clopidogrel  75 mg Oral Q breakfast   empagliflozin  10 mg Oral Daily   rosuvastatin  40 mg Oral Daily   sodium chloride flush  3 mL Intravenous Q12H   Continuous Infusions:  sodium chloride     sodium chloride 250 mL (06/09/21 1646)   PRN Meds: sodium chloride, acetaminophen, nitroGLYCERIN, ondansetron (ZOFRAN) IV, sodium chloride flush   Vital Signs    Vitals:   06/10/21 2108 06/11/21 0017 06/11/21 0415 06/11/21 0945  BP: 101/63 (!) 94/58 (!) 101/57 114/71  Pulse:  92 81   Resp: 16 16 17 15   Temp: 98.9 F (37.2 C) 98.3 F (36.8 C) 98 F (36.7 C)   TempSrc: Oral Oral Oral   SpO2: 96% 97% 100%   Weight:   82.6 kg   Height:        Intake/Output Summary (Last 24 hours) at 06/11/2021 1030 Last data filed at 06/10/2021 2300 Gross per 24 hour  Intake 1080 ml  Output --  Net 1080 ml   Last 3 Weights 06/11/2021 06/08/2021 06/08/2021  Weight (lbs) 182 lb 3.2 oz 181 lb 7 oz 170 lb  Weight (kg) 82.645 kg 82.3 kg 77.111 kg      Telemetry    NSR - Personally Reviewed 06/11/2021   ECG    None today. Yesterday with anterior infarct - Personally Reviewed  Physical Exam   GEN: No acute distress.   Neck: No JVD Cardiac: RRR, no murmurs, rubs, or gallops.  Respiratory: Clear to auscultation bilaterally. GI: Soft, nontender, non-distended  MS: No edema; No deformity. Neuro:  Nonfocal  Psych: Normal affect   Labs    High Sensitivity Troponin:   Recent Labs  Lab 06/08/21 0512  TROPONINIHS 4,552*     Chemistry Recent Labs  Lab 06/08/21 0512 06/08/21 0624 06/08/21 1800 06/10/21 0258  NA 136 138  --  132*  K 3.9 3.6 4.0 3.5  CL 101 102  --  96*  CO2 20*  --   --  24  GLUCOSE 124* 131*  --  99   BUN 15 16  --  26*  CREATININE 1.45* 1.10  --  1.65*  CALCIUM 9.7  --   --  9.0  MG  --   --  2.0  --   PROT 9.1*  --   --   --   ALBUMIN 4.4  --   --   --   AST 88*  --   --   --   ALT 18  --   --   --   ALKPHOS 64  --   --   --   BILITOT 0.7  --   --   --   GFRNONAA 59*  --   --  51*  ANIONGAP 15  --   --  12    Lipids  Recent Labs  Lab 06/08/21 0514  CHOL 167  TRIG 92  HDL 38*  LDLCALC 111*  CHOLHDL 4.4    Hematology Recent Labs  Lab 06/08/21 0512 06/08/21 0624 06/10/21 0258  WBC 9.5  --  5.5  RBC 4.96  --  4.47  HGB 15.2 14.6 13.8  HCT 46.6 43.0 40.0  MCV 94.0  --  89.5  MCH 30.6  --  30.9  MCHC 32.6  --  34.5  RDW 12.1  --  12.1  PLT 226  --  239   Thyroid No results for input(s): TSH, FREET4 in the last 168 hours.  BNPNo results for input(s): BNP, PROBNP in the last 168 hours.  DDimer No results for input(s): DDIMER in the last 168 hours.   Radiology    No results found.  Cardiac Studies   Echo: IMPRESSIONS     1. Left ventricular ejection fraction, by estimation, is 35 to 40%. The  left ventricle has moderately decreased function. The left ventricle  demonstrates regional wall motion abnormalities (see scoring  diagram/findings for description). There is mild  concentric left ventricular hypertrophy. Left ventricular diastolic  parameters are consistent with Grade I diastolic dysfunction (impaired  relaxation).   2. Right ventricular systolic function is normal. The right ventricular  size is normal.   3. The mitral valve is normal in structure. No evidence of mitral valve  regurgitation. No evidence of mitral stenosis.   4. The aortic valve is normal in structure. Aortic valve regurgitation is  not visualized. No aortic stenosis is present.   5. The inferior vena cava is normal in size with greater than 50%  respiratory variability, suggesting right atrial pressure of 3 mmHg.   LEFT HEART CATH AND CORONARY ANGIOGRAPHY   Conclusion       Mid LAD lesion is 100% stenosed.   Prox RCA to Dist RCA lesion is 50% stenosed.   1st Mrg lesion is 60% stenosed.   LV end diastolic pressure is severely elevated.   The left ventricular ejection fraction is 25-35% by visual estimate.   1 chronically occluded mid LAD stent with mild to moderate disease elsewhere. 2.  Severely elevated LVEDP and this is likely the reason for the patient's elevated troponin.  20 mg of Lasix IV was administered in the cardiac catheterization laboratory and will be continued.  Goal-directed medical therapy will be pursued. 3.  Severe left ventricular dysfunction with ejection fraction approximately 25 to 30%.   Recommendations: Goal-directed medical therapy for acute on chronic systolic heart failure.   Coronary Diagrams  Diagnostic Dominance: Right Intervention  Patient Profile     51 y.o. male with a history of ST elevation myocardial infarction with PCI of the mid LAD in June 2022, cocaine abuse, hypertension, and hyperlipidemia who is being seen 06/08/2021 for the evaluation of sTEMI  Assessment & Plan    Acute anterior STEMI: Prior MI with stenting of the LAD. Reocclusion related  to ongoing cocaine use and noncompliance with medical therapy. Unable to reperfuse LAD. Completed infarct. No LV thrombus on Echo. Try to start low dose entresto today Ok to ambulate and shower    2.   Hyperlipidemia -high-dose atorvastatin  + for cocaine. Counseled on need for abstinence from cocaine.    3.  Acute on chronic systolic CHF. EF 35%. Ischemic GDMT hindered by low BP although patient asymptomatic with it see above  4. Acute on chronic renal insufficiency. Creatinine up to 1.65. likely due to meds and diruesis. Will monitor closely.      Ambulate , shower attempt Rx with entresto follow Cr and BP closely  For questions or updates, please contact [place] Please consult [email or web address] for contact info under        Signed, Jenkins Rouge, MD   06/11/2021, 10:30 AM

## 2021-06-12 NOTE — Progress Notes (Signed)
Progress Note  Patient Name: Christopher Gibson Date of Encounter: 06/12/2021  [facility] Cardiologist: Sinclair Grooms, MD   Subjective   Feels well BP better walked/shower yesterday tolerated entresto for first time yesterday  Inpatient Medications    Scheduled Meds:  aspirin EC  81 mg Oral Daily   clopidogrel  75 mg Oral Q breakfast   empagliflozin  10 mg Oral Daily   rosuvastatin  40 mg Oral Daily   sacubitril-valsartan  1 tablet Oral BID   sodium chloride flush  3 mL Intravenous Q12H   Continuous Infusions:  sodium chloride     sodium chloride 250 mL (06/09/21 1646)   PRN Meds: sodium chloride, acetaminophen, nitroGLYCERIN, ondansetron (ZOFRAN) IV, sodium chloride flush   Vital Signs    Vitals:   06/11/21 0945 06/11/21 1105 06/11/21 2012 06/12/21 0434  BP: 114/71 111/71 108/76 108/74  Pulse:  81 86 83  Resp: 15 14  15   Temp:  98.5 F (36.9 C) 98.6 F (37 C) 98.3 F (36.8 C)  TempSrc:  Oral Oral Oral  SpO2:    98%  Weight:      Height:       No intake or output data in the 24 hours ending 06/12/21 0935  Last 3 Weights 06/11/2021 06/08/2021 06/08/2021  Weight (lbs) 182 lb 3.2 oz 181 lb 7 oz 170 lb  Weight (kg) 82.645 kg 82.3 kg 77.111 kg      Telemetry    NSR - Personally Reviewed 06/12/2021   ECG    None today. Yesterday with anterior infarct - Personally Reviewed  Physical Exam   GEN: No acute distress.   Neck: No JVD Cardiac: RRR, no murmurs, rubs, or gallops.  Respiratory: Clear to auscultation bilaterally. GI: Soft, nontender, non-distended  MS: No edema; No deformity. Neuro:  Nonfocal  Psych: Normal affect   Labs    High Sensitivity Troponin:   Recent Labs  Lab 06/08/21 0512  TROPONINIHS 4,552*     Chemistry Recent Labs  Lab 06/08/21 0512 06/08/21 0624 06/08/21 1800 06/10/21 0258 06/11/21 1056  NA 136 138  --  132* 134*  K 3.9 3.6 4.0 3.5 4.4  CL 101 102  --  96* 103  CO2 20*  --   --  24 24  GLUCOSE 124* 131*  --  99 97   BUN 15 16  --  26* 23*  CREATININE 1.45* 1.10  --  1.65* 1.07  CALCIUM 9.7  --   --  9.0 9.2  MG  --   --  2.0  --   --   PROT 9.1*  --   --   --   --   ALBUMIN 4.4  --   --   --   --   AST 88*  --   --   --   --   ALT 18  --   --   --   --   ALKPHOS 64  --   --   --   --   BILITOT 0.7  --   --   --   --   GFRNONAA 59*  --   --  51* >60  ANIONGAP 15  --   --  12 7    Lipids  Recent Labs  Lab 06/08/21 0514  CHOL 167  TRIG 92  HDL 38*  LDLCALC 111*  CHOLHDL 4.4    Hematology Recent Labs  Lab 06/08/21 0512 06/08/21 0624 06/10/21  0258  WBC 9.5  --  5.5  RBC 4.96  --  4.47  HGB 15.2 14.6 13.8  HCT 46.6 43.0 40.0  MCV 94.0  --  89.5  MCH 30.6  --  30.9  MCHC 32.6  --  34.5  RDW 12.1  --  12.1  PLT 226  --  239   Thyroid No results for input(s): TSH, FREET4 in the last 168 hours.  BNPNo results for input(s): BNP, PROBNP in the last 168 hours.  DDimer No results for input(s): DDIMER in the last 168 hours.   Radiology    No results found.  Cardiac Studies   Echo: IMPRESSIONS     1. Left ventricular ejection fraction, by estimation, is 35 to 40%. The  left ventricle has moderately decreased function. The left ventricle  demonstrates regional wall motion abnormalities (see scoring  diagram/findings for description). There is mild  concentric left ventricular hypertrophy. Left ventricular diastolic  parameters are consistent with Grade I diastolic dysfunction (impaired  relaxation).   2. Right ventricular systolic function is normal. The right ventricular  size is normal.   3. The mitral valve is normal in structure. No evidence of mitral valve  regurgitation. No evidence of mitral stenosis.   4. The aortic valve is normal in structure. Aortic valve regurgitation is  not visualized. No aortic stenosis is present.   5. The inferior vena cava is normal in size with greater than 50%  respiratory variability, suggesting right atrial pressure of 3 mmHg.   LEFT  HEART CATH AND CORONARY ANGIOGRAPHY   Conclusion      Mid LAD lesion is 100% stenosed.   Prox RCA to Dist RCA lesion is 50% stenosed.   1st Mrg lesion is 60% stenosed.   LV end diastolic pressure is severely elevated.   The left ventricular ejection fraction is 25-35% by visual estimate.   1 chronically occluded mid LAD stent with mild to moderate disease elsewhere. 2.  Severely elevated LVEDP and this is likely the reason for the patient's elevated troponin.  20 mg of Lasix IV was administered in the cardiac catheterization laboratory and will be continued.  Goal-directed medical therapy will be pursued. 3.  Severe left ventricular dysfunction with ejection fraction approximately 25 to 30%.   Recommendations: Goal-directed medical therapy for acute on chronic systolic heart failure.   Coronary Diagrams  Diagnostic Dominance: Right Intervention  Patient Profile     51 y.o. male with a history of ST elevation myocardial infarction with PCI of the mid LAD in June 2022, cocaine abuse, hypertension, and hyperlipidemia who is being seen 06/08/2021 for the evaluation of sTEMI  Assessment & Plan    Acute anterior STEMI: Prior MI with stenting of the LAD. Reocclusion related  to ongoing cocaine use and noncompliance with medical therapy. Unable to reperfuse LAD. Completed infarct. No LV thrombus on Echo.ASA/Plavix consider starting coreg as outpatient if BP ok and he abstains from cocaine    2.   Hyperlipidemia -high-dose atorvastatin  + for cocaine. Counseled on need for abstinence from cocaine.    3.  Acute on chronic systolic CHF. EF 35%. Ischemic GDMT hindered by low BP continue low dose bid entresto He was hypotensive with Jardiance and aldactone   4. Acute on chronic renal insufficiency. Creatinine better 1.07   D/c in am if stable   For questions or updates, please contact [facility] Please consult [email or web address] for contact info under        Signed,  Jenkins Rouge, MD   06/12/2021, 9:35 AM

## 2021-06-13 ENCOUNTER — Other Ambulatory Visit (HOSPITAL_COMMUNITY): Payer: Self-pay

## 2021-06-13 DIAGNOSIS — I10 Essential (primary) hypertension: Secondary | ICD-10-CM

## 2021-06-13 DIAGNOSIS — E782 Mixed hyperlipidemia: Secondary | ICD-10-CM

## 2021-06-13 DIAGNOSIS — E785 Hyperlipidemia, unspecified: Secondary | ICD-10-CM

## 2021-06-13 DIAGNOSIS — I5041 Acute combined systolic (congestive) and diastolic (congestive) heart failure: Secondary | ICD-10-CM

## 2021-06-13 LAB — BASIC METABOLIC PANEL
Anion gap: 9 (ref 5–15)
BUN: 16 mg/dL (ref 6–20)
CO2: 23 mmol/L (ref 22–32)
Calcium: 9.7 mg/dL (ref 8.9–10.3)
Chloride: 101 mmol/L (ref 98–111)
Creatinine, Ser: 0.88 mg/dL (ref 0.61–1.24)
GFR, Estimated: >60 mL/min (ref >60)
Glucose, Bld: 100 mg/dL — ABNORMAL HIGH (ref 70–99)
Potassium: 5.1 mmol/L (ref 3.5–5.1)
Sodium: 133 mmol/L — ABNORMAL LOW (ref 135–145)

## 2021-06-13 MED ORDER — ROSUVASTATIN CALCIUM 40 MG PO TABS
40.0000 mg | ORAL_TABLET | Freq: Every day | ORAL | 1 refills | Status: DC
  Filled 2021-06-13: qty 90, 90d supply, fill #0

## 2021-06-13 MED ORDER — CLOPIDOGREL BISULFATE 75 MG PO TABS
75.0000 mg | ORAL_TABLET | Freq: Every day | ORAL | 1 refills | Status: DC
  Filled 2021-06-13: qty 90, 90d supply, fill #0

## 2021-06-13 MED ORDER — ASPIRIN 81 MG PO TBEC
81.0000 mg | DELAYED_RELEASE_TABLET | Freq: Every day | ORAL | 1 refills | Status: DC
  Filled 2021-06-13: qty 90, 90d supply, fill #0

## 2021-06-13 MED ORDER — EMPAGLIFLOZIN 10 MG PO TABS
10.0000 mg | ORAL_TABLET | Freq: Every day | ORAL | 1 refills | Status: DC
  Filled 2021-06-13: qty 90, 90d supply, fill #0

## 2021-06-13 MED ORDER — NITROGLYCERIN 0.4 MG SL SUBL
0.4000 mg | SUBLINGUAL_TABLET | SUBLINGUAL | 2 refills | Status: DC | PRN
  Filled 2021-06-13: qty 25, 7d supply, fill #0

## 2021-06-13 MED ORDER — SACUBITRIL-VALSARTAN 24-26 MG PO TABS
1.0000 | ORAL_TABLET | Freq: Two times a day (BID) | ORAL | 2 refills | Status: DC
  Filled 2021-06-13: qty 60, 30d supply, fill #0

## 2021-06-13 NOTE — Discharge Summary (Addendum)
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   Prox RCA to Dist RCA lesion is 50% stenosed.   1st Mrg lesion is 60% stenosed.   LV end diastolic pressure is severely elevated.   The left ventricular ejection fraction is 25-35% by visual estimate.   Chronically occluded mid LAD stent (as evidenced by lack of dye staining, difficulty crossing the occlusion with a wire, and inability to pass a balloon) with mild to moderate disease elsewhere.  Severely elevated LVEDP and this is likely the reason for the patient's elevated troponin.  20 mg of Lasix IV was administered in the cardiac catheterization laboratory and will be continued.  Goal-directed medical therapy will be pursued. 3.  Severe left ventricular dysfunction with ejection fraction approximately 25 to 30%.   Recommendations: Goal-directed medical therapy for acute on chronic systolic heart failure.  Diagnostic Dominance: Right   Echo: 06/08/21  IMPRESSIONS     1. Left ventricular ejection fraction, by estimation, is 35 to 40%. The  left ventricle has moderately decreased function. The left ventricle  demonstrates regional wall motion abnormalities (see scoring  diagram/findings for description). There is mild  concentric left ventricular hypertrophy. Left ventricular diastolic  parameters are consistent with Grade I diastolic dysfunction (impaired  relaxation).   2. Right ventricular systolic function is normal. The right  ventricular  size is normal.   3. The mitral valve is normal in structure. No evidence of mitral valve  regurgitation. No evidence of mitral stenosis.   4. The aortic valve is normal in structure. Aortic valve regurgitation is  not visualized. No aortic stenosis is present.   5. The inferior vena cava is normal in size with greater than 50%  respiratory variability, suggesting right atrial pressure of 3 mmHg.   FINDINGS   Left Ventricle: Left ventricular ejection fraction, by estimation, is 35  to 40%. The left ventricle has moderately decreased function. The left  ventricle demonstrates regional wall motion abnormalities. Definity  contrast agent was given IV to delineate  the left ventricular endocardial borders. The left ventricular internal  cavity size was normal in size. There is mild concentric left ventricular  hypertrophy. Left ventricular diastolic parameters are consistent with  Grade I diastolic dysfunction  (impaired relaxation).      LV Wall Scoring:  The mid and distal anterior wall, anterior septum, apical inferior  segment,  basal inferoseptal segment, and apex are akinetic. The antero-lateral  wall,  inferior wall, basal inferolateral segment, apical lateral segment, mid  inferoseptal segment, apical septal segment, and basal anterior segment  are  hypokinetic.   Right Ventricle: The right ventricular size is normal. No increase in  right ventricular wall thickness. Right ventricular systolic function is  normal.   Left Atrium: Left atrial size was normal in size.   Right Atrium: Right atrial size was normal in size.   Pericardium: There is no evidence of pericardial effusion.   Mitral Valve: The mitral  valve is normal in structure. No evidence of  mitral valve regurgitation. No evidence of mitral valve stenosis.   Tricuspid Valve: The tricuspid valve is normal in structure. Tricuspid  valve regurgitation is not demonstrated. No evidence of tricuspid   stenosis.   Aortic Valve: The aortic valve is normal in structure. Aortic valve  regurgitation is not visualized. No aortic stenosis is present.   Pulmonic Valve: The pulmonic valve was normal in structure. Pulmonic valve  regurgitation is not visualized. No evidence of pulmonic stenosis.   Aorta: The aortic root is normal in size and structure.   Venous: The inferior vena cava is normal in size with greater than 50%  respiratory variability, suggesting right atrial pressure of 3 mmHg.   IAS/Shunts: No atrial level shunt detected by color flow Doppler.  _____________   History of Present Illness     Christopher Gibson is a 51 y.o. male with a history of ST elevation myocardial infarction with PCI of the mid LAD in June 2022, cocaine abuse, hypertension, and hyperlipidemia. Mr. Pedrotti presented after 5 hours of chest pain.  He reported running out of his medications including his dual antiplatelet therapy for approximately 4 days.  He reported developing acute onset 8 out of 10 chest pain. This was reminiscent of his prior ST elevation myocardial infarction symptoms.  However it was different in the fact that he was not short of breath.  He had a little bit of diaphoresis but no nausea.  Because of the pain he presented to the emergency department a few hours later.  His blood pressure was elevated.  EKG demonstrated sinus rhythm with anterior ST elevations with associated Q waves. He was evaluated in the ED and brought to the cath lab emergently.   Hospital Course     Anterior STEMI: prior stenting of the LAD. Noted to have reocclusion of LAD stent in the setting of noncompliance with medications as well as cocaine use. Unsuccessful PCI of LAD, completed infarct.  Placed on DAPT with ASA/plavix. No recurrent chest pain. Seen by CR -- continue on ASA, plavix, statin, entresto  HFrEF/ICM: LVEF of 35-40%, G1DD. GDMT limited in the setting of hypotension. -- tolerating low dose entresto, no BB  with soft BPs. Consider adding as an outpatient if able to abstain from cocaine use -- added jardiance   HLD: LDL 111, had been out of his medications PTA -- on Crestor 40mg  daily  -- will need FLP/LFTs in 8 weeks  Cocaine use: + on admission -- cessation counseling   Hx of LV thrombus: previously treated 6/22, no thrombus noted on echo this admission.  CM assistance with medications at discharge. Patient was seen by Dr. Irish Lack and deemed stable for discharge home. Medications sent to [place]. Educated by pharmD prior to discharge.   Did the patient have an acute coronary syndrome (MI, NSTEMI, STEMI, etc) this admission?:  Yes                               AHA/ACC Clinical Performance & Quality Measures: Aspirin prescribed? - Yes ADP Receptor Inhibitor (Plavix/Clopidogrel, Brilinta/Ticagrelor or Effient/Prasugrel) prescribed (includes medically managed patients)? - Yes Beta Blocker prescribed? - No - cocaine + on admission, soft blood pressures High Intensity Statin (Lipitor 40-80mg  or Crestor 20-40mg ) prescribed? - Yes EF assessed during THIS hospitalization? - Yes For EF <40%, was ACEI/ARB prescribed? - Yes For EF <40%, Aldosterone Antagonist (Spironolactone or Eplerenone)  prescribed? - No - Reason:  consider as an outpatient  Cardiac Rehab Phase II ordered (including medically managed patients)? - Yes       The patient will be scheduled for a TOC follow up appointment in 10-14 days.  A message has been sent to the [facility] and Scheduling Pool at the office where the patient should be seen for follow up.  _____________  Discharge Vitals Blood pressure 133/77, pulse 87, temperature 98.4 F (36.9 C), temperature source Oral, resp. rate 18, height 5\' 6"  (1.676 m), weight 82.7 kg, SpO2 100 %.  Filed Weights   06/08/21 0700 06/11/21 0415 06/13/21 0500  Weight: 82.3 kg 82.6 kg 82.7 kg    Labs & Radiologic Studies    CBC No results for input(s): WBC, NEUTROABS, HGB, HCT,  MCV, PLT in the last 72 hours. Basic Metabolic Panel Recent Labs    06/11/21 1056  NA 134*  K 4.4  CL 103  CO2 24  GLUCOSE 97  BUN 23*  CREATININE 1.07  CALCIUM 9.2   Liver Function Tests No results for input(s): AST, ALT, ALKPHOS, BILITOT, PROT, ALBUMIN in the last 72 hours. No results for input(s): LIPASE, AMYLASE in the last 72 hours. High Sensitivity Troponin:   Recent Labs  Lab 06/08/21 0512  TROPONINIHS 4,552*    BNP Invalid input(s): POCBNP D-Dimer No results for input(s): DDIMER in the last 72 hours. Hemoglobin A1C No results for input(s): HGBA1C in the last 72 hours. Fasting Lipid Panel No results for input(s): CHOL, HDL, LDLCALC, TRIG, CHOLHDL, LDLDIRECT in the last 72 hours. Thyroid Function Tests No results for input(s): TSH, T4TOTAL, T3FREE, THYROIDAB in the last 72 hours.  Invalid input(s): FREET3 _____________  CARDIAC CATHETERIZATION  Addendum Date: 06/10/2021     Mid LAD lesion is 100% stenosed.   Prox RCA to Dist RCA lesion is 50% stenosed.   1st Mrg lesion is 60% stenosed.   LV end diastolic pressure is severely elevated.   The left ventricular ejection fraction is 25-35% by visual estimate. Chronically occluded mid LAD stent (as evidenced by lack of dye staining, difficulty crossing the occlusion with a wire, and inability to pass a balloon) with mild to moderate disease elsewhere. Severely elevated LVEDP and this is likely the reason for the patient's elevated troponin.  20 mg of Lasix IV was administered in the cardiac catheterization laboratory and will be continued.  Goal-directed medical therapy will be pursued. 3.  Severe left ventricular dysfunction with ejection fraction approximately 25 to 30%. Recommendations: Goal-directed medical therapy for acute on chronic systolic heart failure.  Addendum Date: 06/08/2021     Mid LAD lesion is 100% stenosed.   Prox RCA to Dist RCA lesion is 50% stenosed.   1st Mrg lesion is 60% stenosed.   LV end diastolic  pressure is severely elevated.   The left ventricular ejection fraction is 25-35% by visual estimate. 1 chronically occluded mid LAD stent with mild to moderate disease elsewhere. 2.  Severely elevated LVEDP and this is likely the reason for the patient's elevated troponin.  20 mg of Lasix IV was administered in the cardiac catheterization laboratory and will be continued.  Goal-directed medical therapy will be pursued. 3.  Severe left ventricular dysfunction with ejection fraction approximately 25 to 30%. Recommendations: Goal-directed medical therapy for acute on chronic systolic heart failure.  Addendum Date: 06/08/2021     Mid LAD lesion is 100% stenosed.   Prox RCA to Dist RCA lesion is 50% stenosed.  1st Mrg lesion is 60% stenosed.   LV end diastolic pressure is severely elevated.   The left ventricular ejection fraction is 25-35% by visual estimate. 1 chronically occluded mid LAD stent with mild to moderate disease elsewhere. 2.  Severely elevated LVEDP and this is likely the reason for the patient's elevated troponin.  20 mg of Lasix IV was administered in the cardiac catheterization laboratory and will be continued.  Goal-directed medical therapy will be pursued. 3.  Severe left ventricular dysfunction with ejection fraction approximately 25 to 30%. Recommendations: Goal-directed medical therapy for acute on chronic systolic heart failure.  Result Date: 06/10/2021   Mid LAD lesion is 100% stenosed.   Prox RCA to Dist RCA lesion is 50% stenosed.   1st Mrg lesion is 60% stenosed.   LV end diastolic pressure is severely elevated.   The left ventricular ejection fraction is 25-35% by visual estimate. 1 chronically occluded mid LAD stent with mild to moderate disease elsewhere. 2.  Severely elevated LVEDP and this is likely the reason for the patient's elevated troponin.  20 mg of Lasix IV was administered in the cardiac catheterization laboratory and will be continued.  Goal-directed medical therapy will be  pursued. 3.  Severe left ventricular dysfunction with ejection fraction approximately 25 to 30%. Recommendations: Goal-directed medical therapy for acute on chronic systolic heart failure.   DG Chest Portable 1 View  Result Date: 06/08/2021 CLINICAL DATA:  Chest pain EXAM: PORTABLE CHEST 1 VIEW COMPARISON:  05/02/2021 FINDINGS: The heart size and mediastinal contours are within normal limits. Both lungs are clear. The visualized skeletal structures are unremarkable. IMPRESSION: No active disease. Electronically Signed   By: Kathreen Devoid M.D.   On: 06/08/2021 06:16   ECHOCARDIOGRAM COMPLETE  Result Date: 06/08/2021    ECHOCARDIOGRAM REPORT   Patient Name:   KEYDON MARCO Date of Exam: 06/08/2021 Medical Rec #:  [id number]       Height:       66.0 in Accession #:    [id number]      Weight:       181.4 lb Date of Birth:  1970-05-19       BSA:          1.919 m Patient Age:    72 years        BP:           132/83 mmHg Patient Gender: M               HR:           77 bpm. Exam Location:  Inpatient Procedure: 2D Echo Indications:    acute myocardial infarction  History:        Patient has prior history of Echocardiogram examinations, most                 recent 03/18/2021. Signs/Symptoms:Chest Pain; Risk                 Factors:Hypertension and Dyslipidemia.  Sonographer:    Johny Chess RDCS Referring Phys: [id number] [place]  1. Left ventricular ejection fraction, by estimation, is 35 to 40%. The left ventricle has moderately decreased function. The left ventricle demonstrates regional wall motion abnormalities (see scoring diagram/findings for description). There is mild concentric left ventricular hypertrophy. Left ventricular diastolic parameters are consistent with Grade I diastolic dysfunction (impaired relaxation).  2. Right ventricular systolic function is normal. The right ventricular size is normal.  3. The mitral valve is normal  in structure. No evidence of mitral valve regurgitation.  No evidence of mitral stenosis.  4. The aortic valve is normal in structure. Aortic valve regurgitation is not visualized. No aortic stenosis is present.  5. The inferior vena cava is normal in size with greater than 50% respiratory variability, suggesting right atrial pressure of 3 mmHg. FINDINGS  Left Ventricle: Left ventricular ejection fraction, by estimation, is 35 to 40%. The left ventricle has moderately decreased function. The left ventricle demonstrates regional wall motion abnormalities. Definity contrast agent was given IV to delineate the left ventricular endocardial borders. The left ventricular internal cavity size was normal in size. There is mild concentric left ventricular hypertrophy. Left ventricular diastolic parameters are consistent with Grade I diastolic dysfunction (impaired relaxation).  LV Wall Scoring: The mid and distal anterior wall, anterior septum, apical inferior segment, basal inferoseptal segment, and apex are akinetic. The antero-lateral wall, inferior wall, basal inferolateral segment, apical lateral segment, mid inferoseptal segment, apical septal segment, and basal anterior segment are hypokinetic. Right Ventricle: The right ventricular size is normal. No increase in right ventricular wall thickness. Right ventricular systolic function is normal. Left Atrium: Left atrial size was normal in size. Right Atrium: Right atrial size was normal in size. Pericardium: There is no evidence of pericardial effusion. Mitral Valve: The mitral valve is normal in structure. No evidence of mitral valve regurgitation. No evidence of mitral valve stenosis. Tricuspid Valve: The tricuspid valve is normal in structure. Tricuspid valve regurgitation is not demonstrated. No evidence of tricuspid stenosis. Aortic Valve: The aortic valve is normal in structure. Aortic valve regurgitation is not visualized. No aortic stenosis is present. Pulmonic Valve: The pulmonic valve was normal in structure. Pulmonic  valve regurgitation is not visualized. No evidence of pulmonic stenosis. Aorta: The aortic root is normal in size and structure. Venous: The inferior vena cava is normal in size with greater than 50% respiratory variability, suggesting right atrial pressure of 3 mmHg. IAS/Shunts: No atrial level shunt detected by color flow Doppler.  LEFT VENTRICLE PLAX 2D LVIDd:         4.20 cm      Diastology LVIDs:         3.20 cm      LV e' medial:    5.44 cm/s LV PW:         1.10 cm      LV E/e' medial:  10.4 LV IVS:        1.10 cm      LV e' lateral:   7.40 cm/s LVOT diam:     1.90 cm      LV E/e' lateral: 7.7 LV SV:         64 LV SV Index:   33 LVOT Area:     2.84 cm  LV Volumes (MOD) LV vol d, MOD A2C: 174.0 ml LV vol s, MOD A2C: 108.0 ml LV SV MOD A2C:     66.0 ml RIGHT VENTRICLE             IVC RV S prime:     20.80 cm/s  IVC diam: 1.20 cm TAPSE (M-mode): 1.5 cm LEFT ATRIUM             Index        RIGHT ATRIUM           Index LA diam:        3.30 cm 1.72 cm/m   RA Area:     11.80 cm LA Vol (A2C):  46.3 ml 24.13 ml/m  RA Volume:   24.20 ml  12.61 ml/m LA Vol (A4C):   32.0 ml 16.68 ml/m LA Biplane Vol: 39.5 ml 20.59 ml/m  AORTIC VALVE LVOT Vmax:   122.00 cm/s LVOT Vmean:  77.800 cm/s LVOT VTI:    0.226 m  AORTA Ao Root diam: 3.00 cm Ao Asc diam:  3.30 cm MITRAL VALVE MV Area (PHT): 5.13 cm    SHUNTS MV Decel Time: 148 msec    Systemic VTI:  0.23 m MV E velocity: 56.80 cm/s  Systemic Diam: 1.90 cm MV A velocity: 90.70 cm/s MV E/A ratio:  0.63 Kardie Tobb DO Electronically signed by Berniece Salines DO Signature Date/Time: 06/08/2021/12:36:39 PM    Final    Disposition   Pt is being discharged home today in good condition.  Follow-up Plans & Appointments     Follow-up Information     Ladell Pier, MD Follow up.   Specialty: Internal Medicine Why: TIME : 2:30 PM DATE: MARCH 09,2023 DOCTORKarle Plumber , MD LOCATION : [facility] . [id number] Contact information: [place] [address] [phone]         Loel Dubonnet, NP Follow up on 06/23/2021.   Specialty: Cardiology Why: at 10am for your follow up appt with Dr. Darliss Ridgel' NP Donnetta Hail information: [place] [address] [phone]                Discharge Instructions     ([place]) Call MD:  Anytime you have any of the following symptoms: 1) 3 pound weight gain in 24 hours or 5 pounds in 1 week 2) shortness of breath, with or without a dry hacking cough 3) swelling in the hands, feet or stomach 4) if you have to sleep on extra pillows at night in order to breathe.   Complete by: As directed    Amb Referral to Cardiac Rehabilitation   Complete by: As directed    Diagnosis: STEMI   After initial evaluation and assessments completed: Virtual Based Care may be provided alone or in conjunction with Phase 2 Cardiac Rehab based on patient barriers.: Yes   Call MD for:  difficulty breathing, headache or visual disturbances   Complete by: As directed    Call MD for:  persistant dizziness or light-headedness   Complete by: As directed    Call MD for:  redness, tenderness, or signs of infection (pain, swelling, redness, odor or green/yellow discharge around incision site)   Complete by: As directed    Diet - low sodium heart healthy   Complete by: As directed    Discharge instructions   Complete by: As directed    Radial Site Care Refer to this sheet in the next few weeks. These instructions provide you with information on caring for yourself after your procedure. Your caregiver may also give you more specific instructions. Your treatment has been planned according to current medical practices, but problems sometimes occur. Call your caregiver if you have any problems or questions after your procedure. HOME CARE INSTRUCTIONS You may shower the day after the procedure. Remove the bandage (dressing) and gently wash the site with plain soap and  water. Gently pat the site dry.  Do not apply powder or lotion to the site.  Do not submerge the affected site in water for 3 to 5 days.  Inspect the site at least twice daily.  Do not flex or bend the affected  arm for 24 hours.  No lifting over 5 pounds (2.3 kg) for 5 days after your procedure.  Do not drive home if you are discharged the same day of the procedure. Have someone else drive you.  You may drive 24 hours after the procedure unless otherwise instructed by your caregiver.  What to expect: Any bruising will usually fade within 1 to 2 weeks.  Blood that collects in the tissue (hematoma) may be painful to the touch. It should usually decrease in size and tenderness within 1 to 2 weeks.  SEEK IMMEDIATE MEDICAL CARE IF: You have unusual pain at the radial site.  You have redness, warmth, swelling, or pain at the radial site.  You have drainage (other than a small amount of blood on the dressing).  You have chills.  You have a fever or persistent symptoms for more than 72 hours.  You have a fever and your symptoms suddenly get worse.  Your arm becomes pale, cool, tingly, or numb.  You have heavy bleeding from the site. Hold pressure on the site.   PLEASE DO NOT MISS ANY DOSES OF YOUR PLAVIX!!!!! Also keep a log of you blood pressures and bring back to your follow up appt. Please call the office with any questions.   Patients taking blood thinners should generally stay away from medicines like ibuprofen, Advil, Motrin, naproxen, and Aleve due to risk of stomach bleeding. You may take Tylenol as directed or talk to your primary doctor about alternatives.   PLEASE ENSURE THAT YOU DO NOT RUN OUT OF YOUR BRILINTA/PLAVIX. IF you have issues obtaining this medication due to cost please CALL the office 3-5 business days prior to running out in order to prevent missing doses of this medication.   Increase activity slowly   Complete by: As directed        Discharge Medications    Allergies as of 06/13/2021       Reactions   Keflex [cephalexin] Other (See Comments)   "Locks my body up"        Medication List     STOP taking these medications    carvedilol 6.25 MG tablet Commonly known as: COREG   chlorproMAZINE 50 MG tablet Commonly known as: THORAZINE   Entresto 49-51 MG Generic drug: sacubitril-valsartan Replaced by: sacubitril-valsartan 24-26 MG       TAKE these medications    aspirin 81 MG EC tablet Take 1 tablet (81 mg total) by mouth daily.   clopidogrel 75 MG tablet Commonly known as: PLAVIX Take 1 tablet (75 mg total) by mouth daily.   empagliflozin 10 MG Tabs tablet Commonly known as: JARDIANCE Take 1 tablet (10 mg total) by mouth daily. Start taking on: June 14, 2021   nitroGLYCERIN 0.4 MG SL tablet Commonly known as: NITROSTAT Place 1 tablet (0.4 mg total) under the tongue every 5 (five) minutes as needed for chest pain (CP or SOB).   rosuvastatin 40 MG tablet Commonly known as: Crestor Take 1 tablet (40 mg total) by mouth daily.   sacubitril-valsartan 24-26 MG Commonly known as: ENTRESTO Take 1 tablet by mouth 2 (two) times daily. Replaces: Entresto 49-51 MG          Outstanding Labs/Studies   BMET at follow up appt.   Duration of Discharge Encounter   Greater than 30 minutes including physician time.  Signed, Reino Bellis, NP 06/13/2021, 9:53 AM  I have examined the patient and reviewed assessment and plan and discussed with patient.  Agree  with above as stated.   GEN: Well nourished, well developed, in no acute distress  HEENT: normal  Neck: no JVD, carotid bruits, or masses Cardiac: RRR; no murmurs, rubs, or gallops,no edema  Respiratory:  clear to auscultation bilaterally, normal work of breathing GI: soft, nontender, nondistended,  MS: no deformity or atrophy ; no hematoma at the right wrist;  Skin: warm and dry, no rash Neuro:  Strength and sensation are intact Psych: euthymic mood, full  affect   Medical therapy for ACS and heart failure/  Avoid cocaine.   OK for discharge later today.   Larae Grooms

## 2021-06-13 NOTE — Progress Notes (Signed)
CARDIAC REHAB PHASE I   Offered to walk with pt. Pt states independent ambulation without difficulty. Denies CP, SOB, or dizziness. Reinforced importance of medication, site care, restrictions, and exercise guidelines. Referred to CRP II [place].   [id number] Reynold Bowen, RN BSN 06/13/2021 10:18 AM

## 2021-06-23 ENCOUNTER — Other Ambulatory Visit: Payer: Self-pay

## 2021-06-23 ENCOUNTER — Encounter (HOSPITAL_BASED_OUTPATIENT_CLINIC_OR_DEPARTMENT_OTHER): Payer: Self-pay | Admitting: Nurse Practitioner

## 2021-06-23 ENCOUNTER — Other Ambulatory Visit (HOSPITAL_COMMUNITY): Payer: Self-pay

## 2021-06-23 ENCOUNTER — Telehealth (HOSPITAL_COMMUNITY): Payer: Self-pay

## 2021-06-23 ENCOUNTER — Ambulatory Visit (INDEPENDENT_AMBULATORY_CARE_PROVIDER_SITE_OTHER): Payer: Self-pay | Admitting: Nurse Practitioner

## 2021-06-23 VITALS — BP 132/86 | HR 48 | Ht 66.0 in | Wt 183.3 lb

## 2021-06-23 DIAGNOSIS — E785 Hyperlipidemia, unspecified: Secondary | ICD-10-CM

## 2021-06-23 DIAGNOSIS — I5041 Acute combined systolic (congestive) and diastolic (congestive) heart failure: Secondary | ICD-10-CM

## 2021-06-23 DIAGNOSIS — I2510 Atherosclerotic heart disease of native coronary artery without angina pectoris: Secondary | ICD-10-CM

## 2021-06-23 DIAGNOSIS — F17200 Nicotine dependence, unspecified, uncomplicated: Secondary | ICD-10-CM

## 2021-06-23 DIAGNOSIS — I10 Essential (primary) hypertension: Secondary | ICD-10-CM

## 2021-06-23 MED ORDER — NICOTINE 21 MG/24HR TD PT24
21.0000 mg | MEDICATED_PATCH | Freq: Every day | TRANSDERMAL | 0 refills | Status: DC
Start: 2021-06-23 — End: 2021-07-14
  Filled 2021-06-23: qty 28, 28d supply, fill #0

## 2021-06-23 MED ORDER — NICOTINE 7 MG/24HR TD PT24
7.0000 mg | MEDICATED_PATCH | Freq: Every day | TRANSDERMAL | 0 refills | Status: DC
  Filled 2021-06-23: qty 28, 28d supply, fill #0

## 2021-06-23 MED ORDER — NICOTINE 14 MG/24HR TD PT24
14.0000 mg | MEDICATED_PATCH | Freq: Every day | TRANSDERMAL | 0 refills | Status: AC
Start: 2021-06-23 — End: ?
  Filled 2021-06-23: qty 28, 28d supply, fill #0

## 2021-06-23 MED ORDER — FUROSEMIDE 20 MG PO TABS
20.0000 mg | ORAL_TABLET | ORAL | 3 refills | Status: DC | PRN
  Filled 2021-06-23: qty 90, 90d supply, fill #0

## 2021-06-23 NOTE — Patient Instructions (Signed)
Medication Instructions:  Your physician has recommended you make the following change in your medication:   Start: Lasix 20mg  as needed for weight gain of 2 pounds overnight or 5 pounds in a week or swelling   Start: Nicotine Patch-   Month 1: 21mg  patch daily   Month 2: 14mg  patch daily   Month 3: 7mg  patch daily  *If you need a refill on your cardiac medications before your next appointment, please call your pharmacy*   Lab Work: None ordered   Testing/Procedures: None ordered    Follow-Up: At [facility], you and your health needs are our priority.  As part of our continuing mission to provide you with exceptional heart care, we have created designated Provider Care Teams.  These Care Teams include your primary Cardiologist (physician) and Advanced Practice Providers (APPs -  Physician Assistants and Nurse Practitioners) who all work together to provide you with the care you need, when you need it.  We recommend signing up for the patient portal called "MyChart".  Sign up information is provided on this After Visit Summary.  MyChart is used to connect with patients for Virtual Visits (Telemedicine).  Patients are able to view lab/test results, encounter notes, upcoming appointments, etc.  Non-urgent messages can be sent to your provider as well.   To learn more about what you can do with MyChart, go to .    Your next appointment:   2 month(s)  The format for your next appointment:   In Person  Provider:   , NP   Other Instructions:  Smoking Cessation: 1-800- QUITNOW   Recommend weighing daily and keeping a log. Please call our office if you have weight gain of 2 pounds overnight or 5 pounds in 1 week.   Date  Time Weight

## 2021-06-23 NOTE — Telephone Encounter (Signed)
Transitions of Care Pharmacy   Call attempted for a pharmacy transitions of care follow-up. Unable to leave voicemail.  Call attempt #1. Will follow-up in 2-3 days.    

## 2021-06-23 NOTE — Progress Notes (Signed)
Cardiology Office Note:    Date:  06/23/2021   ID:  Christopher Gibson, DOB June 11, 1970, MRN [id number]  PCP:  Ladell Pier, MD   [place] [facility] Cardiologist:  Sinclair Grooms, MD     Referring MD: No ref. provider found   Chief Complaint: hospital f/u CHF, CAD  History of Present Illness:    Christopher Gibson is a 51 y.o. male with a hx of CAD s/p STEMI, chronic combined systolic and diastolic CHF, hyperlipidemia, and polysubstance abuse.   He presented to the [facility] ED on 10/12/20 with substernal chest pressure radiating to left shoulder that had persisted all day. He admitted to cocaine use just prior to having chest pain. He denied prior cardiac history. Hs troponins very high at 2817 ? > 24000. EKG initially revealed ST changes but no STEMI @ 2336; repeat EKG @ 2341 revealed dynamic changes consistent with early STEMI. EKGs were reviewed with cardiology and patient was taken to the cath lab. Cardiac catheterization revealed late presenting anterior infarction due to occlusion of mid LAD with successful stenting of mid LAD, widely patent left main, diffuse luminal irregularities in the proximal and mid LAD, diffuse mild to moderate nonobstructive atherosclerosis in the circumflex, diffuse moderate to severe nonobstructive atherosclerosis in the proximal to distal RCA, apical dyskinesis probable layered apical thrombus.  EF 30 to 35%, LVEDP 24 mmHg acute systolic heart failure based upon hemodynamics and wall motion.  Recommendation for aggressive risk factor modification with high intensity statin, blood pressure control, uninterrupted DAPT with Brilinta and aspirin. LVEF by echo on 10/13/20 was 42%, mild LVH, G1DD, no rwma.   He was hospitalized 11/10-11/12/22 and cardiology was asked to consult for EKG originally read out as STEMI but was later discovered to have TWI similar to previous EKGs. He admitted to continued use of cocaine. Hs troponin only mildly elevated at 10 ?20.  No  adverse arrhythmias on telemetry. Hs troponin was elevated at > 4500. CTA of the chest showed no aneurysm and no dissection.  Echo revealed EF 45 to 50%, mid segments hypokinetic, no significant change from 10/13/2020.  LV thrombus appeared to have resolved.    He has previously not maintained compliance with routine cardiology follow-up or with medications. Prior history of elevated transaminases - held statin  On 06/08/21, he presented to [facility] ED for evaluation of chest pain present for a few hours.  Diaphoresis, nausea and vomiting.  He denied using alcohol or drugs for the previous 2 months.  Was out of Plavix for at least 5 days.  EKG revealed SR with anterior ST elevations with associated Q waves. Medical therapy for MI was initiated and cardiac cath was planned, however he became hypotensive and cath was delayed. Cardiac cath on 2/3 revealed chronically occluded LAD stent with mild to moderate disease elsewhere, severely elevated LVEDP felt to be the cause of patient's elevated troponin, severe left ventricular dysfunction with EF approximately 25 to 30%.  Mentations for goal-directed medical therapy for acute on chronic systolic heart failure.   Today, he is here alone for follow-up. States he is living with his sister and has family who provide transportation to appointment.  He reports that the chest pain that he felt when he arrived at the hospital on 2/1 improved prior to discharge and he has had no further chest pain.  Admits to shortness of breath, no edema, no pnd, no orthopnea.  Has not been very active since hospital discharge.  States he  is not using illicit drugs and is rarely drinking liquor or beer.  Continues to use tobacco but is trying to quit.  He denies problems with getting his current medications.   Past Medical History:  Diagnosis Date   CAD (coronary artery disease)    Chronic combined systolic and diastolic CHF (congestive heart failure) (HCC)    Cocaine abuse (HCC)    Heroin  abuse ([place])    HTN (hypertension)    S/P angioplasty with stent 10/13/20 DES to mLAD    STEMI (ST elevation myocardial infarction) ([place]) 10/13/2020    Past Surgical History:  Procedure Laterality Date   CORONARY/GRAFT ACUTE MI REVASCULARIZATION N/A 10/13/2020   Procedure: Coronary/Graft Acute MI Revascularization;  Surgeon: Belva Crome, MD;  Location: [place] [facility];  Service: Cardiovascular;  Laterality: N/A;   LEFT HEART CATH AND CORONARY ANGIOGRAPHY N/A 10/13/2020   Procedure: LEFT HEART CATH AND CORONARY ANGIOGRAPHY;  Surgeon: Belva Crome, MD;  Location: [place] [facility];  Service: Cardiovascular;  Laterality: N/A;   LEFT HEART CATH AND CORONARY ANGIOGRAPHY N/A 06/08/2021   Procedure: LEFT HEART CATH AND CORONARY ANGIOGRAPHY;  Surgeon: Early Osmond, MD;  Location: [place] [facility];  Service: Cardiovascular;  Laterality: N/A;    Current Medications: Current Meds  Medication Sig   aspirin 81 MG EC tablet Take 1 tablet (81 mg total) by mouth daily.   clopidogrel (PLAVIX) 75 MG tablet Take 1 tablet (75 mg total) by mouth daily.   empagliflozin (JARDIANCE) 10 MG TABS tablet Take 1 tablet (10 mg total) by mouth daily.   furosemide (LASIX) 20 MG tablet Take 1 tablet (20 mg total) by mouth as needed for fluid or edema (WEIGHT GAIN OF 2 POUNDS OVERNIGHT OR 5 POUNDS IN A WEEK).   nicotine (NICODERM CQ - DOSED IN MG/24 HOURS) 14 mg/24hr patch Place 1 patch (14 mg total) onto the skin daily. For the 2nd month   nicotine (NICODERM CQ - DOSED IN MG/24 HOURS) 21 mg/24hr patch Place 1 patch (21 mg total) onto the skin daily. For the first month   nicotine (NICODERM CQ - DOSED IN MG/24 HR) 7 mg/24hr patch Place 1 patch (7 mg total) onto the skin daily. For the 3rd month   nitroGLYCERIN (NITROSTAT) 0.4 MG SL tablet Place 1 tablet (0.4 mg total) under the tongue every 5 (five) minutes as needed for chest pain (CP or SOB).   rosuvastatin (CRESTOR) 40 MG tablet Take 1 tablet (40 mg total) by  mouth daily.   sacubitril-valsartan (ENTRESTO) 24-26 MG Take 1 tablet by mouth 2 (two) times daily.     Allergies:   Keflex [cephalexin]   Social History   Socioeconomic History   Marital status: Single    Spouse name: Not on file   Number of children: Not on file   Years of education: Not on file   Highest education level: Not on file  Occupational History   Not on file  Tobacco Use   Smoking status: Every Day   Smokeless tobacco: Never  Substance and Sexual Activity   Alcohol use: Not Currently   Drug use: Yes    Types: Cocaine, Heroin   Sexual activity: Not on file  Other Topics Concern   Not on file  Social History Narrative   Not on file   Social Determinants of Health   Financial Resource Strain: High Risk   Difficulty of Paying Living Expenses: Hard  Food Insecurity: Food Insecurity Present  Worried About Charity fundraiser in the Last Year: Sometimes true   [employer] of Food in the Last Year: Sometimes true  Transportation Needs: No Transportation Needs   Lack of Transportation (Medical): No   Lack of Transportation (Non-Medical): No  Physical Activity: Not on file  Stress: Not on file  Social Connections: Not on file     Family History: The patient's family history is negative for Coronary artery disease.  ROS:   Please see the history of present illness.   + shortness of breath All other systems reviewed and are negative.  Labs/Other Studies Reviewed:    The following studies were reviewed today:  Echo 06/08/21  Left Ventricle: Left ventricular ejection fraction, by estimation, is 35  to 40%. The left ventricle has moderately decreased function. The left  ventricle demonstrates regional wall motion abnormalities. Definity  contrast agent was given IV to delineate the left ventricular endocardial borders. The left ventricular internal  cavity size was normal in size. There is mild concentric left ventricular  hypertrophy. Left ventricular  diastolic parameters are consistent with  Grade I diastolic dysfunction (impaired relaxation).    LV Wall Scoring:  The mid and distal anterior wall, anterior septum, apical inferior  segment, basal inferoseptal segment, and apex are akinetic. The antero-lateral  wall, inferior wall, basal inferolateral segment, apical lateral segment, mid  inferoseptal segment, apical septal segment, and basal anterior segment  are hypokinetic.  Right Ventricle: The right ventricular size is normal. No increase in  right ventricular wall thickness. Right ventricular systolic function is  normal.  Left Atrium: Left atrial size was normal in size.  Right Atrium: Right atrial size was normal in size.  Pericardium: There is no evidence of pericardial effusion.  Mitral Valve: The mitral valve is normal in structure. No evidence of  mitral valve regurgitation. No evidence of mitral valve stenosis.  Tricuspid Valve: The tricuspid valve is normal in structure. Tricuspid  valve regurgitation is not demonstrated. No evidence of tricuspid  stenosis.  Aortic Valve: The aortic valve is normal in structure. Aortic valve  regurgitation is not visualized. No aortic stenosis is present.  Pulmonic Valve: The pulmonic valve was normal in structure. Pulmonic valve  regurgitation is not visualized. No evidence of pulmonic stenosis.  Aorta: The aortic root is normal in size and structure.  Venous: The inferior vena cava is normal in size with greater than 50%  respiratory variability, suggesting right atrial pressure of 3 mmHg.  IAS/Shunts: No atrial level shunt detected by color flow Doppler.    LHC 06/10/21    Mid LAD lesion is 100% stenosed.   Prox RCA to Dist RCA lesion is 50% stenosed.   1st Mrg lesion is 60% stenosed.   LV end diastolic pressure is severely elevated.   The left ventricular ejection fraction is 25-35% by visual estimate.   Chronically occluded mid LAD stent (as evidenced by lack of dye staining,  difficulty crossing the occlusion with a wire, and inability to pass a balloon) with mild to moderate disease elsewhere.  Severely elevated LVEDP and this is likely the reason for the patient's elevated troponin.  20 mg of Lasix IV was administered in the cardiac catheterization laboratory and will be continued.  Goal-directed medical therapy will be pursued. 3.  Severe left ventricular dysfunction with ejection fraction approximately 25 to 30%.   Recommendations: Goal-directed medical therapy for acute on chronic systolic heart failure.    LHC 10/13/20  A stent was successfully placed.  Late presenting anterior infarction due to occlusion of the mid LAD. Successful stenting of the mid LAD using an 18 x 2.5 Onyx postdilated to 3.5 mm in diameter with TIMI grade III flow. Widely patent left main Diffuse luminal irregularities in the proximal and mid LAD Diffuse mild to moderate nonobstructive atherosclerosis in the circumflex. 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  EF 30 to 35%.  LVEDP 24 mmHg. Acute systolic heart failure based upon hemodynamics and wall motion.   RECOMMENDATIONS:   Aggressive risk factor modification with high intensity statin therapy, screening for diabetes, blood pressure control. IV nitroglycerin for blood pressure control and to decrease LVEDP.  She did run for 12 to 24 hours Start ARB and beta-blocker therapy as tolerated by blood pressure and clinical status 2D Doppler echocardiogram to assess for apical thrombus. IV heparin will be restarted because of high index of suspicion of apical thrombus. Further management per team   Recent Labs: 10/13/2020: B Natriuretic Peptide 139.1 06/08/2021: ALT 18; Magnesium 2.0 06/10/2021: Hemoglobin 13.8; Platelets 239 06/13/2021: BUN 16; Creatinine, Ser 0.88; Potassium 5.1; Sodium 133   Recent Lipid Panel    Component Value  Date/Time   CHOL 167 06/08/2021 0514   TRIG 92 06/08/2021 0514   HDL 38 (L) 06/08/2021 0514   CHOLHDL 4.4 06/08/2021 0514   VLDL 18 06/08/2021 0514   LDLCALC 111 (H) 06/08/2021 0514     Risk Assessment/Calculations:      Physical Exam:    VS:  BP 132/86    Pulse (!) 48    Ht 5\' 6"  (1.676 m)    Wt 183 lb 4.8 oz (83.1 kg)    BMI 29.59 kg/m     Wt Readings from Last 3 Encounters:  06/23/21 183 lb 4.8 oz (83.1 kg)  06/13/21 182 lb 5.1 oz (82.7 kg)  05/02/21 176 lb (79.8 kg)     GEN:  Well nourished, well developed in no acute distress HEENT: Normal NECK: No JVD; No carotid bruits CARDIAC: RRR, no murmurs, rubs, gallops RESPIRATORY:  Clear to auscultation without rales, wheezing or rhonchi  ABDOMEN: Soft, non-tender, non-distended MUSCULOSKELETAL:  No edema; No deformity. 2+ pedal pulses, equal bilaterally SKIN: Warm and dry. Right radial cath site well healed without swelling or discoloration NEUROLOGIC:  Alert and oriented x 3 PSYCHIATRIC:  Normal affect   EKG:  EKG is not ordered today.    Diagnoses:    1. Acute combined systolic and diastolic HF (heart failure), NYHA class 3 ([place])   2. Coronary artery disease involving native coronary artery of native heart without angina pectoris   3. Essential hypertension   4. Hyperlipidemia LDL goal <70   5. Tobacco use disorder    Assessment and Plan:     Acute on chronic combined CHF: LVEF 35-40%, G1DD by echo 2/1. GDMT was limited by hypotension during hospitalization.  Blood pressure improved today.  Heart rate remains low at 48 bpm.  Will continue to hold on adding beta-blocker. Has shortness of breath, no orthopnea, no PND, no edema. He appears euvolemic today. Encouraged him to monitor daily weight and prn Lasix given for weight gain > 2 lbs in 24 hours and 5 lbs or greater in 1 week. Continue Ferne Coe. Would favor up titration of Entresto or addition of MRA at next office visit if BP allows.   CAD without  angina: Prior stenting of the LAD with reocclusion in the setting  of noncompliance with medications and cocaine use. Unknown successful PCI of LAD, complete did infarct. He denies chest pain. Has dyspnea that is felt to be related to CHF. No bleeding concerns on aspirin and Plavix.  Continue GDMT including aspirin, Plavix, statin, Entresto.   Essential hypertension: Blood pressure well controlled today.  He does not monitor blood pressure at home. Continue Entresto.   Tobacco use disorder: Information given for 1 800 QUITNOW.  Nicotine patches prescribed and instructions given. Complete cessation advised.  Polysubstance use disorder: Reports he is not currently using illicit drugs.  His drug panel at time of admission 2/1 was positive for cocaine. Assistance offered for counseling for which he politely declined. As noted above, agreeable to smoking cessation offerings.   Hyperlipidemia LDL goal < 70: Previously reported intolerance of statin.  Trial of high-dose Crestor started.  We will get repeat LFTs and lipid panel in 8 weeks.    Disposition: 2 mo f/u with me with fasting labs prior    Cardiac Rehabilitation Eligibility Assessment        Medication Adjustments/Labs and Tests Ordered: Current medicines are reviewed at length with the patient today.  Concerns regarding medicines are outlined above.  No orders of the defined types were placed in this encounter.  Meds ordered this encounter  Medications   nicotine (NICODERM CQ - DOSED IN MG/24 HOURS) 21 mg/24hr patch    Sig: Place 1 patch (21 mg total) onto the skin daily. For the first month    Dispense:  28 patch    Refill:  0    Order Specific Question:   Supervising Provider    Answer:   Acie Fredrickson, PHILIP J [[id number]]   nicotine (NICODERM CQ - DOSED IN MG/24 HOURS) 14 mg/24hr patch    Sig: Place 1 patch (14 mg total) onto the skin daily. For the 2nd month    Dispense:  28 patch    Refill:  0    Order Specific Question:   Supervising  Provider    Answer:   Acie Fredrickson, PHILIP J [[id number]]   nicotine (NICODERM CQ - DOSED IN MG/24 HR) 7 mg/24hr patch    Sig: Place 1 patch (7 mg total) onto the skin daily. For the 3rd month    Dispense:  28 patch    Refill:  0    Order Specific Question:   Supervising Provider    Answer:   Thayer Headings [[id number]]   furosemide (LASIX) 20 MG tablet    Sig: Take 1 tablet (20 mg total) by mouth as needed for fluid or edema (WEIGHT GAIN OF 2 POUNDS OVERNIGHT OR 5 POUNDS IN A WEEK).    Dispense:  90 tablet    Refill:  3    Patient Instructions  Medication Instructions:  Your physician has recommended you make the following change in your medication:   Start: Lasix 20mg  as needed for weight gain of 2 pounds overnight or 5 pounds in a week or swelling   Start: Nicotine Patch-   Month 1: 21mg  patch daily   Month 2: 14mg  patch daily   Month 3: 7mg  patch daily  *If you need a refill on your cardiac medications before your next appointment, please call your pharmacy*   Lab Work: None ordered   Testing/Procedures: None ordered    Follow-Up: At [facility], you and your health needs are our priority.  As part of our continuing mission to provide you with exceptional heart care, we have created designated Provider  Care Teams.  These Care Teams include your primary Cardiologist (physician) and Advanced Practice Providers (APPs -  Physician Assistants and Nurse Practitioners) who all work together to provide you with the care you need, when you need it.  We recommend signing up for the patient portal called "MyChart".  Sign up information is provided on this After Visit Summary.  MyChart is used to connect with patients for Virtual Visits (Telemedicine).  Patients are able to view lab/test results, encounter notes, upcoming appointments, etc.  Non-urgent messages can be sent to your provider as well.   To learn more about what you can do with MyChart, go to [email or web address].    Your next  appointment:   2 month(s)  The format for your next appointment:   In Person  Provider:   Christen Bame, NP   Other Instructions:  Smoking Cessation: 1-800- QUITNOW   Recommend weighing daily and keeping a log. Please call our office if you have weight gain of 2 pounds overnight or 5 pounds in 1 week.   Date  Time Weight                                               Signed, Emmaline Life, NP  06/23/2021 4:18 PM    [place]

## 2021-06-24 ENCOUNTER — Telehealth (HOSPITAL_COMMUNITY): Payer: Self-pay

## 2021-06-24 NOTE — Telephone Encounter (Signed)
Transitions of Care Pharmacy   Call attempted for a pharmacy transitions of care follow-up. Unable to leave voicemail.   Call attempt #2. Will follow-up in 2-3 days.    

## 2021-06-27 ENCOUNTER — Telehealth (HOSPITAL_COMMUNITY): Payer: Self-pay

## 2021-06-27 NOTE — Telephone Encounter (Signed)
Transitions of Care Pharmacy  ° °Call attempted for a pharmacy transitions of care follow-up. HIPAA appropriate voicemail was left with call back information provided.  ° °Call attempt #3. Will no longer contact patient °  °

## 2021-07-01 ENCOUNTER — Other Ambulatory Visit (HOSPITAL_COMMUNITY): Payer: Self-pay

## 2021-07-14 ENCOUNTER — Other Ambulatory Visit: Payer: Self-pay

## 2021-07-14 ENCOUNTER — Ambulatory Visit: Payer: Self-pay | Attending: Internal Medicine | Admitting: Internal Medicine

## 2021-07-14 ENCOUNTER — Other Ambulatory Visit (HOSPITAL_COMMUNITY): Payer: Self-pay

## 2021-07-14 VITALS — BP 129/86 | HR 80 | Ht 66.0 in | Wt 187.8 lb

## 2021-07-14 DIAGNOSIS — F17200 Nicotine dependence, unspecified, uncomplicated: Secondary | ICD-10-CM

## 2021-07-14 DIAGNOSIS — Z2821 Immunization not carried out because of patient refusal: Secondary | ICD-10-CM

## 2021-07-14 DIAGNOSIS — I2510 Atherosclerotic heart disease of native coronary artery without angina pectoris: Secondary | ICD-10-CM

## 2021-07-14 DIAGNOSIS — F1910 Other psychoactive substance abuse, uncomplicated: Secondary | ICD-10-CM | POA: Insufficient documentation

## 2021-07-14 DIAGNOSIS — I10 Essential (primary) hypertension: Secondary | ICD-10-CM

## 2021-07-14 DIAGNOSIS — I5042 Chronic combined systolic (congestive) and diastolic (congestive) heart failure: Secondary | ICD-10-CM

## 2021-07-14 MED ORDER — ASPIRIN 81 MG PO TBEC
81.0000 mg | DELAYED_RELEASE_TABLET | Freq: Every day | ORAL | 1 refills | Status: AC
  Filled 2021-07-14: qty 90, 90d supply, fill #0

## 2021-07-14 MED ORDER — FUROSEMIDE 20 MG PO TABS
20.0000 mg | ORAL_TABLET | ORAL | 3 refills | Status: DC | PRN
  Filled 2021-07-14: qty 90, 90d supply, fill #0

## 2021-07-14 MED ORDER — EMPAGLIFLOZIN 10 MG PO TABS
10.0000 mg | ORAL_TABLET | Freq: Every day | ORAL | 1 refills | Status: AC
  Filled 2021-07-14 – 2021-09-09 (×2): qty 90, 90d supply, fill #0

## 2021-07-14 MED ORDER — NICOTINE 21 MG/24HR TD PT24
21.0000 mg | MEDICATED_PATCH | Freq: Every day | TRANSDERMAL | 0 refills | Status: DC
  Filled 2021-07-14: qty 28, 28d supply, fill #0

## 2021-07-14 MED ORDER — ROSUVASTATIN CALCIUM 40 MG PO TABS
40.0000 mg | ORAL_TABLET | Freq: Every day | ORAL | 1 refills | Status: AC
Start: 2021-07-14 — End: ?
  Filled 2021-07-14 – 2021-09-09 (×2): qty 90, 90d supply, fill #0

## 2021-07-14 MED ORDER — CLOPIDOGREL BISULFATE 75 MG PO TABS
75.0000 mg | ORAL_TABLET | Freq: Every day | ORAL | 1 refills | Status: DC
  Filled 2021-07-14 – 2021-09-09 (×2): qty 90, 90d supply, fill #0

## 2021-07-14 MED ORDER — SACUBITRIL-VALSARTAN 24-26 MG PO TABS
1.0000 | ORAL_TABLET | Freq: Two times a day (BID) | ORAL | 4 refills | Status: DC
  Filled 2021-07-14: qty 60, 30d supply, fill #0
  Filled 2021-08-15: qty 60, 30d supply, fill #1
  Filled 2021-09-09: qty 60, 30d supply, fill #2

## 2021-07-14 NOTE — Progress Notes (Signed)
Patient ID: Christopher Gibson, male    DOB: [birth year]  MRN: [id number]  CC: hospital followup (Pt was in the hospital on 06/13/2021, had strent in his heart, pt needs a refill on his medication on his entresto)   Subjective: Christopher Gibson is a 51 y.o. 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 Patient hospitalized 2/1-10/2021 with chest pain after running out of his medications including DAPT.  Found to have anterior STEMI.  Cath revealed reocclusion of LAD that was previously stented.  Unsuccessful PCI of LAD, completed infarct.  He was for medical management.  Placed on DAPT with aspirin and Plavix, statin therapy, Entresto.  No BP due to soft blood pressure.  Echo revealed EF of 35 to 40%.  Jardiance added.  LDL 111.  Restarted on Crestor 40 mg daily.  UDS positive for cocaine and marijuana.  Advised to quit.  Today: CAD/HTN/CHF: Saw cardiology NP 2/16.  Started on furosemide to use as needed. Denies chest pain/SOB/LE since hospital discharge. No use of SL nitro. Patient reports he has not picked up the furosemide as yet because he lacked transportation to the pharmacy. Reports compliance with medications including Crestor, aspirin, Plavix, Jardiance, Entresto. Still smoking but trying to quit.  He was prescribed the nicotine patches but has not picked them up as yet again due to transportation issues.  He plans to get today.  Currently smoking 1/3 PK/day  History of substance abuse: Reports he has not used cocaine since hospitalization.  Still uses marijuana regularly.  Endorses use of heroin to "give me energy and appetite."  Last used about 3 days ago.  Reports being in a methadone treatment program a few years ago for a few months but then he  stopped going.  He would be interested in getting into a Suboxone program.  Patient Active Problem List   Diagnosis Date Noted   Hyperlipidemia 06/13/2021   ACS (acute coronary syndrome) (HCC) 06/08/2021   Hypertensive urgency 03/18/2021   CAD (coronary artery disease), native coronary artery 03/17/2021   Chest pain 03/17/2021   Cocaine abuse (HCC) 03/17/2021   Chronic combined systolic and diastolic CHF (congestive heart failure) (HCC) 03/17/2021   Heroin abuse (HCC) 03/17/2021   HTN (hypertension) 03/17/2021   Acute ST elevation myocardial infarction (STEMI) due to occlusion of distal portion of left anterior descending (LAD) coronary artery (HCC) 10/13/2020   ST elevation myocardial infarction (STEMI) (HCC)    Coronary artery disease involving native coronary artery of native heart with unstable angina pectoris (HCC)    Acute combined systolic and diastolic HF (heart failure), NYHA class 3 (HCC)      Current Outpatient Medications on File Prior to Visit  Medication Sig Dispense Refill   nicotine (NICODERM CQ - DOSED IN MG/24 HR) 7 mg/24hr patch Place 1 patch (7 mg total) onto the skin daily. For the 3rd month 28 patch 0   nitroGLYCERIN (NITROSTAT) 0.4 MG SL tablet Place 1 tablet (0.4 mg total) under the tongue every 5 (five) minutes as needed for chest pain (CP or SOB). 25 tablet 2   nicotine (NICODERM CQ - DOSED IN MG/24 HOURS) 14 mg/24hr patch Place 1 patch (14 mg total) onto the skin daily. For the 2nd month (Patient not taking: Reported on 07/14/2021)  28 patch 0   No current facility-administered medications on file prior to visit.    Allergies  Allergen Reactions   Keflex [Cephalexin] Other (See Comments)    "Locks my body up"    Social History   Socioeconomic History   Marital status: Single    Spouse name: Not on file   Number of children: Not on file   Years of education: Not on file   Highest education level: Not on file  Occupational History   Not on file   Tobacco Use   Smoking status: Every Day   Smokeless tobacco: Never  Substance and Sexual Activity   Alcohol use: Not Currently   Drug use: Yes    Types: Cocaine, Heroin   Sexual activity: Not on file  Other Topics Concern   Not on file  Social History Narrative   Not on file   Social Determinants of Health   Financial Resource Strain: High Risk   Difficulty of Paying Living Expenses: Hard  Food Insecurity: Food Insecurity Present   Worried About Running Out of Food in the Last Year: Sometimes true   Ran Out of Food in the Last Year: Sometimes true  Transportation Needs: No Transportation Needs   Lack of Transportation (Medical): No   Lack of Transportation (Non-Medical): No  Physical Activity: Not on file  Stress: Not on file  Social Connections: Not on file  Intimate Partner Violence: Not on file    Family History  Problem Relation Age of Onset   Coronary artery disease Neg Hx     Past Surgical History:  Procedure Laterality Date   CORONARY/GRAFT ACUTE MI REVASCULARIZATION N/A 10/13/2020   Procedure: Coronary/Graft Acute MI Revascularization;  Surgeon: Lyn RecordsSmith, Henry W, MD;  Location: [facility];  Service: Cardiovascular;  Laterality: N/A;   LEFT HEART CATH AND CORONARY ANGIOGRAPHY N/A 10/13/2020   Procedure: LEFT HEART CATH AND CORONARY ANGIOGRAPHY;  Surgeon: Lyn RecordsSmith, Henry W, MD;  Location: [facility];  Service: Cardiovascular;  Laterality: N/A;   LEFT HEART CATH AND CORONARY ANGIOGRAPHY N/A 06/08/2021   Procedure: LEFT HEART CATH AND CORONARY ANGIOGRAPHY;  Surgeon: Orbie Pyohukkani, Arun K, MD;  Location: [facility];  Service: Cardiovascular;  Laterality: N/A;    ROS: Review of Systems Negative except as stated above  PHYSICAL EXAM: BP 129/86    Pulse 80    Ht 5\' 6"  (1.676 m)    Wt 187 lb 12.8 oz (85.2 kg)    SpO2 97%    BMI 30.31 kg/m   Physical Exam   General appearance - alert, well appearing, and in no distress Mental status -patient is very  fidgety with constant shaking of the legs.  Noted to have runny nose. Eyes - pupils equal and reactive, extraocular eye movements intact Neck - supple, no significant adenopathy Chest - clear to auscultation, no wheezes, rales or rhonchi, symmetric air entry Heart - normal rate, regular rhythm, normal S1, S2, no murmurs, rubs, clicks or gallops Extremities - trace BL LE edema  CMP Latest Ref Rng & Units 06/13/2021 06/11/2021 06/10/2021  Glucose 70 - 99 mg/dL 409(W100(H) 97 99  BUN 6 - 20 mg/dL 16 11(B23(H) 14(N26(H)  Creatinine 0.61 - 1.24 mg/dL 8.290.88 5.621.07 1.30(Q1.65(H)  Sodium 135 - 145 mmol/L 133(L) 134(L) 132(L)  Potassium 3.5 - 5.1 mmol/L 5.1 4.4 3.5  Chloride 98 - 111 mmol/L 101 103 96(L)  CO2 22 - 32 mmol/L 23 24 24   Calcium 8.9 - 10.3 mg/dL 9.7  9.2 9.0  Total Protein 6.5 - 8.1 g/dL - - -  Total Bilirubin 0.3 - 1.2 mg/dL - - -  Alkaline Phos 38 - 126 U/L - - -  AST 15 - 41 U/L - - -  ALT 0 - 44 U/L - - -   Lipid Panel     Component Value Date/Time   CHOL 167 06/08/2021 0514   TRIG 92 06/08/2021 0514   HDL 38 (L) 06/08/2021 0514   CHOLHDL 4.4 06/08/2021 0514   VLDL 18 06/08/2021 0514   LDLCALC 111 (H) 06/08/2021 0514    CBC    Component Value Date/Time   WBC 5.5 06/10/2021 0258   RBC 4.47 06/10/2021 0258   HGB 13.8 06/10/2021 0258   HCT 40.0 06/10/2021 0258   PLT 239 06/10/2021 0258   MCV 89.5 06/10/2021 0258   MCH 30.9 06/10/2021 0258   MCHC 34.5 06/10/2021 0258   RDW 12.1 06/10/2021 0258   LYMPHSABS 0.8 06/08/2021 0512   MONOABS 0.5 06/08/2021 0512   EOSABS 0.0 06/08/2021 0512   BASOSABS 0.0 06/08/2021 0512    ASSESSMENT AND PLAN:  1. Coronary artery disease involving native coronary artery of native heart without angina pectoris Patient to continue statin, DAPT, He wants to get his medicines through our pharmacy.  We will give 90-day supply at a time to help prevent him from running out of medications between visits. - rosuvastatin (CRESTOR) 40 MG tablet; Take 1 tablet (40 mg  total) by mouth daily.  Dispense: 90 tablet; Refill: 1 - clopidogrel (PLAVIX) 75 MG tablet; Take 1 tablet (75 mg total) by mouth daily.  Dispense: 90 tablet; Refill: 1 - aspirin 81 MG EC tablet; Take 1 tablet (81 mg total) by mouth daily.  Dispense: 90 tablet; Refill: 1  2. Essential hypertension Close to goal. To add BB on future visit  3. Chronic combined systolic and diastolic CHF (congestive heart failure) (HCC) Continue use of  guideline directed therapy. - sacubitril-valsartan (ENTRESTO) 24-26 MG; Take 1 tablet by mouth 2 (two) times daily.  Dispense: 60 tablet; Refill: 4 - empagliflozin (JARDIANCE) 10 MG TABS tablet; Take 1 tablet (10 mg total) by mouth daily.  Dispense: 90 tablet; Refill: 1 - furosemide (LASIX) 20 MG tablet; Take 1 tablet (20 mg total) by mouth as needed for fluid or edema (WEIGHT GAIN OF 2 POUNDS OVERNIGHT OR 5 POUNDS IN A WEEK).  Dispense: 90 tablet; Refill: 3  4. Tobacco dependence Advised to quit.  Discussed health risks associated with smoking.  Patient ready to try to quit.  He wants to use the nicotine patches.  I went over with him how to use the nicotine patches.  We will do the stepdown approach starting with 21 mg patch.  Reassess progress on the next visit.  2 minutes spent on counseling. - nicotine (NICODERM CQ - DOSED IN MG/24 HOURS) 21 mg/24hr patch; Place 1 patch (21 mg total) onto the skin daily. For the first month  Dispense: 28 patch; Refill: 0  5. Polysubstance abuse (HCC) Advised to quit.  Discussed ongoing health risks associated with ongoing use of street drugs particularly heroin and cocaine.  He is interested in trying to get into a Suboxone program.  I will have our LCSW touch base with him about programs in the area.  6. Influenza vaccination declined Recommended.  Patient declined. 7. Tetanus, diphtheria, and acellular pertussis (Tdap) vaccination declined Recommended.  Patient declined.   Patient was given the opportunity to ask  questions.  Patient verbalized understanding of the plan and was able to repeat key elements of the plan.   This documentation was completed using Paediatric nurse.  Any transcriptional errors are unintentional.  No orders of the defined types were placed in this encounter.    Requested Prescriptions   Signed Prescriptions Disp Refills   nicotine (NICODERM CQ - DOSED IN MG/24 HOURS) 21 mg/24hr patch 28 patch 0    Sig: Place 1 patch (21 mg total) onto the skin daily. For the first month   rosuvastatin (CRESTOR) 40 MG tablet 90 tablet 1    Sig: Take 1 tablet (40 mg total) by mouth daily.   sacubitril-valsartan (ENTRESTO) 24-26 MG 60 tablet 4    Sig: Take 1 tablet by mouth 2 (two) times daily.   empagliflozin (JARDIANCE) 10 MG TABS tablet 90 tablet 1    Sig: Take 1 tablet (10 mg total) by mouth daily.   clopidogrel (PLAVIX) 75 MG tablet 90 tablet 1    Sig: Take 1 tablet (75 mg total) by mouth daily.   aspirin 81 MG EC tablet 90 tablet 1    Sig: Take 1 tablet (81 mg total) by mouth daily.   furosemide (LASIX) 20 MG tablet 90 tablet 3    Sig: Take 1 tablet (20 mg total) by mouth as needed for fluid or edema (WEIGHT GAIN OF 2 POUNDS OVERNIGHT OR 5 POUNDS IN A WEEK).    Return in about 3 months (around 10/14/2021).  Jonah Blue, MD, FACP

## 2021-07-15 ENCOUNTER — Other Ambulatory Visit: Payer: Self-pay

## 2021-08-15 ENCOUNTER — Other Ambulatory Visit: Payer: Self-pay

## 2021-08-18 ENCOUNTER — Emergency Department (HOSPITAL_COMMUNITY): Payer: Medicaid - Out of State

## 2021-08-18 ENCOUNTER — Emergency Department (HOSPITAL_COMMUNITY)
Admission: EM | Admit: 2021-08-18 | Discharge: 2021-08-18 | Discharge status: Against Medical Advice | Payer: Medicaid - Out of State | Attending: Emergency Medicine | Admitting: Emergency Medicine

## 2021-08-18 ENCOUNTER — Other Ambulatory Visit: Payer: Self-pay

## 2021-08-18 DIAGNOSIS — R079 Chest pain, unspecified: Secondary | ICD-10-CM | POA: Insufficient documentation

## 2021-08-18 DIAGNOSIS — Z5321 Procedure and treatment not carried out due to patient leaving prior to being seen by health care provider: Secondary | ICD-10-CM | POA: Insufficient documentation

## 2021-08-18 DIAGNOSIS — R531 Weakness: Secondary | ICD-10-CM | POA: Diagnosis not present

## 2021-08-18 DIAGNOSIS — R5383 Other fatigue: Secondary | ICD-10-CM | POA: Insufficient documentation

## 2021-08-18 DIAGNOSIS — R1110 Vomiting, unspecified: Secondary | ICD-10-CM | POA: Insufficient documentation

## 2021-08-18 DIAGNOSIS — R61 Generalized hyperhidrosis: Secondary | ICD-10-CM | POA: Diagnosis not present

## 2021-08-18 LAB — BASIC METABOLIC PANEL
Anion gap: 15 (ref 5–15)
BUN: 17 mg/dL (ref 6–20)
CO2: 22 mmol/L (ref 22–32)
Calcium: 9.8 mg/dL (ref 8.9–10.3)
Chloride: 97 mmol/L — ABNORMAL LOW (ref 98–111)
Creatinine, Ser: 0.78 mg/dL (ref 0.61–1.24)
GFR, Estimated: >60 mL/min (ref >60)
Glucose, Bld: 117 mg/dL — ABNORMAL HIGH (ref 70–99)
Potassium: 3.3 mmol/L — ABNORMAL LOW (ref 3.5–5.1)
Sodium: 134 mmol/L — ABNORMAL LOW (ref 135–145)

## 2021-08-18 LAB — CBC
HCT: 44.9 % (ref 39.0–52.0)
Hemoglobin: 15.3 g/dL (ref 13.0–17.0)
MCH: 30.2 pg (ref 26.0–34.0)
MCHC: 34.1 g/dL (ref 30.0–36.0)
MCV: 88.7 fL (ref 80.0–100.0)
Platelets: 259 10*3/uL (ref 150–400)
RBC: 5.06 MIL/uL (ref 4.22–5.81)
RDW: 11.9 % (ref 11.5–15.5)
WBC: 10.4 10*3/uL (ref 4.0–10.5)
nRBC: 0 % (ref 0.0–0.2)

## 2021-08-18 LAB — TROPONIN I (HIGH SENSITIVITY)
Troponin I (High Sensitivity): 104 ng/L (ref <18)
Troponin I (High Sensitivity): 111 ng/L (ref <18)
Troponin I (High Sensitivity): 130 ng/L (ref <18)

## 2021-08-18 MED ORDER — NITROGLYCERIN 0.4 MG SL SUBL: 0.4000 mg | SUBLINGUAL_TABLET | SUBLINGUAL | Status: DC | PRN

## 2021-08-18 MED ORDER — ASPIRIN 81 MG PO CHEW: 324.0000 mg | CHEWABLE_TABLET | Freq: Once | ORAL | Status: DC

## 2021-08-18 NOTE — ED Notes (Signed)
Called pts name 3x to be roomed with no response.  

## 2021-08-18 NOTE — ED Provider Triage Note (Signed)
Emergency Medicine Provider Triage Evaluation Note ? ?Christopher Gibson , a 51 y.o. male  was evaluated in triage.  Pt complains of cp. ? ?Review of Systems  ?Positive: Cp, sob, fatigue, diaphoresis, n/v/d ?Negative: cough ? ?Physical Exam  ?BP (!) 157/94 (BP Location: Left Arm)   Pulse 78   Temp 99.6 ?F (37.6 ?C) (Oral)   Resp 20   SpO2 100%  ?Gen:   Awake, ill appearing ?Resp:  Normal effort  ?MSK:   Moves extremities without difficulty  ?Other:   ? ?Medical Decision Making  ?Medically screening exam initiated at 10:11 AM.  Appropriate orders placed.  Christopher Gibson was informed that the remainder of the evaluation will be completed by another provider, this initial triage assessment does not replace that evaluation, and the importance of remaining in the ED until their evaluation is complete. ? ?Hx of polysubstance use including heroine use.  Hx of cardiac disease, prior STEMI.  Last use 3 days ago, having withdrawal sxs and also cp.   ?  ?Domenic Moras, PA-C ?08/18/21 1015 ? ?

## 2021-08-18 NOTE — ED Triage Notes (Addendum)
Pt reports last using heroin 3 days ago. Now has weakness, fatigue and vomiting, thinks that he may be detoxing. Diaphoretic and vomiting at triage. ?

## 2021-08-19 NOTE — Progress Notes (Deleted)
?Cardiology Office Note:   ? ?Date:  08/19/2021  ? ?ID:  Christopher Gibson, DOB Jun 10, 1970, MRN [id number] ? ?PCP:  Ladell Pier, MD ?  ?[facility] ?Cardiologist:  Sinclair Grooms, MD    ? ?Referring MD: Ladell Pier, MD  ? ?Chief Complaint: hospital f/u CHF, CAD ? ?History of Present Illness:   ? ?Christopher Gibson is a 51 y.o. male with a hx of CAD s/p STEMI, chronic combined systolic and diastolic CHF, hyperlipidemia, and polysubstance abuse.  ? ?He presented to the [facility] ED on 10/12/20 with substernal chest pressure radiating to left shoulder that had persisted all day. He admitted to cocaine use just prior to having chest pain. He denied prior cardiac history. Hs troponins very high at 2817 ? > 24000. EKG initially revealed ST changes but no STEMI @ 2336; repeat EKG @ 2341 revealed dynamic changes consistent with early STEMI. EKGs were reviewed with cardiology and patient was taken to the cath lab. Cardiac catheterization revealed late presenting anterior infarction due to occlusion of mid LAD with successful stenting of mid LAD, widely patent left main, diffuse luminal irregularities in the proximal and mid LAD, diffuse mild to moderate nonobstructive atherosclerosis in the circumflex, diffuse moderate to severe nonobstructive atherosclerosis in the proximal to distal RCA, apical dyskinesis probable layered apical thrombus.  EF 30 to 35%, LVEDP 24 mmHg acute systolic heart failure based upon hemodynamics and wall motion.  Recommendation for aggressive risk factor modification with high intensity statin, blood pressure control, uninterrupted DAPT with Brilinta and aspirin. LVEF by echo on 10/13/20 was 42%, mild LVH, G1DD, no rwma.  ? ?He was hospitalized 11/10-11/12/22 and cardiology was asked to consult for EKG originally read out as STEMI but was later discovered to have TWI similar to previous EKGs. He admitted to continued use of cocaine. Hs troponin only mildly elevated at 10 ?20.  No  adverse arrhythmias on telemetry. Hs troponin was elevated at > 4500. CTA of the chest showed no aneurysm and no dissection.  Echo revealed EF 45 to 50%, mid segments hypokinetic, no significant change from 10/13/2020.  LV thrombus appeared to have resolved.   ? ?He has previously not maintained compliance with routine cardiology follow-up or with medications. Prior history of elevated transaminases - held statin ? ?On 06/08/21, he presented to [facility] ED for evaluation of chest pain present for a few hours.  Diaphoresis, nausea and vomiting.  He denied using alcohol or drugs for the previous 2 months.  Was out of Plavix for at least 5 days.  EKG revealed SR with anterior ST elevations with associated Q waves. Medical therapy for MI was initiated and cardiac cath was planned, however he became hypotensive and cath was delayed. Cardiac cath on 2/3 revealed chronically occluded LAD stent with mild to moderate disease elsewhere, severely elevated LVEDP felt to be the cause of patient's elevated troponin, severe left ventricular dysfunction with EF approximately 25 to 30%.  Mentations for goal-directed medical therapy for acute on chronic systolic heart failure.  ? ?He was last seen in our office by me on 06/23/21. Living with his sister and has family who provide transportation to appointment.  He reported no further symptoms of chest pain since discharge.  Admitted to shortness of breath, no edema, no pnd, no orthopnea. Not very physically active. Reported not using illicit drugs and rare ETOH consumption. Continued tobacco abuse but expressed interest in quitting and was given Rx for nicotine patches and well  as information to contact [facility]Quit[facility]Now.  ? ?He presented to [facility] ED on 08/18/2021 with reported chest pain, weakness, fatigue and vomiting, thinking he was having symptoms of withdrawal from heroin use 3 days prior. Hs troponin mildly elevated at 104 ? 111 ? 130. Potassium was low at 3.3, normal creatinine.  ? ?Today, he is  here  ? ?Past Medical History:  ?Diagnosis Date  ? CAD (coronary artery disease)   ? Chronic combined systolic and diastolic CHF (congestive heart failure) ([place])   ? Cocaine abuse ([place])   ? Heroin abuse ([place])   ? HTN (hypertension)   ? S/P angioplasty with stent 10/13/20 DES to mLAD   ? STEMI (ST elevation myocardial infarction) ([place]) 10/13/2020  ? ? ?Past Surgical History:  ?Procedure Laterality Date  ? CORONARY/GRAFT ACUTE MI REVASCULARIZATION N/A 10/13/2020  ? Procedure: Coronary/Graft Acute MI Revascularization;  Surgeon: Belva Crome, MD;  Location: [place] [facility];  Service: Cardiovascular;  Laterality: N/A;  ? LEFT HEART CATH AND CORONARY ANGIOGRAPHY N/A 10/13/2020  ? Procedure: LEFT HEART CATH AND CORONARY ANGIOGRAPHY;  Surgeon: Belva Crome, MD;  Location: [place] [facility];  Service: Cardiovascular;  Laterality: N/A;  ? LEFT HEART CATH AND CORONARY ANGIOGRAPHY N/A 06/08/2021  ? Procedure: LEFT HEART CATH AND CORONARY ANGIOGRAPHY;  Surgeon: Early Osmond, MD;  Location: [place] [facility];  Service: Cardiovascular;  Laterality: N/A;  ? ? ?Current Medications: ?No outpatient medications have been marked as taking for the 08/22/21 encounter (Appointment) with Ann Maki, Lanice Schwab, NP.  ?  ? ?Allergies:   Keflex [cephalexin]  ? ?Social History  ? ?Socioeconomic History  ? Marital status: Single  ?  Spouse name: Not on file  ? Number of children: Not on file  ? Years of education: Not on file  ? Highest education level: Not on file  ?Occupational History  ? Not on file  ?Tobacco Use  ? Smoking status: Every Day  ? Smokeless tobacco: Never  ?Substance and Sexual Activity  ? Alcohol use: Not Currently  ? Drug use: Yes  ?  Types: Cocaine, Heroin  ? Sexual activity: Not on file  ?Other Topics Concern  ? Not on file  ?Social History Narrative  ? Not on file  ? ?Social Determinants of Health  ? ?Financial Resource Strain: High Risk  ? Difficulty of Paying Living Expenses: Hard  ?Food Insecurity: Food Insecurity  Present  ? Worried About Charity fundraiser in the Last Year: Sometimes true  ? Ran Out of Food in the Last Year: Sometimes true  ?Transportation Needs: No Transportation Needs  ? Lack of Transportation (Medical): No  ? Lack of Transportation (Non-Medical): No  ?Physical Activity: Not on file  ?Stress: Not on file  ?Social Connections: Not on file  ?  ? ?Family History: ?The patient's family history is negative for Coronary artery disease. ? ?ROS:   ?Please see the history of present illness.   ?+ shortness of breath ?All other systems reviewed and are negative. ? ?Labs/Other Studies Reviewed:   ? ?The following studies were reviewed today: ? ?Echo 06/08/21 ? ?Left Ventricle: Left ventricular ejection fraction, by estimation, is 35  ?to 40%. The left ventricle has moderately decreased function. The left  ?ventricle demonstrates regional wall motion abnormalities. Definity  ?contrast agent was given IV to delineate the left ventricular endocardial borders. The left ventricular internal  ?cavity size was normal in size. There is mild concentric left ventricular  ?hypertrophy. Left ventricular diastolic  parameters are consistent with  ?Grade I diastolic dysfunction (impaired relaxation).  ?  ?LV Wall Scoring:  ?The mid and distal anterior wall, anterior septum, apical inferior  ?segment, basal inferoseptal segment, and apex are akinetic. The antero-lateral  ?wall, inferior wall, basal inferolateral segment, apical lateral segment, mid  ?inferoseptal segment, apical septal segment, and basal anterior segment  ?are hypokinetic.  ?Right Ventricle: The right ventricular size is normal. No increase in  ?right ventricular wall thickness. Right ventricular systolic function is  ?normal.  ?Left Atrium: Left atrial size was normal in size.  ?Right Atrium: Right atrial size was normal in size.  ?Pericardium: There is no evidence of pericardial effusion.  ?Mitral Valve: The mitral valve is normal in structure. No evidence of   ?mitral valve regurgitation. No evidence of mitral valve stenosis.  ?Tricuspid Valve: The tricuspid valve is normal in structure. Tricuspid  ?valve regurgitation is not demonstrated. No evidence of tricuspid  ?sten

## 2021-08-22 ENCOUNTER — Ambulatory Visit: Payer: Medicaid - Out of State | Admitting: Nurse Practitioner

## 2021-08-31 ENCOUNTER — Emergency Department (HOSPITAL_COMMUNITY)
Admission: EM | Admit: 2021-08-31 | Discharge: 2021-09-01 | Disposition: A | Discharge status: Home / Self Care | Payer: Medicaid - Out of State | Attending: Emergency Medicine | Admitting: Emergency Medicine

## 2021-08-31 ENCOUNTER — Encounter (HOSPITAL_COMMUNITY): Payer: Self-pay | Admitting: Emergency Medicine

## 2021-08-31 ENCOUNTER — Other Ambulatory Visit: Payer: Self-pay

## 2021-08-31 DIAGNOSIS — R197 Diarrhea, unspecified: Secondary | ICD-10-CM | POA: Diagnosis not present

## 2021-08-31 DIAGNOSIS — F1193 Opioid use, unspecified with withdrawal: Secondary | ICD-10-CM

## 2021-08-31 DIAGNOSIS — Z79899 Other long term (current) drug therapy: Secondary | ICD-10-CM | POA: Diagnosis not present

## 2021-08-31 DIAGNOSIS — Z7902 Long term (current) use of antithrombotics/antiplatelets: Secondary | ICD-10-CM | POA: Insufficient documentation

## 2021-08-31 DIAGNOSIS — Z7982 Long term (current) use of aspirin: Secondary | ICD-10-CM | POA: Diagnosis not present

## 2021-08-31 DIAGNOSIS — R112 Nausea with vomiting, unspecified: Secondary | ICD-10-CM | POA: Diagnosis present

## 2021-08-31 DIAGNOSIS — F1123 Opioid dependence with withdrawal: Secondary | ICD-10-CM | POA: Insufficient documentation

## 2021-08-31 LAB — CBC WITH DIFFERENTIAL/PLATELET
Abs Immature Granulocytes: 0.02 10*3/uL (ref 0.00–0.07)
Basophils Absolute: 0 10*3/uL (ref 0.0–0.1)
Basophils Relative: 0 %
Eosinophils Absolute: 0 10*3/uL (ref 0.0–0.5)
Eosinophils Relative: 1 %
HCT: 38.4 % — ABNORMAL LOW (ref 39.0–52.0)
Hemoglobin: 13.4 g/dL (ref 13.0–17.0)
Immature Granulocytes: 0 %
Lymphocytes Relative: 16 %
Lymphs Abs: 0.7 10*3/uL (ref 0.7–4.0)
MCH: 32.4 pg (ref 26.0–34.0)
MCHC: 34.9 g/dL (ref 30.0–36.0)
MCV: 93 fL (ref 80.0–100.0)
Monocytes Absolute: 0.3 10*3/uL (ref 0.1–1.0)
Monocytes Relative: 7 %
Neutro Abs: 3.4 10*3/uL (ref 1.7–7.7)
Neutrophils Relative %: 76 %
Platelets: 284 10*3/uL (ref 150–400)
RBC: 4.13 MIL/uL — ABNORMAL LOW (ref 4.22–5.81)
RDW: 12.1 % (ref 11.5–15.5)
WBC: 4.5 10*3/uL (ref 4.0–10.5)
nRBC: 0 % (ref 0.0–0.2)

## 2021-08-31 LAB — URINALYSIS, ROUTINE W REFLEX MICROSCOPIC
Bacteria, UA: NONE SEEN
Bilirubin Urine: NEGATIVE
Glucose, UA: >=500 mg/dL — AB
Hgb urine dipstick: NEGATIVE
Ketones, ur: 20 mg/dL — AB
Leukocytes,Ua: NEGATIVE
Nitrite: NEGATIVE
Protein, ur: NEGATIVE mg/dL
Specific Gravity, Urine: 1.021 (ref 1.005–1.030)
pH: 7 (ref 5.0–8.0)

## 2021-08-31 LAB — COMPREHENSIVE METABOLIC PANEL
ALT: 12 U/L (ref 0–44)
AST: 16 U/L (ref 15–41)
Albumin: 3.2 g/dL — ABNORMAL LOW (ref 3.5–5.0)
Alkaline Phosphatase: 52 U/L (ref 38–126)
Anion gap: 9 (ref 5–15)
BUN: 10 mg/dL (ref 6–20)
CO2: 20 mmol/L — ABNORMAL LOW (ref 22–32)
Calcium: 9.1 mg/dL (ref 8.9–10.3)
Chloride: 106 mmol/L (ref 98–111)
Creatinine, Ser: 0.73 mg/dL (ref 0.61–1.24)
GFR, Estimated: >60 mL/min (ref >60)
Glucose, Bld: 85 mg/dL (ref 70–99)
Potassium: 4.2 mmol/L (ref 3.5–5.1)
Sodium: 135 mmol/L (ref 135–145)
Total Bilirubin: 0.5 mg/dL (ref 0.3–1.2)
Total Protein: 6.9 g/dL (ref 6.5–8.1)

## 2021-08-31 LAB — RAPID URINE DRUG SCREEN, HOSP PERFORMED
Amphetamines: NOT DETECTED
Barbiturates: NOT DETECTED
Benzodiazepines: NOT DETECTED
Cocaine: NOT DETECTED
Opiates: NOT DETECTED
Tetrahydrocannabinol: POSITIVE — AB

## 2021-08-31 LAB — ETHANOL: Alcohol, Ethyl (B): <10 mg/dL (ref <10)

## 2021-08-31 LAB — LIPASE, BLOOD: Lipase: 21 U/L (ref 11–51)

## 2021-08-31 NOTE — ED Triage Notes (Signed)
Pt reports upset stomach/N/V tonight.  He is trying stop using heroin, last use was yesterday.   ?

## 2021-08-31 NOTE — ED Provider Triage Note (Signed)
Emergency Medicine Provider Triage Evaluation Note ? ?Christopher Gibson , a 51 y.o. male  was evaluated in triage.  Pt complains of nausea, vomiting, generalized abdominal pain.  Patient reports that he is showing to stop using heroin.  Patient last used heroin yesterday.  Today he developed nausea, vomiting, and generalized abdominal pain.  Patient reports that he has vomited 3 times.  Describes emesis as bilious.  Reports decreased appetite and chills. ? ?Denies any other illicit drug use.  Denies any alcohol use. ? ?Review of Systems  ?Positive: Nausea, vomiting, generalized abdominal pain, chills, decreased appetite, urinary frequency ?Negative: Dysuria, hematuria, urinary urgency, blood in stool, melena, swelling or tenderness to genitals ? ?Physical Exam  ?BP 136/90 (BP Location: Right Arm)   Pulse 84   Temp 99.2 ?F (37.3 ?C) (Oral)   Resp 16   SpO2 99%  ?Gen:   Awake, no distress   ?Resp:  Normal effort  ?MSK:   Moves extremities without difficulty  ?Other:  Abdomen soft, nondistended, nontender with no guarding or rebound tenderness. ? ?Medical Decision Making  ?Medically screening exam initiated at 10:03 PM.  Appropriate orders placed.  Christopher Gibson was informed that the remainder of the evaluation will be completed by another provider, this initial triage assessment does not replace that evaluation, and the importance of remaining in the ED until their evaluation is complete. ? ?We will order abdominal labs.  Suspect that patient's symptoms may be secondary to opiate withdrawal. ?  ?Haskel Schroeder, PA-C ?08/31/21 2205 ? ?

## 2021-09-01 ENCOUNTER — Other Ambulatory Visit (HOSPITAL_COMMUNITY): Payer: Self-pay

## 2021-09-01 MED ORDER — CLONIDINE HCL 0.1 MG PO TABS
0.1000 mg | ORAL_TABLET | ORAL | Status: AC
  Administered 2021-09-01: 0.1 mg via ORAL
  Filled 2021-09-01: qty 1

## 2021-09-01 MED ORDER — CLONIDINE HCL 0.1 MG PO TABS
ORAL_TABLET | ORAL | 0 refills | Status: AC
  Filled 2021-09-01: qty 7, 3d supply, fill #0

## 2021-09-01 MED ORDER — PROMETHAZINE HCL 25 MG PO TABS
25.0000 mg | ORAL_TABLET | Freq: Four times a day (QID) | ORAL | 0 refills | Status: DC | PRN
  Filled 2021-09-01: qty 30, 8d supply, fill #0

## 2021-09-01 MED ORDER — SODIUM CHLORIDE 0.9 % IV BOLUS
1000.0000 mL | Freq: Once | INTRAVENOUS | Status: AC
Start: 2021-09-01 — End: 2021-09-01
  Administered 2021-09-01: 1000 mL via INTRAVENOUS

## 2021-09-01 MED ORDER — ONDANSETRON HCL 4 MG/2ML IJ SOLN
4.0000 mg | Freq: Once | INTRAMUSCULAR | Status: AC
  Administered 2021-09-01: 4 mg via INTRAVENOUS
  Filled 2021-09-01: qty 2

## 2021-09-01 MED ORDER — LOPERAMIDE HCL 2 MG PO CAPS: 2.0000 mg | ORAL_CAPSULE | ORAL | Status: DC | PRN

## 2021-09-01 MED ORDER — LOPERAMIDE HCL 2 MG PO CAPS
2.0000 mg | ORAL_CAPSULE | Freq: Four times a day (QID) | ORAL | 0 refills | Status: DC | PRN
  Filled 2021-09-01: qty 12, 3d supply, fill #0

## 2021-09-01 MED ORDER — HYDROXYZINE HCL 25 MG PO TABS
25.0000 mg | ORAL_TABLET | ORAL | Status: AC
  Administered 2021-09-01: 25 mg via ORAL
  Filled 2021-09-01: qty 1

## 2021-09-01 MED ORDER — DICYCLOMINE HCL 20 MG PO TABS
20.0000 mg | ORAL_TABLET | ORAL | Status: AC
  Administered 2021-09-01: 20 mg via ORAL
  Filled 2021-09-01: qty 1

## 2021-09-01 NOTE — ED Notes (Signed)
Patient verbalizes understanding of discharge instructions. Opportunity for questioning and answers were provided. Pt taken to waiting room via wheelchair.  ?

## 2021-09-01 NOTE — ED Provider Notes (Signed)
?[facility] ?Provider Note ? ? ?CSN: [id number] ?Arrival date & time: 08/31/21  1950 ? ?  ? ?History ? ?Chief Complaint  ?Patient presents with  ? Emesis  ? Drug Problem  ? ? ?Christopher Gibson is a 51 y.o. male. ? ?Patient presents to the emergency department for evaluation of nausea, vomiting, diarrhea with abdominal cramping, sweating.  Patient reports that Christopher Gibson is trying to get off of heroin and has not used in 24 hours. ? ? ?  ? ?Home Medications ?Prior to Admission medications   ?Medication Sig Start Date End Date Taking? Authorizing Provider  ?cloNIDine (CATAPRES) 0.1 MG tablet Take one tablet 4 times today, then 2 times tomorrow, then one time the next day, then stop 09/01/21  Yes Karielle Davidow, Gwenyth Allegra, MD  ?loperamide (IMODIUM) 2 MG capsule Take 1 capsule (2 mg total) by mouth 4 (four) times daily as needed for diarrhea or loose stools. 09/01/21  Yes Iyania Denne, Gwenyth Allegra, MD  ?promethazine (PHENERGAN) 25 MG tablet Take 1 tablet (25 mg total) by mouth every 6 (six) hours as needed for nausea or vomiting. 09/01/21  Yes Lucerito Rosinski, Gwenyth Allegra, MD  ?aspirin 81 MG EC tablet Take 1 tablet (81 mg total) by mouth daily. 07/14/21   Ladell Pier, MD  ?clopidogrel (PLAVIX) 75 MG tablet Take 1 tablet (75 mg total) by mouth daily. 07/14/21   Ladell Pier, MD  ?empagliflozin (JARDIANCE) 10 MG TABS tablet Take 1 tablet (10 mg total) by mouth daily. 07/14/21   Ladell Pier, MD  ?furosemide (LASIX) 20 MG tablet Take 1 tablet (20 mg total) by mouth as needed for fluid or edema (WEIGHT GAIN OF 2 POUNDS OVERNIGHT OR 5 POUNDS IN A WEEK). 07/14/21 10/12/21  Ladell Pier, MD  ?nicotine (NICODERM CQ - DOSED IN MG/24 HOURS) 14 mg/24hr patch Place 1 patch (14 mg total) onto the skin daily. For the 2nd month ?Patient not taking: Reported on 07/14/2021 06/23/21   Swinyer, Lanice Schwab, NP  ?nicotine (NICODERM CQ - DOSED IN MG/24 HOURS) 21 mg/24hr patch Place 1 patch (21 mg total) onto the  skin daily. For the first month 07/14/21   Ladell Pier, MD  ?nicotine (NICODERM CQ - DOSED IN MG/24 HR) 7 mg/24hr patch Place 1 patch (7 mg total) onto the skin daily. For the 3rd month 06/23/21   Swinyer, Lanice Schwab, NP  ?nitroGLYCERIN (NITROSTAT) 0.4 MG SL tablet Place 1 tablet (0.4 mg total) under the tongue every 5 (five) minutes as needed for chest pain (CP or SOB). 06/13/21   Cheryln Manly, NP  ?rosuvastatin (CRESTOR) 40 MG tablet Take 1 tablet (40 mg total) by mouth daily. 07/14/21   Ladell Pier, MD  ?sacubitril-valsartan (ENTRESTO) 24-26 MG Take 1 tablet by mouth 2 (two) times daily. 07/14/21   Ladell Pier, MD  ?   ? ?Allergies    ?Keflex [cephalexin]   ? ?Review of Systems   ?Review of Systems  ?Constitutional:  Positive for diaphoresis.  ?Gastrointestinal:  Positive for abdominal pain, diarrhea, nausea and vomiting.  ? ?Physical Exam ?Updated Vital Signs ?BP 132/83   Pulse 92   Temp 99.2 ?F (37.3 ?C) (Oral)   Resp 17   Ht 5\' 6"  (1.676 m)   Wt 81.6 kg   SpO2 98%   BMI 29.05 kg/m?  ?Physical Exam ?Vitals and nursing note reviewed.  ?Constitutional:   ?   General: Christopher Gibson is not in acute distress. ?  Appearance: Christopher Gibson is well-developed.  ?HENT:  ?   Head: Normocephalic and atraumatic.  ?   Mouth/Throat:  ?   Mouth: Mucous membranes are moist.  ?Eyes:  ?   General: Vision grossly intact. Gaze aligned appropriately.  ?   Extraocular Movements: Extraocular movements intact.  ?   Conjunctiva/sclera: Conjunctivae normal.  ?Cardiovascular:  ?   Rate and Rhythm: Normal rate and regular rhythm.  ?   Pulses: Normal pulses.  ?   Heart sounds: Normal heart sounds, S1 normal and S2 normal. No murmur heard. ?  No friction rub. No gallop.  ?Pulmonary:  ?   Effort: Pulmonary effort is normal. No respiratory distress.  ?   Breath sounds: Normal breath sounds.  ?Abdominal:  ?   Palpations: Abdomen is soft.  ?   Tenderness: There is no abdominal tenderness. There is no guarding or rebound.  ?   Hernia: No  hernia is present.  ?Musculoskeletal:     ?   General: No swelling.  ?   Cervical back: Full passive range of motion without pain, normal range of motion and neck supple. No pain with movement, spinous process tenderness or muscular tenderness. Normal range of motion.  ?   Right lower leg: No edema.  ?   Left lower leg: No edema.  ?Skin: ?   General: Skin is warm and dry.  ?   Capillary Refill: Capillary refill takes less than 2 seconds.  ?   Findings: No ecchymosis, erythema, lesion or wound.  ?Neurological:  ?   Mental Status: Christopher Gibson is alert and oriented to person, place, and time.  ?   GCS: GCS eye subscore is 4. GCS verbal subscore is 5. GCS motor subscore is 6.  ?   Cranial Nerves: Cranial nerves 2-12 are intact.  ?   Sensory: Sensation is intact.  ?   Motor: Motor function is intact. No weakness or abnormal muscle tone.  ?   Coordination: Coordination is intact.  ?Psychiatric:     ?   Mood and Affect: Mood normal.     ?   Speech: Speech normal.     ?   Behavior: Behavior normal.  ? ? ?ED Results / Procedures / Treatments   ?Labs ?(all labs ordered are listed, but only abnormal results are displayed) ?Labs Reviewed  ?COMPREHENSIVE METABOLIC PANEL - Abnormal; Notable for the following components:  ?    Result Value  ? CO2 20 (*)   ? Albumin 3.2 (*)   ? All other components within normal limits  ?CBC WITH DIFFERENTIAL/PLATELET - Abnormal; Notable for the following components:  ? RBC 4.13 (*)   ? HCT 38.4 (*)   ? All other components within normal limits  ?RAPID URINE DRUG SCREEN, HOSP PERFORMED - Abnormal; Notable for the following components:  ? Tetrahydrocannabinol POSITIVE (*)   ? All other components within normal limits  ?URINALYSIS, ROUTINE W REFLEX MICROSCOPIC - Abnormal; Notable for the following components:  ? Glucose, UA >=500 (*)   ? Ketones, ur 20 (*)   ? All other components within normal limits  ?LIPASE, BLOOD  ?ETHANOL  ? ? ?EKG ?None ? ?Radiology ?No results found. ? ?Procedures ?Procedures   ? ? ?Medications Ordered in ED ?Medications  ?loperamide (IMODIUM) capsule 2-4 mg (has no administration in time range)  ?cloNIDine (CATAPRES) tablet 0.1 mg (0.1 mg Oral Given 09/01/21 0546)  ?dicyclomine (BENTYL) tablet 20 mg (20 mg Oral Given 09/01/21 0546)  ?hydrOXYzine (ATARAX) tablet 25 mg (  25 mg Oral Given 09/01/21 0546)  ?sodium chloride 0.9 % bolus 1,000 mL (1,000 mLs Intravenous New Bag/Given 09/01/21 0547)  ?ondansetron [facility]) injection 4 mg (4 mg Intravenous Given 09/01/21 0544)  ? ? ?ED Course/ Medical Decision Making/ A&P ?  ?                        ?Medical Decision Making ?Risk ?Prescription drug management. ? ? ?Patient presents to the emergency department with opioid withdrawal syndrome.  Patient trying to stop using heroin, has not used in 24 hours.  Patient with abdominal pain and cramping, nausea, vomiting, diarrhea, diaphoresis, all predictable by his withdrawal syndrome.  Treated symptomatically.  Will discharge with clonidine and symptomatic treatment. ? ? ? ? ? ? ? ?Final Clinical Impression(s) / ED Diagnoses ?Final diagnoses:  ?Opiate withdrawal ([place])  ? ? ?Rx / DC Orders ?ED Discharge Orders   ? ?      Ordered  ?  cloNIDine (CATAPRES) 0.1 MG tablet       ? 09/01/21 0642  ?  promethazine (PHENERGAN) 25 MG tablet  Every 6 hours PRN       ? 09/01/21 0642  ?  loperamide (IMODIUM) 2 MG capsule  4 times daily PRN       ? 09/01/21 [id number]  ? ?  ?  ? ?  ? ? ?  ?Orpah Greek, MD ?09/01/21 [phone] ? ?

## 2021-09-02 ENCOUNTER — Telehealth: Payer: Self-pay | Admitting: Clinical

## 2021-09-02 NOTE — Telephone Encounter (Signed)
I have made several attempts to call this pt and it frequently rings busy. Sent pt suboxone resources and counseling resources in the mail  ?

## 2021-09-09 ENCOUNTER — Other Ambulatory Visit: Payer: Self-pay

## 2021-09-28 NOTE — Progress Notes (Unsigned)
Cardiology Office Note:    Date:  09/28/2021   ID:  Christopher Gibson, DOB 05/04/1971, MRN [id number]  PCP:  Ladell Pier, MD   [facility] Providers Cardiologist:  Sinclair Grooms, MD     Referring MD: Ladell Pier, MD   Chief Complaint: hospital f/u CHF, CAD  History of Present Illness:    Christopher Gibson is a 51 y.o. male with a hx of CAD s/p STEMI, chronic combined systolic and diastolic CHF, hyperlipidemia, and polysubstance abuse.   He presented to [facility] ED on 10[facility]12[facility]20 with substernal chest pressure radiating to left shoulder that had persisted all day. He admitted to cocaine use just prior to having chest pain. He denied prior cardiac history. Hs troponins very high at 2817 ? > 24000. EKG initially revealed ST changes but no STEMI @ 2336; repeat EKG @ 2341 revealed dynamic changes consistent with early STEMI. EKGs were reviewed with cardiology and patient was taken to the cath lab. Cardiac catheterization revealed late presenting anterior infarction due to occlusion of mid LAD with successful stenting of mid LAD, widely patent left main, diffuse luminal irregularities in the proximal and mid LAD, diffuse mild to moderate nonobstructive atherosclerosis in the circumflex, diffuse moderate to severe nonobstructive atherosclerosis in the proximal to distal RCA, apical dyskinesis probable layered apical thrombus.  EF 30 to 35%, LVEDP 24 mmHg acute systolic heart failure based upon hemodynamics and wall motion.  Recommendation for aggressive risk factor modification with high intensity statin, blood pressure control, uninterrupted DAPT with Brilinta and aspirin. LVEF by echo on 10/13/20 was 42%, mild LVH, G1DD, no rwma.   He was hospitalized 11/10-11/12/22 and cardiology was asked to consult for EKG originally read out as STEMI but was later discovered to have TWI similar to previous EKGs. He admitted to continued use of cocaine. Hs troponin only mildly elevated at 10 ?20.  No  adverse arrhythmias on telemetry. CTA of the chest showed no aneurysm and no dissection.  Echo revealed EF 45 to 50%, mid segments hypokinetic, no significant change from 10/13/2020.  LV thrombus appeared to have resolved.    He has previously not maintained compliance with routine cardiology follow-up or with medications. Prior history of elevated transaminases - held statin  On 06/08/21, he presented to [facility] ED for evaluation of chest pain present for a few hours accompanied by diaphoresis, nausea and vomiting.  He denied using alcohol or drugs for the previous 2 months. Hs troponin > 4500.  Was out of Plavix for at least 5 days.  EKG revealed SR with anterior ST elevations with associated Q waves. Medical therapy for MI was initiated and cardiac cath was planned, however he became hypotensive and cath was delayed. Cardiac cath on 2/3 revealed chronically occluded LAD stent with mild to moderate disease elsewhere, severely elevated LVEDP felt to be the cause of patient's elevated troponin, severe left ventricular dysfunction with EF approximately 25 to 30%.  Medical therapy for goal-directed medical therapy for acute on chronic systolic heart failure.   He was last seen in our office by me on 06/23/2021 . Living with his sister and has family who provide transportation to appointment. Not very active since discharge. Reported that the chest pain that he felt when he arrived at the hospital on 2/1 improved prior to discharge and he has had no further chest pain. Having shortness of breath, no edema, no pnd, no orthopnea. States he is not using illicit drugs and is rarely  drinking liquor or beer.  Continues to use tobacco but is trying to quit.  He denies problems with getting his current medications.   Presented to [facility] ED on 08/28/21 for chest pain, elevated hs troponin but he left AMA. Returned 09/01/2021 for help to get off heroin.   Today,   Past Medical History:  Diagnosis Date   CAD (coronary artery  disease)    Chronic combined systolic and diastolic CHF (congestive heart failure) (HCC)    Cocaine abuse (HCC)    Heroin abuse ([place])    HTN (hypertension)    S/P angioplasty with stent 10/13/20 DES to mLAD    STEMI (ST elevation myocardial infarction) ([place]) 10/13/2020    Past Surgical History:  Procedure Laterality Date   CORONARY/GRAFT ACUTE MI REVASCULARIZATION N/A 10/13/2020   Procedure: Coronary/Graft Acute MI Revascularization;  Surgeon: Belva Crome, MD;  Location: [facility];  Service: Cardiovascular;  Laterality: N/A;   LEFT HEART CATH AND CORONARY ANGIOGRAPHY N/A 10/13/2020   Procedure: LEFT HEART CATH AND CORONARY ANGIOGRAPHY;  Surgeon: Belva Crome, MD;  Location: [facility] [facility];  Service: Cardiovascular;  Laterality: N/A;   LEFT HEART CATH AND CORONARY ANGIOGRAPHY N/A 06/08/2021   Procedure: LEFT HEART CATH AND CORONARY ANGIOGRAPHY;  Surgeon: Early Osmond, MD;  Location: [facility];  Service: Cardiovascular;  Laterality: N/A;    Current Medications: No outpatient medications have been marked as taking for the 09/30/21 encounter (Appointment) with Ann Maki, Lanice Schwab, NP.     Allergies:   Keflex [cephalexin]   Social History   Socioeconomic History   Marital status: Single    Spouse name: Not on file   Number of children: Not on file   Years of education: Not on file   Highest education level: Not on file  Occupational History   Not on file  Tobacco Use   Smoking status: Every Day   Smokeless tobacco: Never  Substance and Sexual Activity   Alcohol use: Not Currently   Drug use: Yes    Types: Cocaine, Heroin   Sexual activity: Not on file  Other Topics Concern   Not on file  Social History Narrative   Not on file   Social Determinants of Health   Financial Resource Strain: High Risk   Difficulty of Paying Living Expenses: Hard  Food Insecurity: Food Insecurity Present   Worried About Running Out of Food in the Last Year: Sometimes  true   Ran Out of Food in the Last Year: Sometimes true  Transportation Needs: No Transportation Needs   Lack of Transportation (Medical): No   Lack of Transportation (Non-Medical): No  Physical Activity: Not on file  Stress: Not on file  Social Connections: Not on file     Family History: The patient's family history is negative for Coronary artery disease.  ROS:   Please see the history of present illness.   + shortness of breath All other systems reviewed and are negative.  Labs/Other Studies Reviewed:    The following studies were reviewed today:  Echo 06/08/21  Left Ventricle: Left ventricular ejection fraction, by estimation, is 35  to 40%. The left ventricle has moderately decreased function. The left  ventricle demonstrates regional wall motion abnormalities. Definity  contrast agent was given IV to delineate the left ventricular endocardial borders. The left ventricular internal  cavity size was normal in size. There is mild concentric left ventricular  hypertrophy. Left ventricular diastolic parameters are consistent with  Grade  I diastolic dysfunction (impaired relaxation).    LV Wall Scoring:  The mid and distal anterior wall, anterior septum, apical inferior  segment, basal inferoseptal segment, and apex are akinetic. The antero-lateral  wall, inferior wall, basal inferolateral segment, apical lateral segment, mid  inferoseptal segment, apical septal segment, and basal anterior segment  are hypokinetic.  Right Ventricle: The right ventricular size is normal. No increase in  right ventricular wall thickness. Right ventricular systolic function is  normal.  Left Atrium: Left atrial size was normal in size.  Right Atrium: Right atrial size was normal in size.  Pericardium: There is no evidence of pericardial effusion.  Mitral Valve: The mitral valve is normal in structure. No evidence of  mitral valve regurgitation. No evidence of mitral valve stenosis.   Tricuspid Valve: The tricuspid valve is normal in structure. Tricuspid  valve regurgitation is not demonstrated. No evidence of tricuspid  stenosis.  Aortic Valve: The aortic valve is normal in structure. Aortic valve  regurgitation is not visualized. No aortic stenosis is present.  Pulmonic Valve: The pulmonic valve was normal in structure. Pulmonic valve  regurgitation is not visualized. No evidence of pulmonic stenosis.  Aorta: The aortic root is normal in size and structure.  Venous: The inferior vena cava is normal in size with greater than 50%  respiratory variability, suggesting right atrial pressure of 3 mmHg.  IAS/Shunts: No atrial level shunt detected by color flow Doppler.    LHC 06/10/21    Mid LAD lesion is 100% stenosed.   Prox RCA to Dist RCA lesion is 50% stenosed.   1st Mrg lesion is 60% stenosed.   LV end diastolic pressure is severely elevated.   The left ventricular ejection fraction is 25-35% by visual estimate.   Chronically occluded mid LAD stent (as evidenced by lack of dye staining, difficulty crossing the occlusion with a wire, and inability to pass a balloon) with mild to moderate disease elsewhere.  Severely elevated LVEDP and this is likely the reason for the patient's elevated troponin.  20 mg of Lasix IV was administered in the cardiac catheterization laboratory and will be continued.  Goal-directed medical therapy will be pursued. 3.  Severe left ventricular dysfunction with ejection fraction approximately 25 to 30%.   Recommendations: Goal-directed medical therapy for acute on chronic systolic heart failure.    LHC 10/13/20  A stent was successfully placed.   Late presenting anterior infarction due to occlusion of the mid LAD. Successful stenting of the mid LAD using an 18 x 2.5 Onyx postdilated to 3.5 mm in diameter with TIMI grade III flow. Widely patent left main Diffuse luminal irregularities in the proximal and mid LAD Diffuse mild to moderate  nonobstructive atherosclerosis in the circumflex. 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  EF 30 to 35%.  LVEDP 24 mmHg. Acute systolic heart failure based upon hemodynamics and wall motion.   RECOMMENDATIONS:   Aggressive risk factor modification with high intensity statin therapy, screening for diabetes, blood pressure control. IV nitroglycerin for blood pressure control and to decrease LVEDP.  She did run for 12 to 24 hours Start ARB and beta-blocker therapy as tolerated by blood pressure and clinical status 2D Doppler echocardiogram to assess for apical thrombus. IV heparin will be restarted because of high index of suspicion of apical thrombus. Further management per team   Recent Labs: 10/13/2020: B Natriuretic Peptide 139.1 06/08/2021: Magnesium 2.0 08/31/2021: ALT 12; BUN  10; Creatinine, Ser 0.73; Hemoglobin 13.4; Platelets 284; Potassium 4.2; Sodium 135   Recent Lipid Panel    Component Value Date/Time   CHOL 167 06/08/2021 0514   TRIG 92 06/08/2021 0514   HDL 38 (L) 06/08/2021 0514   CHOLHDL 4.4 06/08/2021 0514   VLDL 18 06/08/2021 0514   LDLCALC 111 (H) 06/08/2021 0514     Risk Assessment/Calculations:      Physical Exam:    VS:  There were no vitals taken for this visit.    Wt Readings from Last 3 Encounters:  08/31/21 180 lb (81.6 kg)  07/14/21 187 lb 12.8 oz (85.2 kg)  06/23/21 183 lb 4.8 oz (83.1 kg)     GEN:  Well nourished, well developed in no acute distress HEENT: Normal NECK: No JVD; No carotid bruits CARDIAC: RRR, no murmurs, rubs, gallops RESPIRATORY:  Clear to auscultation without rales, wheezing or rhonchi  ABDOMEN: Soft, non-tender, non-distended MUSCULOSKELETAL:  No edema; No deformity. 2+ pedal pulses, equal bilaterally SKIN: Warm and dry. Right radial cath site well healed without swelling or discoloration NEUROLOGIC:  Alert and oriented x  3 PSYCHIATRIC:  Normal affect   EKG:  EKG is not ordered today.    Diagnoses:    No diagnosis found.  Assessment and Plan:     Acute on chronic combined CHF: LVEF 35-40%, G1DD by echo 2/1. GDMT was limited by hypotension during hospitalization.  Blood pressure improved today.  Heart rate remains low at 48 bpm.  Will continue to hold on adding beta-blocker. Has shortness of breath, no orthopnea, no PND, no edema. He appears euvolemic today. Encouraged him to monitor daily weight and prn Lasix given for weight gain > 2 lbs in 24 hours and 5 lbs or greater in 1 week. Continue Ferne Coe. Would favor up titration of Entresto or addition of MRA at next office visit if BP allows.   CAD without angina: Prior stenting of the LAD with reocclusion in the setting of noncompliance with medications and cocaine use. Unknown successful PCI of LAD, complete did infarct. He denies chest pain. Has dyspnea that is felt to be related to CHF. No bleeding concerns on aspirin and Plavix.  Continue GDMT including aspirin, Plavix, statin, Entresto.   Essential hypertension: Blood pressure well controlled today.  He does not monitor blood pressure at home. Continue Entresto.   Tobacco use disorder: Information given for 1 800 QUITNOW.  Nicotine patches prescribed and instructions given. Complete cessation advised.  Polysubstance use disorder: Reports he is not currently using illicit drugs.  His drug panel at time of admission 2/1 was positive for cocaine. Assistance offered for counseling for which he politely declined. As noted above, agreeable to smoking cessation offerings.   Hyperlipidemia LDL goal < 70: Previously reported intolerance of statin.  Trial of high-dose Crestor started.  We will get repeat LFTs and lipid panel in 8 weeks.    Disposition: ***       Medication Adjustments/Labs and Tests Ordered: Current medicines are reviewed at length with the patient today.  Concerns regarding  medicines are outlined above.  No orders of the defined types were placed in this encounter.  No orders of the defined types were placed in this encounter.   There are no Patient Instructions on file for this visit.   Signed, Emmaline Life, NP  09/28/2021 3:18 PM    [facility]

## 2021-09-30 ENCOUNTER — Ambulatory Visit: Payer: Medicaid - Out of State | Admitting: Nurse Practitioner

## 2021-10-17 ENCOUNTER — Other Ambulatory Visit (HOSPITAL_COMMUNITY): Payer: Self-pay

## 2021-10-17 ENCOUNTER — Ambulatory Visit: Payer: Self-pay | Admitting: Internal Medicine

## 2021-11-01 ENCOUNTER — Ambulatory Visit: Payer: Medicaid - Out of State | Admitting: Nurse Practitioner

## 2021-11-01 NOTE — Progress Notes (Deleted)
Cardiology Office Note:    Date:  11/01/2021   ID:  SA[place], DOB [birth year], MRN [id number]  PCP:  Marcine Matar, MD   [facility] Providers Cardiologist:  Lesleigh Noe, MD     Referring MD: Marcine Matar, MD   Chief Complaint: hospital f/u CHF, CAD  History of Present Illness:    Christopher Gibson is a 51 y.o. male with a hx of CAD s/p STEMI, chronic combined systolic and diastolic CHF, hyperlipidemia, and polysubstance abuse.   He presented to the [facility] ED on 10/12/20 with substernal chest pressure radiating to left shoulder that had persisted all day. He admitted to cocaine use just prior to having chest pain. He denied prior cardiac history. Hs troponins very high at 2817 ? > 24000. EKG initially revealed ST changes but no STEMI @ 2336; repeat EKG @ 2341 revealed dynamic changes consistent with early STEMI. EKGs were reviewed with cardiology and patient was taken to the cath lab. Cardiac catheterization revealed late presenting anterior infarction due to occlusion of mid LAD with successful stenting of mid LAD, widely patent left main, diffuse luminal irregularities in the proximal and mid LAD, diffuse mild to moderate nonobstructive atherosclerosis in the circumflex, diffuse moderate to severe nonobstructive atherosclerosis in the proximal to distal RCA, apical dyskinesis probable layered apical thrombus.  EF 30 to 35%, LVEDP 24 mmHg acute systolic heart failure based upon hemodynamics and wall motion.  Recommendation for aggressive risk factor modification with high intensity statin, blood pressure control, uninterrupted DAPT with Brilinta and aspirin. LVEF by echo on 10/13/20 was 42%, mild LVH, G1DD, no rwma.   He was hospitalized 11/10-11/12/22 and cardiology was asked to consult for EKG originally read out as STEMI but was later discovered to have TWI similar to previous EKGs. He admitted to continued use of cocaine. Hs troponin only mildly elevated at 10 ?20.  No  adverse arrhythmias on telemetry. Hs troponin was elevated at > 4500. CTA of the chest showed no aneurysm and no dissection.  Echo revealed EF 45 to 50%, mid segments hypokinetic, no significant change from 10/13/2020.  LV thrombus appeared to have resolved.    He has previously not maintained compliance with routine cardiology follow-up or with medications. Prior history of elevated transaminases - held statin  On 06/08/21, he presented to [facility] ED for evaluation of chest pain present for a few hours.  Diaphoresis, nausea and vomiting.  He denied using alcohol or drugs for the previous 2 months.  Was out of Plavix for at least 5 days.  EKG revealed SR with anterior ST elevations with associated Q waves. Medical therapy for MI was initiated and cardiac cath was planned, however he became hypotensive and cath was delayed. Cardiac cath on 2/3 revealed chronically occluded LAD stent with mild to moderate disease elsewhere, severely elevated LVEDP felt to be the cause of patient's elevated troponin, severe left ventricular dysfunction with EF approximately 25 to 30%.  Mentations for goal-directed medical therapy for acute on chronic systolic heart failure.   He was last seen in our office on 06/23/2021 by me.  States he is living with his sister and has family who provide transportation to appointment. Reports that the chest pain that he felt when he arrived at the hospital on 2/1 improved prior to discharge and he has had no further chest pain.  Admits to shortness of breath, no edema, no pnd, no orthopnea.  Has not been very active since hospital discharge.  Reports no illicit drug use and is rarely drinking liquor or beer. Continues to use tobacco but is trying to quit.  He denies problems with getting his current medications.   ER admission 08/31/21 for n/v/d, abdominal cramping, sweating with report that he is trying to get off heroine.   Today, he is   Past Medical History:  Diagnosis Date   CAD (coronary  artery disease)    Chronic combined systolic and diastolic CHF (congestive heart failure) (HCC)    Cocaine abuse ([place])    Heroin abuse ([place])    HTN (hypertension)    S/P angioplasty with stent 10/13/20 DES to mLAD    STEMI (ST elevation myocardial infarction) ([place]) 10/13/2020    Past Surgical History:  Procedure Laterality Date   CORONARY/GRAFT ACUTE MI REVASCULARIZATION N/A 10/13/2020   Procedure: Coronary/Graft Acute MI Revascularization;  Surgeon: Belva Crome, MD;  Location: [place] [facility];  Service: Cardiovascular;  Laterality: N/A;   LEFT HEART CATH AND CORONARY ANGIOGRAPHY N/A 10/13/2020   Procedure: LEFT HEART CATH AND CORONARY ANGIOGRAPHY;  Surgeon: Belva Crome, MD;  Location: [place] [facility];  Service: Cardiovascular;  Laterality: N/A;   LEFT HEART CATH AND CORONARY ANGIOGRAPHY N/A 06/08/2021   Procedure: LEFT HEART CATH AND CORONARY ANGIOGRAPHY;  Surgeon: Early Osmond, MD;  Location: [place] [facility];  Service: Cardiovascular;  Laterality: N/A;    Current Medications: No outpatient medications have been marked as taking for the 11/01/21 encounter (Appointment) with Ann Maki, Lanice Schwab, NP.     Allergies:   Keflex [cephalexin]   Social History   Socioeconomic History   Marital status: Single    Spouse name: Not on file   Number of children: Not on file   Years of education: Not on file   Highest education level: Not on file  Occupational History   Not on file  Tobacco Use   Smoking status: Every Day   Smokeless tobacco: Never  Substance and Sexual Activity   Alcohol use: Not Currently   Drug use: Yes    Types: Cocaine, Heroin   Sexual activity: Not on file  Other Topics Concern   Not on file  Social History Narrative   Not on file   Social Determinants of Health   Financial Resource Strain: High Risk (11/03/2020)   Overall Financial Resource Strain (CARDIA)    Difficulty of Paying Living Expenses: Hard  Food Insecurity: Food Insecurity Present  (10/14/2020)   Hunger Vital Sign    Worried About Running Out of Food in the Last Year: Sometimes true    Ran Out of Food in the Last Year: Sometimes true  Transportation Needs: No Transportation Needs (10/14/2020)   PRAPARE - Hydrologist (Medical): No    Lack of Transportation (Non-Medical): No  Physical Activity: Not on file  Stress: Not on file  Social Connections: Not on file     Family History: The patient's family history is negative for Coronary artery disease.  ROS:   Please see the history of present illness.   + shortness of breath All other systems reviewed and are negative.  Labs/Other Studies Reviewed:    The following studies were reviewed today:  Echo 06/08/21  Left Ventricle: Left ventricular ejection fraction, by estimation, is 35  to 40%. The left ventricle has moderately decreased function. The left  ventricle demonstrates regional wall motion abnormalities. Definity  contrast agent was given IV to delineate the left ventricular endocardial borders.  The left ventricular internal  cavity size was normal in size. There is mild concentric left ventricular  hypertrophy. Left ventricular diastolic parameters are consistent with  Grade I diastolic dysfunction (impaired relaxation).    LV Wall Scoring:  The mid and distal anterior wall, anterior septum, apical inferior  segment, basal inferoseptal segment, and apex are akinetic. The antero-lateral  wall, inferior wall, basal inferolateral segment, apical lateral segment, mid  inferoseptal segment, apical septal segment, and basal anterior segment  are hypokinetic.  Right Ventricle: The right ventricular size is normal. No increase in  right ventricular wall thickness. Right ventricular systolic function is  normal.  Left Atrium: Left atrial size was normal in size.  Right Atrium: Right atrial size was normal in size.  Pericardium: There is no evidence of pericardial effusion.  Mitral  Valve: The mitral valve is normal in structure. No evidence of  mitral valve regurgitation. No evidence of mitral valve stenosis.  Tricuspid Valve: The tricuspid valve is normal in structure. Tricuspid  valve regurgitation is not demonstrated. No evidence of tricuspid  stenosis.  Aortic Valve: The aortic valve is normal in structure. Aortic valve  regurgitation is not visualized. No aortic stenosis is present.  Pulmonic Valve: The pulmonic valve was normal in structure. Pulmonic valve  regurgitation is not visualized. No evidence of pulmonic stenosis.  Aorta: The aortic root is normal in size and structure.  Venous: The inferior vena cava is normal in size with greater than 50%  respiratory variability, suggesting right atrial pressure of 3 mmHg.  IAS/Shunts: No atrial level shunt detected by color flow Doppler.    LHC 06/10/21    Mid LAD lesion is 100% stenosed.   Prox RCA to Dist RCA lesion is 50% stenosed.   1st Mrg lesion is 60% stenosed.   LV end diastolic pressure is severely elevated.   The left ventricular ejection fraction is 25-35% by visual estimate.   Chronically occluded mid LAD stent (as evidenced by lack of dye staining, difficulty crossing the occlusion with a wire, and inability to pass a balloon) with mild to moderate disease elsewhere.  Severely elevated LVEDP and this is likely the reason for the patient's elevated troponin.  20 mg of Lasix IV was administered in the cardiac catheterization laboratory and will be continued.  Goal-directed medical therapy will be pursued. 3.  Severe left ventricular dysfunction with ejection fraction approximately 25 to 30%.   Recommendations: Goal-directed medical therapy for acute on chronic systolic heart failure.    LHC 10/13/20  A stent was successfully placed.   Late presenting anterior infarction due to occlusion of the mid LAD. Successful stenting of the mid LAD using an 18 x 2.5 Onyx postdilated to 3.5 mm in diameter with  TIMI grade III flow. Widely patent left main Diffuse luminal irregularities in the proximal and mid LAD Diffuse mild to moderate nonobstructive atherosclerosis in the circumflex. 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  EF 30 to 35%.  LVEDP 24 mmHg. Acute systolic heart failure based upon hemodynamics and wall motion.   RECOMMENDATIONS:   Aggressive risk factor modification with high intensity statin therapy, screening for diabetes, blood pressure control. IV nitroglycerin for blood pressure control and to decrease LVEDP.  She did run for 12 to 24 hours Start ARB and beta-blocker therapy as tolerated by blood pressure and clinical status 2D Doppler echocardiogram to assess for apical thrombus. IV heparin will be restarted because  of high index of suspicion of apical thrombus. Further management per team   Recent Labs: 06/08/2021: Magnesium 2.0 08/31/2021: ALT 12; BUN 10; Creatinine, Ser 0.73; Hemoglobin 13.4; Platelets 284; Potassium 4.2; Sodium 135   Recent Lipid Panel    Component Value Date/Time   CHOL 167 06/08/2021 0514   TRIG 92 06/08/2021 0514   HDL 38 (L) 06/08/2021 0514   CHOLHDL 4.4 06/08/2021 0514   VLDL 18 06/08/2021 0514   LDLCALC 111 (H) 06/08/2021 0514     Risk Assessment/Calculations:      Physical Exam:    VS:  There were no vitals taken for this visit.    Wt Readings from Last 3 Encounters:  08/31/21 180 lb (81.6 kg)  07/14/21 187 lb 12.8 oz (85.2 kg)  06/23/21 183 lb 4.8 oz (83.1 kg)     GEN:  Well nourished, well developed in no acute distress HEENT: Normal NECK: No JVD; No carotid bruits CARDIAC: RRR, no murmurs, rubs, gallops RESPIRATORY:  Clear to auscultation without rales, wheezing or rhonchi  ABDOMEN: Soft, non-tender, non-distended MUSCULOSKELETAL:  No edema; No deformity. 2+ pedal pulses, equal bilaterally SKIN: Warm and dry. Right radial  cath site well healed without swelling or discoloration NEUROLOGIC:  Alert and oriented x 3 PSYCHIATRIC:  Normal affect   EKG:  EKG is not ordered today.    Diagnoses:    No diagnosis found.  Assessment and Plan:     Acute on chronic combined CHF: LVEF 35-40%, G1DD by echo 2/1. GDMT was limited by hypotension during hospitalization.  Blood pressure improved today.  Heart rate remains low at 48 bpm.  Will continue to hold on adding beta-blocker. Has shortness of breath, no orthopnea, no PND, no edema. He appears euvolemic today. Encouraged him to monitor daily weight and prn Lasix given for weight gain > 2 lbs in 24 hours and 5 lbs or greater in 1 week. Continue Valla Leaver. Would favor up titration of Entresto or addition of MRA at next office visit if BP allows.   CAD without angina: Prior stenting of the LAD with reocclusion in the setting of noncompliance with medications and cocaine use. Unknown successful PCI of LAD, complete did infarct. He denies chest pain. Has dyspnea that is felt to be related to CHF. No bleeding concerns on aspirin and Plavix.  Continue GDMT including aspirin, Plavix, statin, Entresto.   Essential hypertension: Blood pressure well controlled today.  He does not monitor blood pressure at home. Continue Entresto.   Tobacco use disorder: Information given for 1 800 QUITNOW.  Nicotine patches prescribed and instructions given. Complete cessation advised.  Polysubstance use disorder: Reports he is not currently using illicit drugs.  His drug panel at time of admission 2/1 was positive for cocaine. Assistance offered for counseling for which he politely declined. As noted above, agreeable to smoking cessation offerings.   Hyperlipidemia LDL goal < 70: Previously reported intolerance of statin.  Trial of high-dose Crestor started.  We will get repeat LFTs and lipid panel in 8 weeks.    Disposition: ***    Medication Adjustments/Labs and Tests  Ordered: Current medicines are reviewed at length with the patient today.  Concerns regarding medicines are outlined above.  No orders of the defined types were placed in this encounter.  No orders of the defined types were placed in this encounter.   There are no Patient Instructions on file for this visit.   Signed, Levi Aland, NP  11/01/2021 5:47 AM  [facility]

## 2021-12-13 ENCOUNTER — Other Ambulatory Visit (HOSPITAL_COMMUNITY): Payer: Self-pay

## 2021-12-30 DIAGNOSIS — M7521 Bicipital tendinitis, right shoulder: Secondary | ICD-10-CM | POA: Diagnosis not present

## 2021-12-30 DIAGNOSIS — M25511 Pain in right shoulder: Secondary | ICD-10-CM | POA: Diagnosis not present

## 2021-12-30 DIAGNOSIS — M7581 Other shoulder lesions, right shoulder: Secondary | ICD-10-CM | POA: Diagnosis not present

## 2022-01-26 ENCOUNTER — Other Ambulatory Visit: Payer: Self-pay

## 2022-01-30 ENCOUNTER — Other Ambulatory Visit: Payer: Self-pay

## 2022-03-02 IMAGING — CT CT HEAD W/O CM
3 series · 15 of 47 positions shown, 18 images · non-contrast
Comparison: No pertinent prior exams available for comparison.

CLINICAL DATA: Provided history: Mental status change, unknown
cause. Additional provided: Patient reportedly "unresponsive" for
3-4 seconds.

EXAM:
CT HEAD WITHOUT CONTRAST
TECHNIQUE: Contiguous axial images were obtained from the base of the skull
through the vertex without intravenous contrast.

[Series 3: head 5.0 h30s · axial · 0.43mm/px · z∈[-98,+37]mm · 9 of 33 slices shown, 12 images]
[im 3/33  brain]
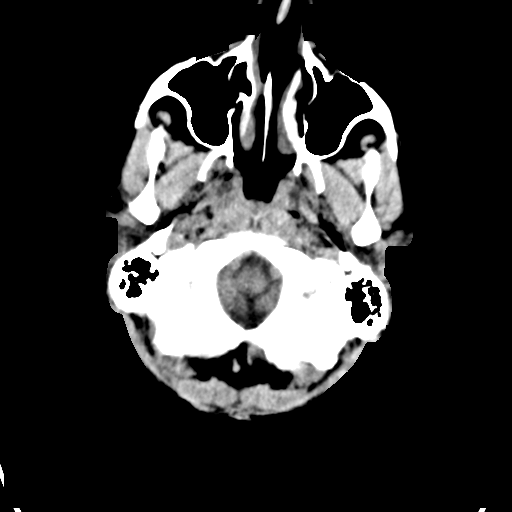
[im 3/33  bone]
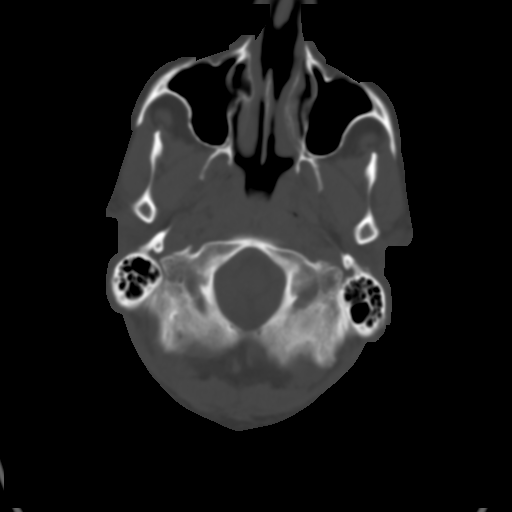
[im 6/33  brain]
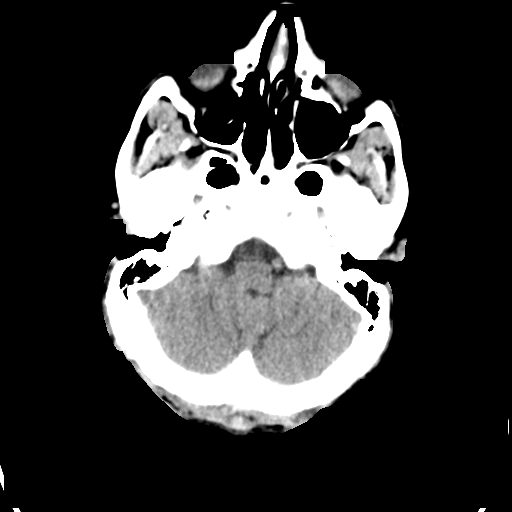
[im 9/33  brain]
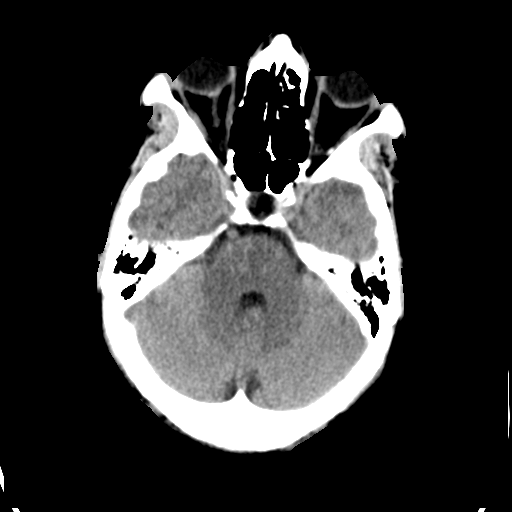
[im 13/33  brain]
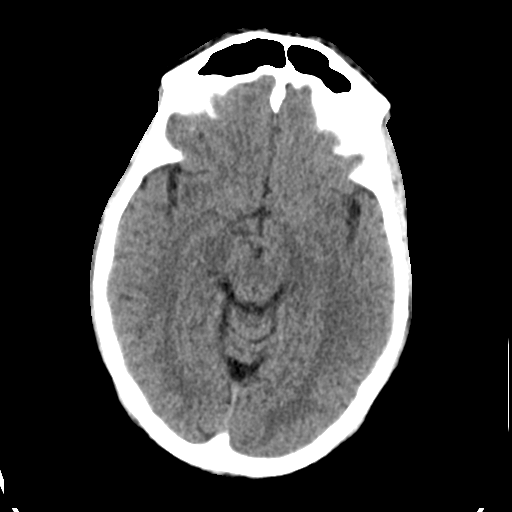
[im 17/33  brain]
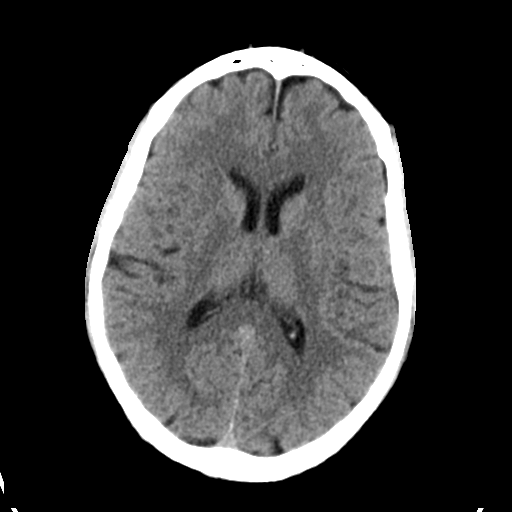
[im 17/33  bone]
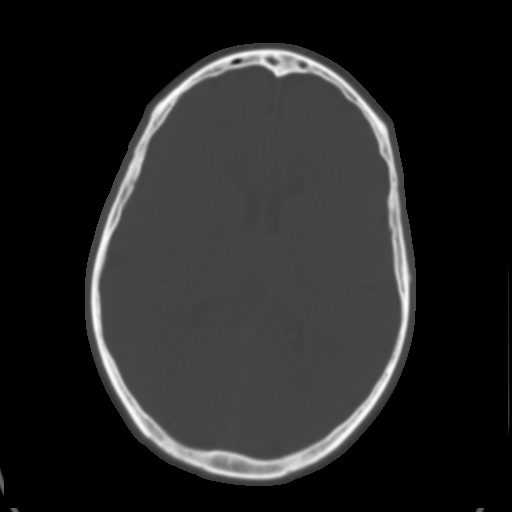
[im 20/33  brain]
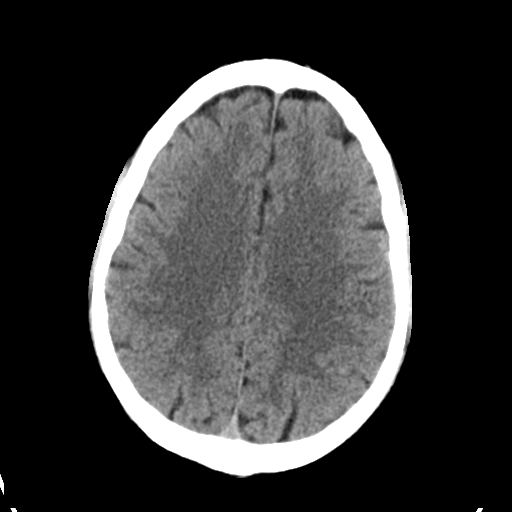
[im 24/33  brain]
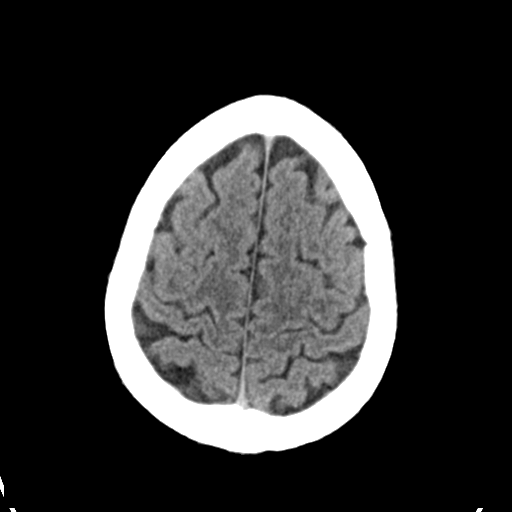
[im 27/33  brain]
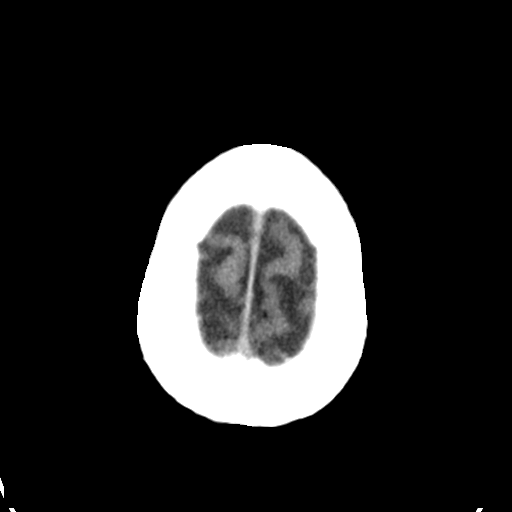
[im 30/33  brain]
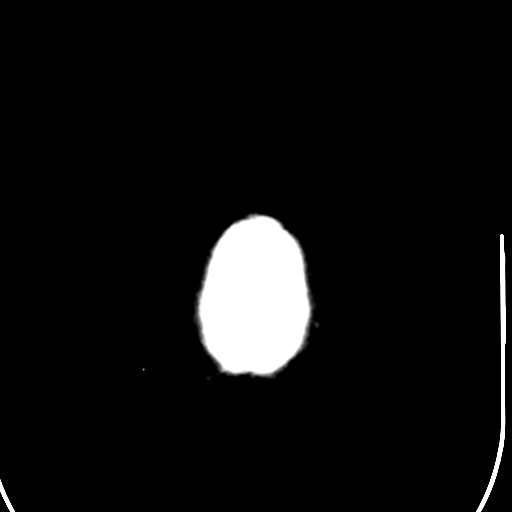
[im 30/33  bone]
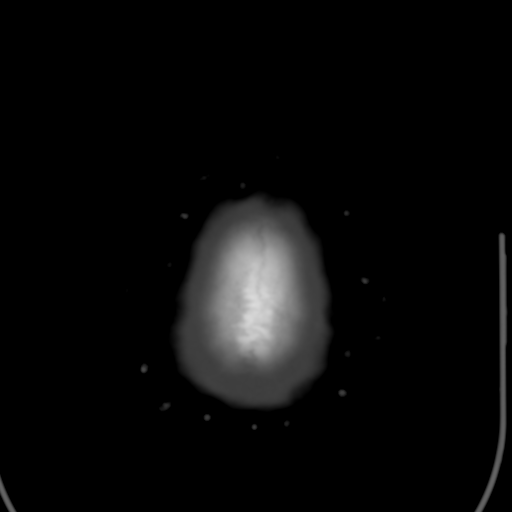

[Series 5: head 3.0 mpr cor · coronal · 0.32mm/px · 3 of 71 slices shown]
[im 24/71  brain]
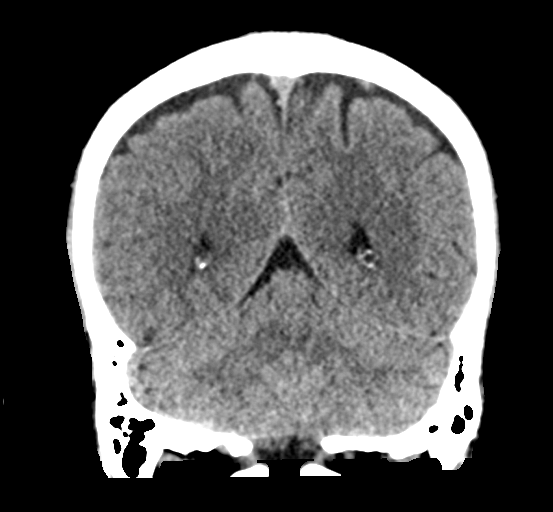
[im 32/71  brain]
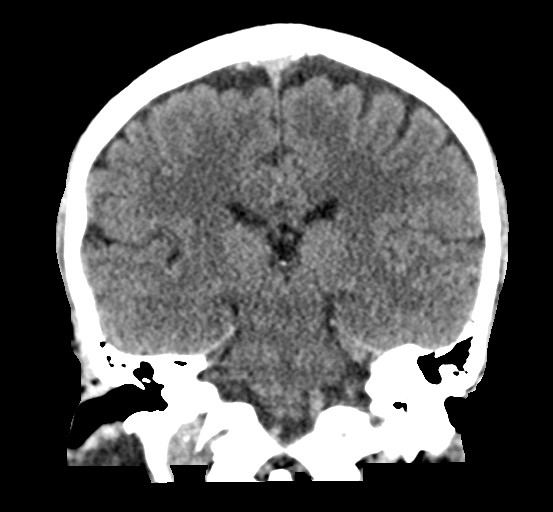
[im 39/71  brain]
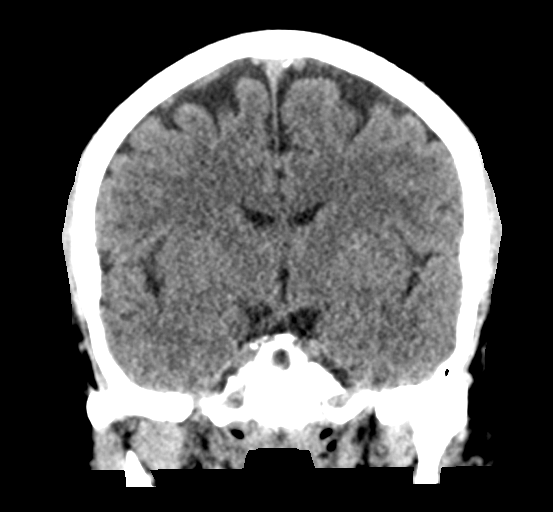

[Series 6: head 3.0 mpr sag · sagittal · 0.32mm/px · 3 of 60 slices shown]
[im 20/60  brain]
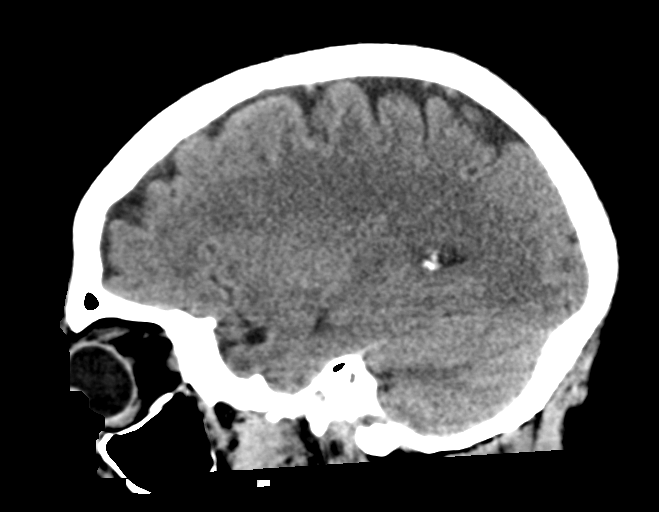
[im 30/60  brain]
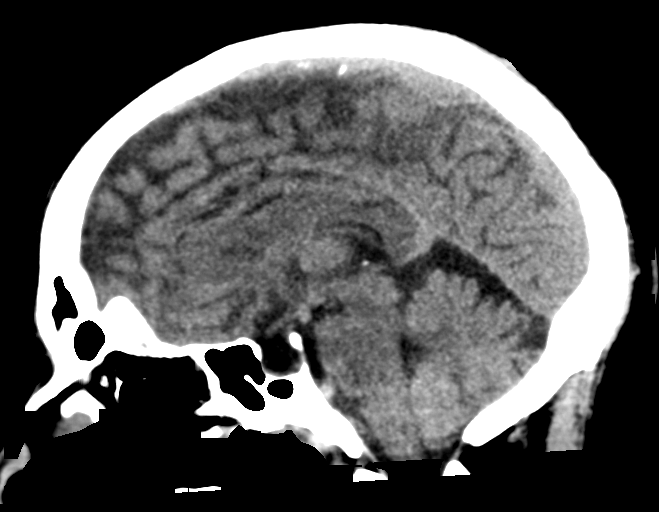
[im 40/60  brain]
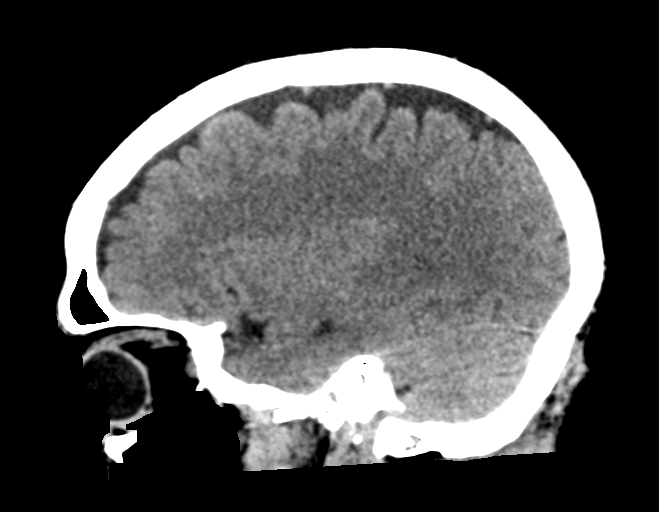

[15 of 47 positions shown; findings below may reference images not displayed]

FINDINGS: Brain:

Cerebral volume is normal.

There is no acute intracranial hemorrhage.

No demarcated cortical infarct.

No extra-axial fluid collection.

No evidence of an intracranial mass.

No midline shift.

Partially empty sella turcica.

Vascular: No hyperdense vessel.  Atherosclerotic calcifications

Skull: Normal. Negative for fracture or focal lesion.

Sinuses/Orbits: Visualized orbits show no acute finding. Partially
imaged left maxillary sinus mucous retention cyst.
IMPRESSION: No evidence of acute intracranial abnormality.

Left maxillary sinus mucous retention cyst, partially imaged.

## 2022-04-27 ENCOUNTER — Other Ambulatory Visit: Payer: Self-pay | Admitting: Internal Medicine

## 2022-04-27 DIAGNOSIS — I2510 Atherosclerotic heart disease of native coronary artery without angina pectoris: Secondary | ICD-10-CM

## 2022-04-27 DIAGNOSIS — I5042 Chronic combined systolic (congestive) and diastolic (congestive) heart failure: Secondary | ICD-10-CM

## 2022-05-11 DIAGNOSIS — M546 Pain in thoracic spine: Secondary | ICD-10-CM | POA: Diagnosis not present

## 2022-05-11 DIAGNOSIS — F1910 Other psychoactive substance abuse, uncomplicated: Secondary | ICD-10-CM | POA: Diagnosis not present

## 2022-05-11 DIAGNOSIS — I252 Old myocardial infarction: Secondary | ICD-10-CM | POA: Diagnosis not present

## 2022-05-11 DIAGNOSIS — J9811 Atelectasis: Secondary | ICD-10-CM | POA: Diagnosis not present

## 2022-05-11 DIAGNOSIS — R4182 Altered mental status, unspecified: Secondary | ICD-10-CM | POA: Diagnosis not present

## 2022-05-11 DIAGNOSIS — R079 Chest pain, unspecified: Secondary | ICD-10-CM | POA: Diagnosis not present

## 2022-05-11 DIAGNOSIS — R9431 Abnormal electrocardiogram [ECG] [EKG]: Secondary | ICD-10-CM | POA: Diagnosis not present

## 2022-05-11 DIAGNOSIS — Z1152 Encounter for screening for COVID-19: Secondary | ICD-10-CM | POA: Diagnosis not present

## 2022-05-11 DIAGNOSIS — R931 Abnormal findings on diagnostic imaging of heart and coronary circulation: Secondary | ICD-10-CM | POA: Diagnosis not present

## 2022-05-11 DIAGNOSIS — R109 Unspecified abdominal pain: Secondary | ICD-10-CM | POA: Diagnosis not present

## 2022-05-11 DIAGNOSIS — J439 Emphysema, unspecified: Secondary | ICD-10-CM | POA: Diagnosis not present

## 2022-05-11 DIAGNOSIS — D1803 Hemangioma of intra-abdominal structures: Secondary | ICD-10-CM | POA: Diagnosis not present

## 2022-05-11 DIAGNOSIS — I10 Essential (primary) hypertension: Secondary | ICD-10-CM | POA: Diagnosis not present

## 2022-05-17 DIAGNOSIS — Z720 Tobacco use: Secondary | ICD-10-CM | POA: Diagnosis not present

## 2022-05-17 DIAGNOSIS — I5022 Chronic systolic (congestive) heart failure: Secondary | ICD-10-CM | POA: Diagnosis not present

## 2022-05-17 DIAGNOSIS — R55 Syncope and collapse: Secondary | ICD-10-CM | POA: Diagnosis not present

## 2022-05-17 DIAGNOSIS — I429 Cardiomyopathy, unspecified: Secondary | ICD-10-CM | POA: Diagnosis not present

## 2022-05-17 DIAGNOSIS — I10 Essential (primary) hypertension: Secondary | ICD-10-CM | POA: Diagnosis not present

## 2022-05-17 DIAGNOSIS — R9431 Abnormal electrocardiogram [ECG] [EKG]: Secondary | ICD-10-CM | POA: Diagnosis not present

## 2022-05-17 DIAGNOSIS — I252 Old myocardial infarction: Secondary | ICD-10-CM | POA: Diagnosis not present

## 2022-05-17 DIAGNOSIS — F1910 Other psychoactive substance abuse, uncomplicated: Secondary | ICD-10-CM | POA: Diagnosis not present

## 2022-05-17 DIAGNOSIS — I2510 Atherosclerotic heart disease of native coronary artery without angina pectoris: Secondary | ICD-10-CM | POA: Diagnosis not present

## 2022-05-19 ENCOUNTER — Other Ambulatory Visit: Payer: Self-pay

## 2022-06-08 DIAGNOSIS — X58XXXA Exposure to other specified factors, initial encounter: Secondary | ICD-10-CM | POA: Diagnosis not present

## 2022-06-08 DIAGNOSIS — Y999 Unspecified external cause status: Secondary | ICD-10-CM | POA: Diagnosis not present

## 2022-06-08 DIAGNOSIS — M542 Cervicalgia: Secondary | ICD-10-CM | POA: Diagnosis not present

## 2022-06-08 DIAGNOSIS — R0602 Shortness of breath: Secondary | ICD-10-CM | POA: Diagnosis not present

## 2022-06-08 DIAGNOSIS — R0600 Dyspnea, unspecified: Secondary | ICD-10-CM | POA: Diagnosis not present

## 2022-06-08 DIAGNOSIS — I5022 Chronic systolic (congestive) heart failure: Secondary | ICD-10-CM | POA: Diagnosis not present

## 2022-06-08 DIAGNOSIS — S161XXA Strain of muscle, fascia and tendon at neck level, initial encounter: Secondary | ICD-10-CM | POA: Diagnosis not present

## 2022-06-08 DIAGNOSIS — I110 Hypertensive heart disease with heart failure: Secondary | ICD-10-CM | POA: Diagnosis not present

## 2022-06-08 DIAGNOSIS — J9811 Atelectasis: Secondary | ICD-10-CM | POA: Diagnosis not present

## 2022-06-08 DIAGNOSIS — M436 Torticollis: Secondary | ICD-10-CM | POA: Diagnosis not present

## 2022-06-08 DIAGNOSIS — E785 Hyperlipidemia, unspecified: Secondary | ICD-10-CM | POA: Diagnosis not present

## 2022-06-15 DIAGNOSIS — M47812 Spondylosis without myelopathy or radiculopathy, cervical region: Secondary | ICD-10-CM | POA: Diagnosis not present

## 2022-06-15 DIAGNOSIS — M542 Cervicalgia: Secondary | ICD-10-CM | POA: Diagnosis not present

## 2022-06-15 DIAGNOSIS — M7918 Myalgia, other site: Secondary | ICD-10-CM | POA: Diagnosis not present

## 2022-06-29 IMAGING — CR DG CHEST 2V
2 series · 2 of 2 positions shown · non-contrast
Comparison: 11/18/2020

CLINICAL DATA: Chest pain

EXAM:
CHEST - 2 VIEW

[chest lat]
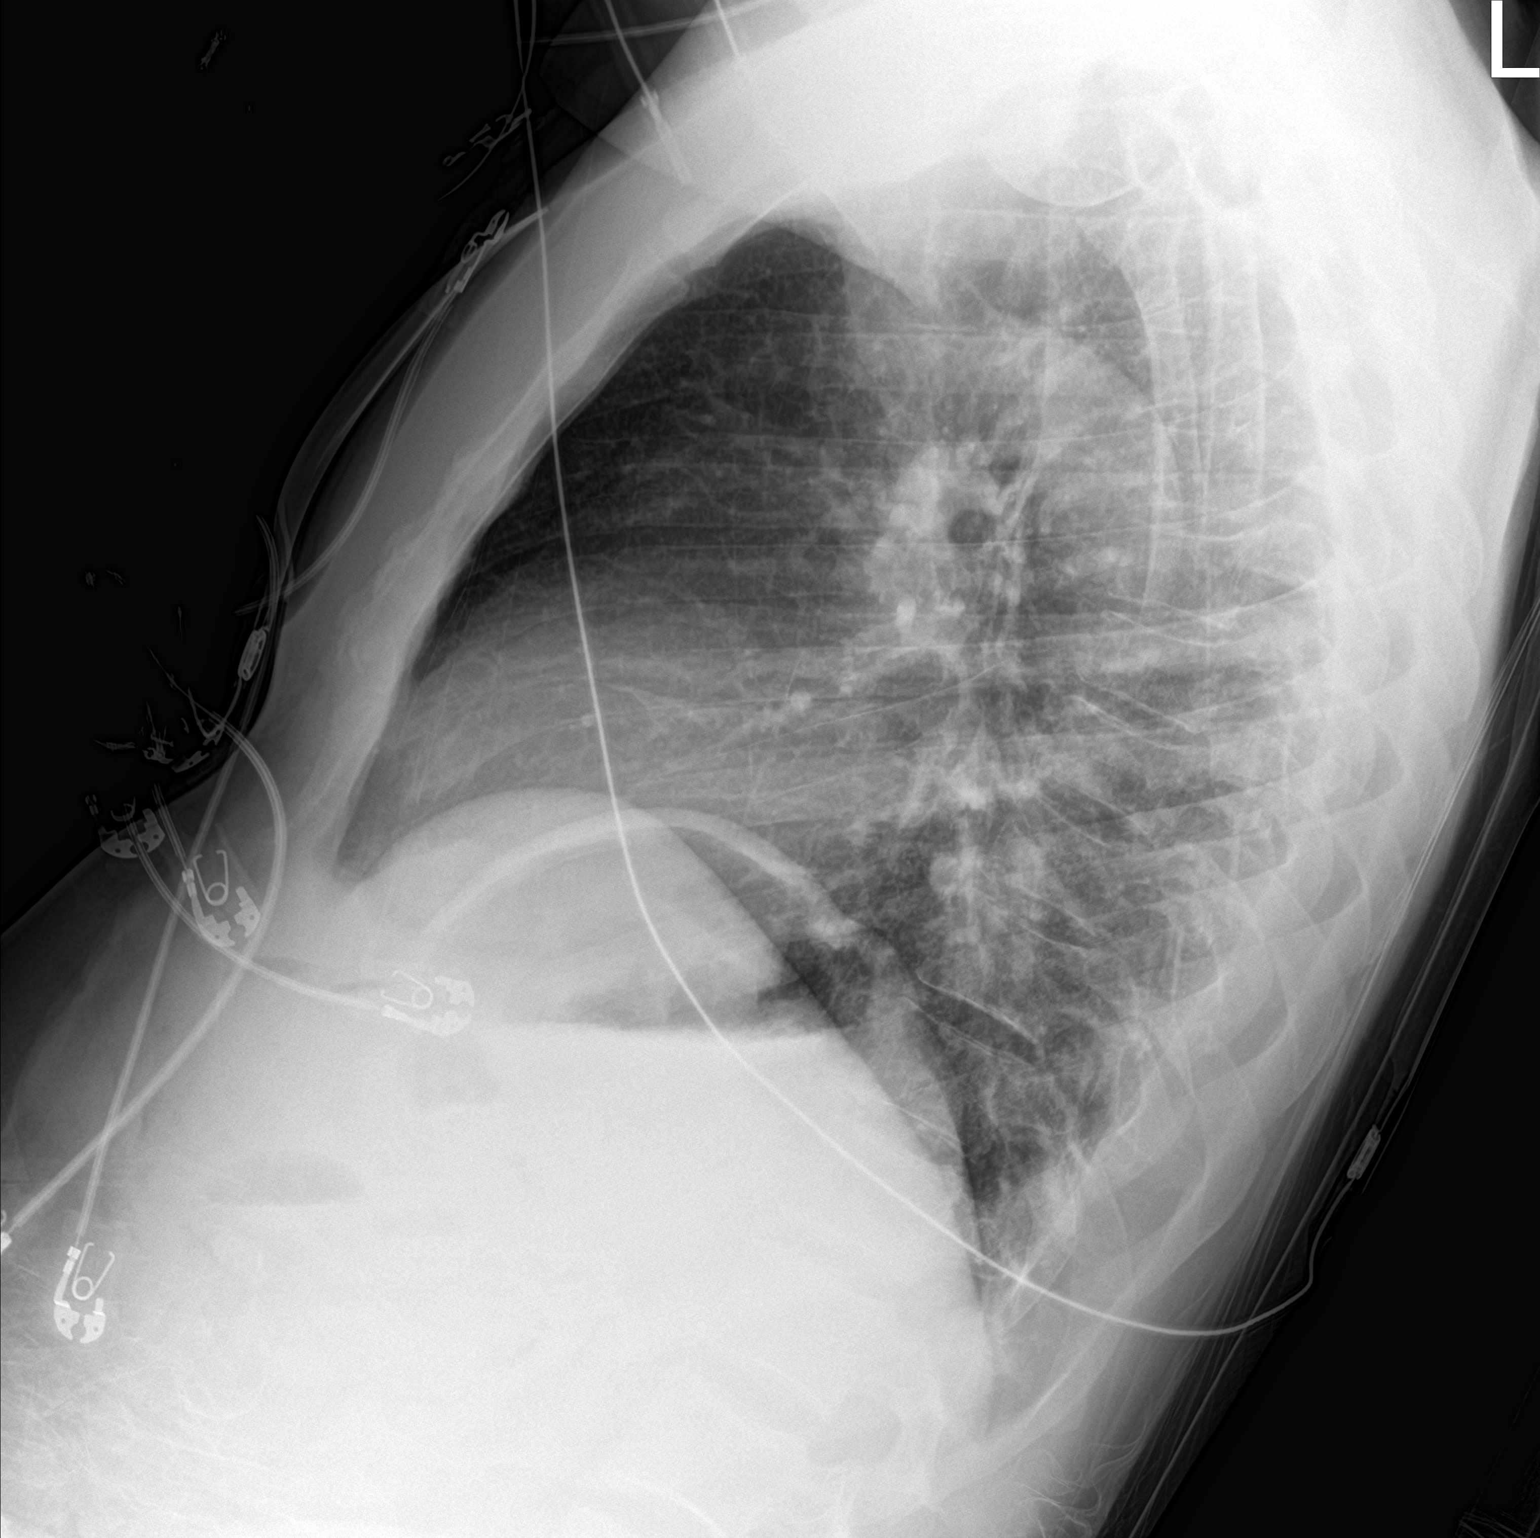

[chest ap]
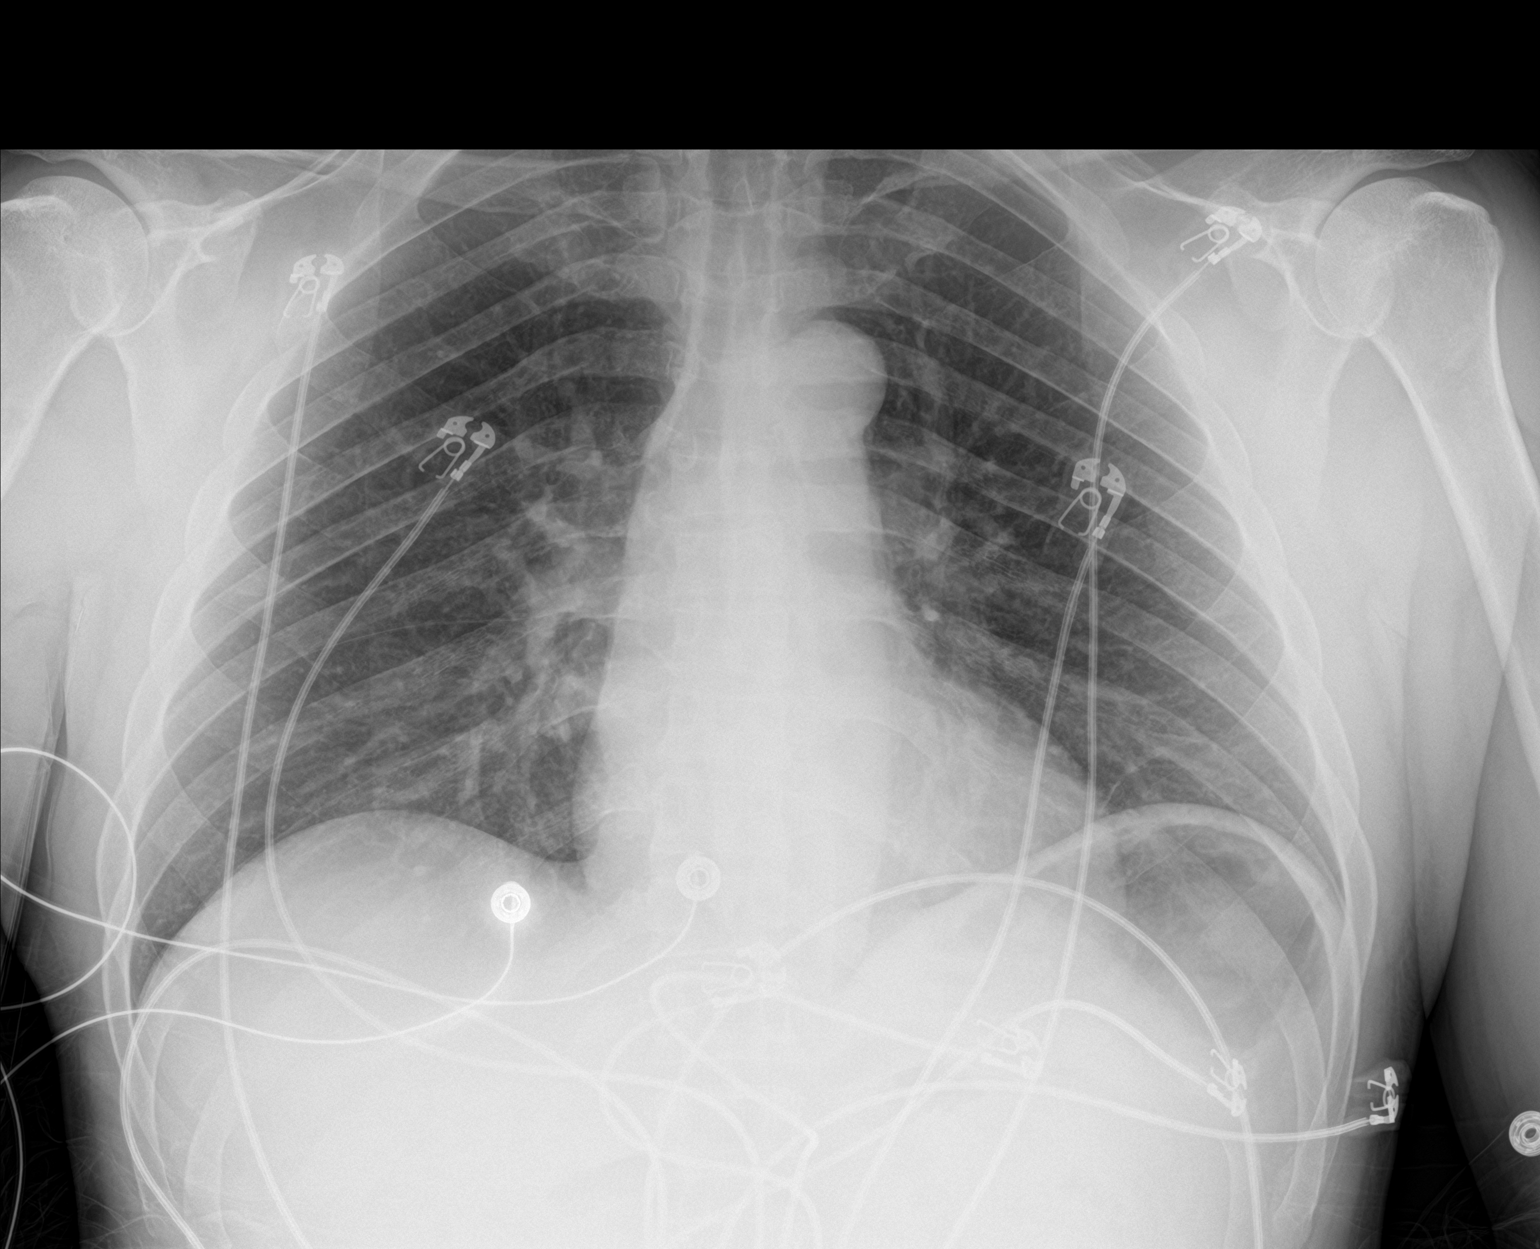

[2 of 2 positions shown; findings below may reference images not displayed]

FINDINGS: The heart size and mediastinal contours are within normal limits.
Both lungs are clear. The visualized skeletal structures are
unremarkable.
IMPRESSION: No active cardiopulmonary disease.

## 2022-06-29 IMAGING — CT CT ANGIO CHEST-ABD-PELV FOR DISSECTION W/ AND WO/W CM
2 of 7 series · 14 of 46 positions shown, 16 images · non-contrast
Comparison: CT dated 11/02/2020.

CLINICAL DATA: Abdominal pain.  Concern for aortic dissection.

EXAM:
CT ANGIOGRAPHY CHEST, ABDOMEN AND PELVIS
TECHNIQUE: Non-contrast CT of the chest was initially obtained.

[Series 8: dissection 2mm · axial · 0.86mm/px · z∈[+956,+1506]mm · 11 of 311 slices shown, 13 images]
[im 18/311  soft-tissue]
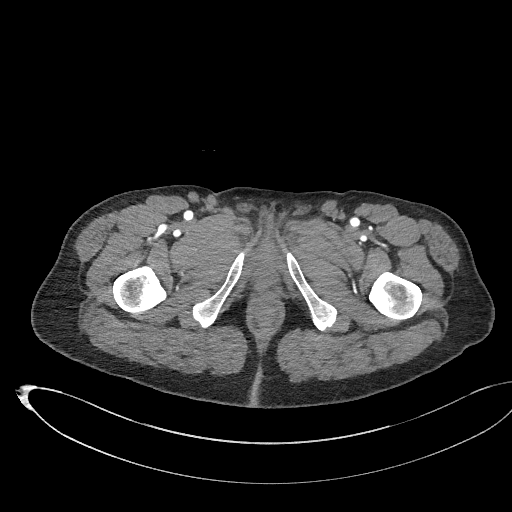
[im 18/311  bone]
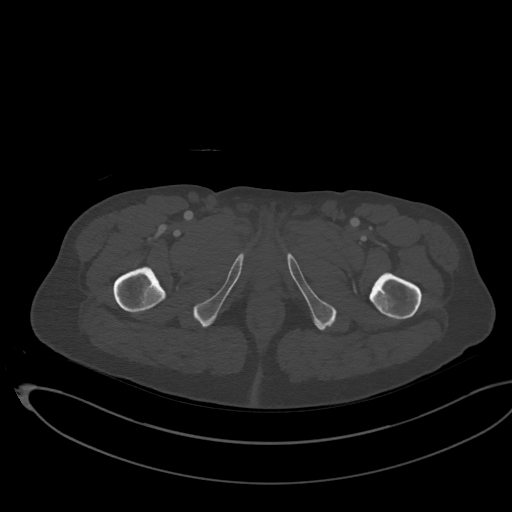
[im 52/311  soft-tissue]
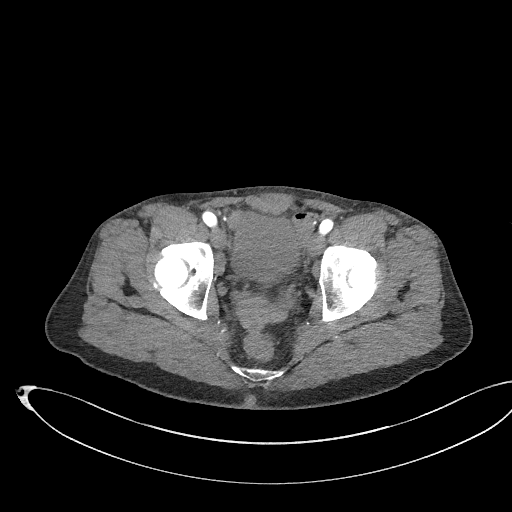
[im 69/311  soft-tissue]
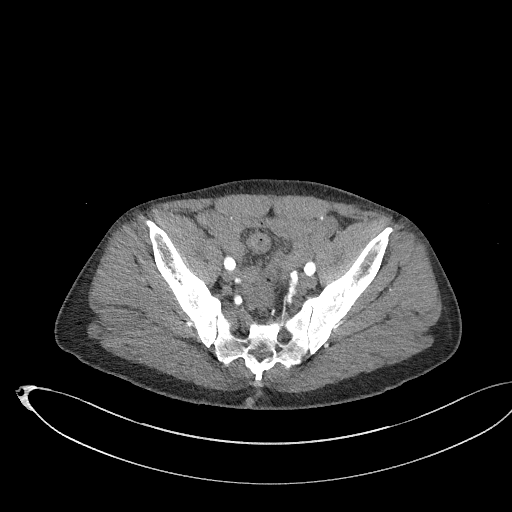
[im 104/311  soft-tissue]
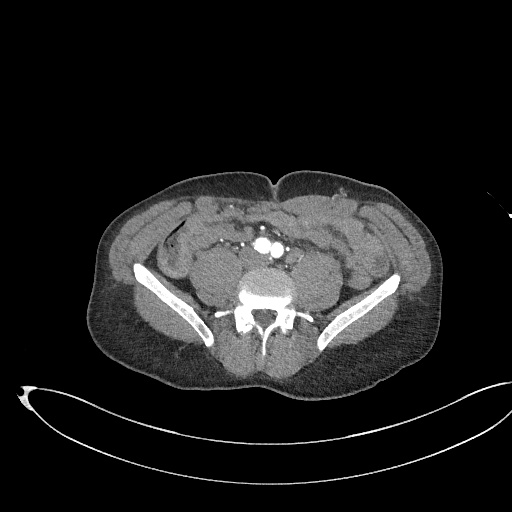
[im 121/311  soft-tissue]
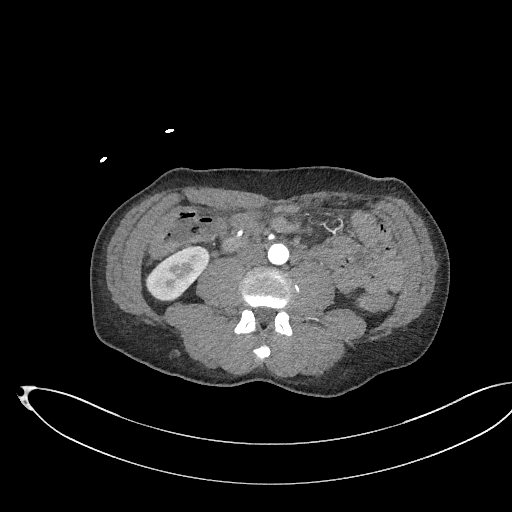
[im 156/311  soft-tissue]
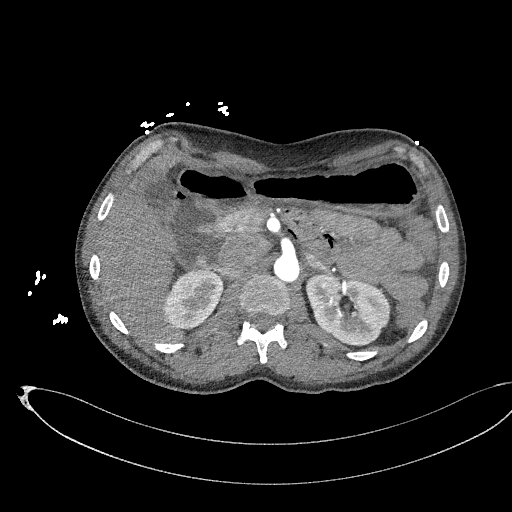
[im 190/311  soft-tissue]
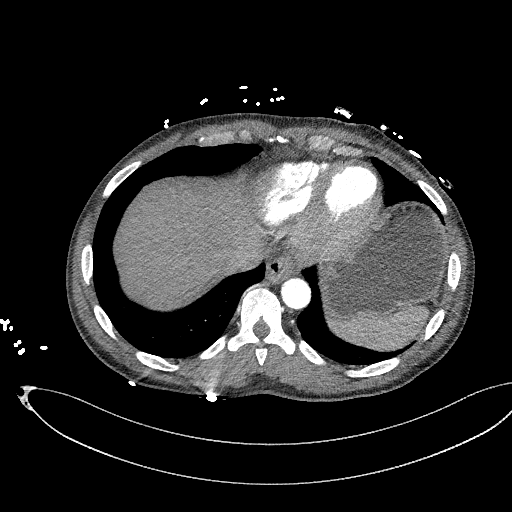
[im 207/311  soft-tissue]
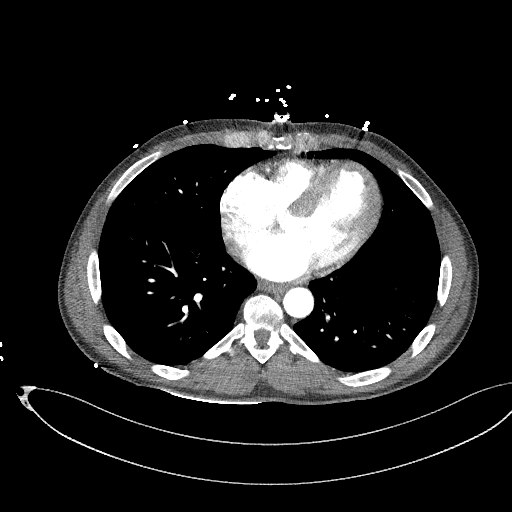
[im 242/311  soft-tissue]
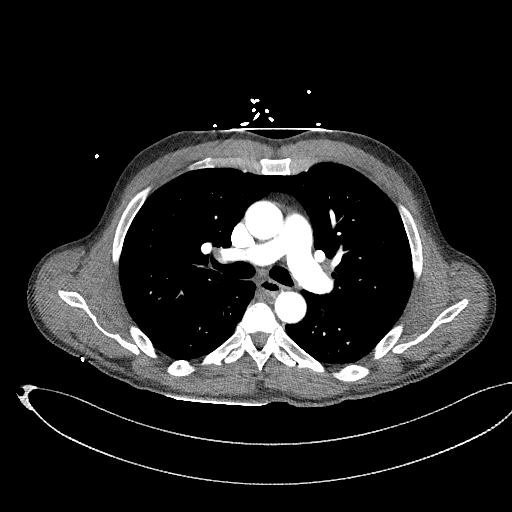
[im 242/311  bone]
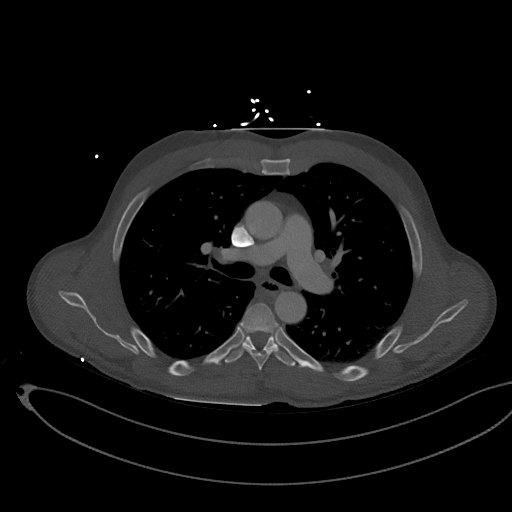
[im 259/311  soft-tissue]
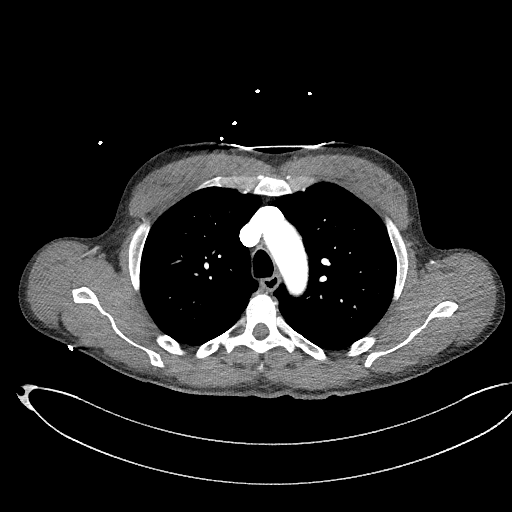
[im 293/311  soft-tissue]
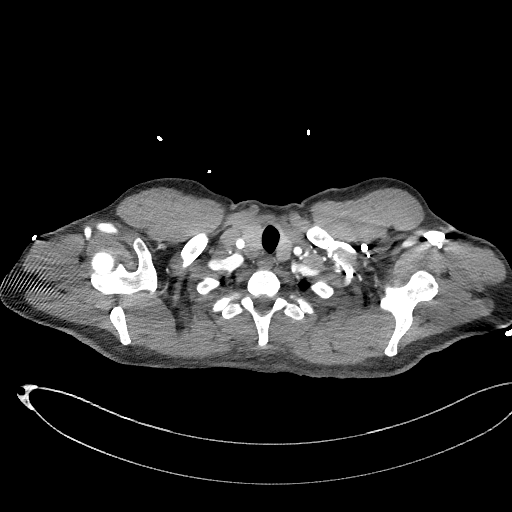

[Series 11: dissection 2mm cor · coronal · 0.81mm/px · 3 of 136 slices shown]
[im 34/136  soft-tissue]
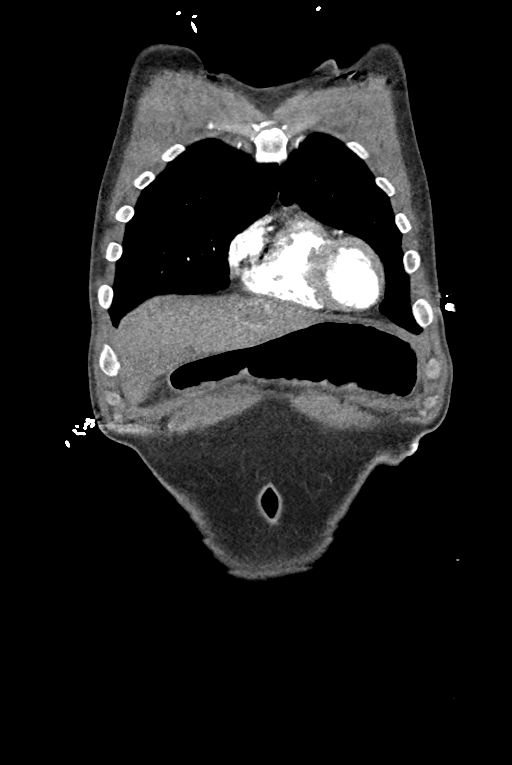
[im 68/136  soft-tissue]
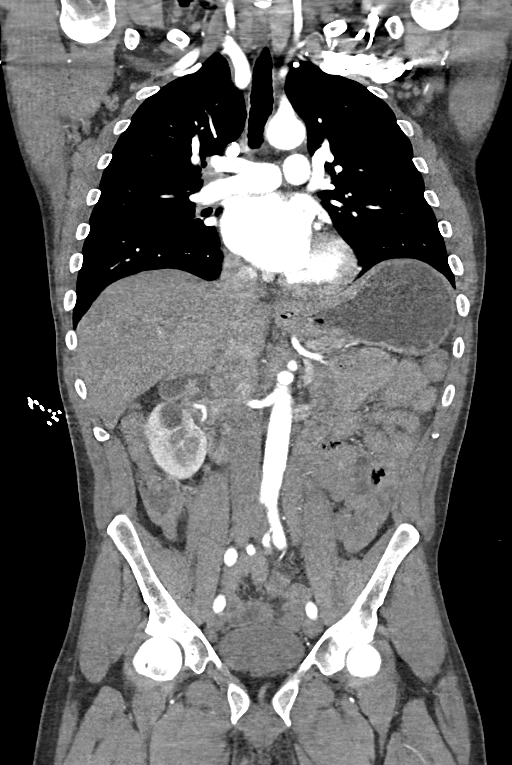
[im 102/136  soft-tissue]
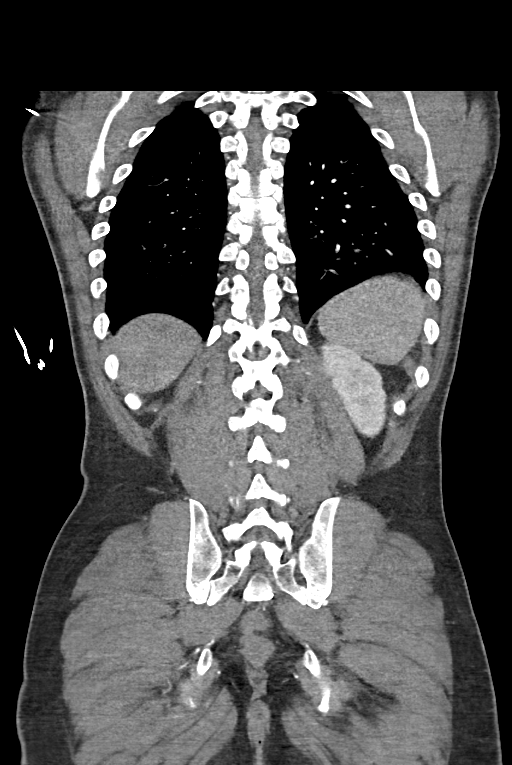

[14 of 46 positions shown; findings below may reference images not displayed]

Multidetector CT imaging through the chest, abdomen and pelvis was
performed using the standard protocol during bolus administration of
intravenous contrast. Multiplanar reconstructed images and MIPs were
obtained and reviewed to evaluate the vascular anatomy.

CONTRAST:  100mL OMNIPAQUE IOHEXOL 350 MG/ML SOLN
FINDINGS: CTA CHEST FINDINGS

Cardiovascular: There is no cardiomegaly or pericardial effusion.
There is coronary artery stent in the LAD. The thoracic aorta is
unremarkable. The origins of the great vessels of the aortic arch
appear patent. No pulmonary artery embolus identified.

Mediastinum/Nodes: No hilar or mediastinal adenopathy. The esophagus
is grossly unremarkable. No mediastinal fluid collection.

Lungs/Pleura: Paraseptal emphysema. No focal consolidation, pleural
effusion, pneumothorax. The central airways are patent.

Musculoskeletal: No acute osseous pathology.

Review of the MIP images confirms the above findings.

CTA ABDOMEN AND PELVIS FINDINGS

VASCULAR

Aorta: Normal caliber aorta without aneurysm, dissection, vasculitis
or significant stenosis.

Celiac: Patent without evidence of aneurysm, dissection, vasculitis
or significant stenosis.

SMA: Patent without evidence of aneurysm, dissection, vasculitis or
significant stenosis.

Renals: Both renal arteries are patent without evidence of aneurysm,
dissection, vasculitis, fibromuscular dysplasia or significant
stenosis.

IMA: Patent without evidence of aneurysm, dissection, vasculitis or
significant stenosis.

Inflow: Patent without evidence of aneurysm, dissection, vasculitis
or significant stenosis.

Veins: No obvious venous abnormality within the limitations of this
arterial phase study.

Review of the MIP images confirms the above findings.

NON-VASCULAR

No intra-abdominal free air or free fluid.

Hepatobiliary: Faintly visualized indeterminate 3.4 x 2.5 cm low
attenuating lesion with peripheral enhancement in the left lobe of
the liver, likely a hemangioma. This lesion however is not
characterized on this CT. MRI may provide better characterization.
No intrahepatic biliary ductal dilatation. The gallbladder is
unremarkable.

Pancreas: Unremarkable. No pancreatic ductal dilatation or
surrounding inflammatory changes.

Spleen: Normal in size without focal abnormality.

Adrenals/Urinary Tract: The adrenal glands unremarkable. There is no
hydronephrosis on either side. There is a 15 mm right renal
interpolar cyst and subcentimeter left renal inferior pole hypodense
lesion which is too small to characterize. The visualized ureters
and urinary bladder appear unremarkable.

Stomach/Bowel: There is no bowel obstruction or active inflammation.
The appendix is normal.

Lymphatic: No adenopathy.

Reproductive: The prostate and seminal vesicles are grossly
unremarkable.

Other: None

Musculoskeletal: Degenerative changes at L5-S1. No acute osseous
pathology.

Review of the MIP images confirms the above findings.
IMPRESSION: 1. No acute intrathoracic, abdominal, or pelvic pathology. No aortic
aneurysm or dissection.
2. Faintly visualized indeterminate 3.4 x 2.5 cm low attenuating
lesion with peripheral enhancement in the left lobe of the liver,
likely a hemangioma.
3. Emphysema ([id number]).

## 2022-07-08 DIAGNOSIS — I5022 Chronic systolic (congestive) heart failure: Secondary | ICD-10-CM | POA: Diagnosis not present

## 2022-07-08 DIAGNOSIS — M545 Low back pain, unspecified: Secondary | ICD-10-CM | POA: Diagnosis not present

## 2022-07-08 DIAGNOSIS — F17210 Nicotine dependence, cigarettes, uncomplicated: Secondary | ICD-10-CM | POA: Diagnosis not present

## 2022-07-08 DIAGNOSIS — S335XXA Sprain of ligaments of lumbar spine, initial encounter: Secondary | ICD-10-CM | POA: Diagnosis not present

## 2022-07-08 DIAGNOSIS — Y92410 Unspecified street and highway as the place of occurrence of the external cause: Secondary | ICD-10-CM | POA: Diagnosis not present

## 2022-07-08 DIAGNOSIS — M542 Cervicalgia: Secondary | ICD-10-CM | POA: Diagnosis not present

## 2022-07-08 DIAGNOSIS — S161XXA Strain of muscle, fascia and tendon at neck level, initial encounter: Secondary | ICD-10-CM | POA: Diagnosis not present

## 2022-07-08 DIAGNOSIS — I110 Hypertensive heart disease with heart failure: Secondary | ICD-10-CM | POA: Diagnosis not present

## 2022-09-12 DIAGNOSIS — I429 Cardiomyopathy, unspecified: Secondary | ICD-10-CM | POA: Diagnosis not present

## 2022-09-12 DIAGNOSIS — F1910 Other psychoactive substance abuse, uncomplicated: Secondary | ICD-10-CM | POA: Diagnosis not present

## 2022-09-12 DIAGNOSIS — I5022 Chronic systolic (congestive) heart failure: Secondary | ICD-10-CM | POA: Diagnosis not present

## 2022-09-12 DIAGNOSIS — E785 Hyperlipidemia, unspecified: Secondary | ICD-10-CM | POA: Diagnosis not present

## 2022-09-12 DIAGNOSIS — R55 Syncope and collapse: Secondary | ICD-10-CM | POA: Diagnosis not present

## 2022-09-12 DIAGNOSIS — I110 Hypertensive heart disease with heart failure: Secondary | ICD-10-CM | POA: Diagnosis not present

## 2022-09-12 DIAGNOSIS — I10 Essential (primary) hypertension: Secondary | ICD-10-CM | POA: Diagnosis not present

## 2022-09-12 DIAGNOSIS — D649 Anemia, unspecified: Secondary | ICD-10-CM | POA: Diagnosis not present

## 2022-09-12 DIAGNOSIS — Z720 Tobacco use: Secondary | ICD-10-CM | POA: Diagnosis not present

## 2022-09-12 DIAGNOSIS — I252 Old myocardial infarction: Secondary | ICD-10-CM | POA: Diagnosis not present

## 2022-09-12 DIAGNOSIS — I2510 Atherosclerotic heart disease of native coronary artery without angina pectoris: Secondary | ICD-10-CM | POA: Diagnosis not present

## 2022-10-20 DIAGNOSIS — E860 Dehydration: Secondary | ICD-10-CM | POA: Diagnosis not present

## 2022-10-20 DIAGNOSIS — R55 Syncope and collapse: Secondary | ICD-10-CM | POA: Diagnosis not present

## 2022-10-20 DIAGNOSIS — I959 Hypotension, unspecified: Secondary | ICD-10-CM | POA: Diagnosis not present

## 2022-10-21 DIAGNOSIS — R55 Syncope and collapse: Secondary | ICD-10-CM | POA: Diagnosis not present

## 2022-10-21 DIAGNOSIS — F1910 Other psychoactive substance abuse, uncomplicated: Secondary | ICD-10-CM | POA: Diagnosis not present

## 2022-10-21 DIAGNOSIS — E860 Dehydration: Secondary | ICD-10-CM | POA: Diagnosis not present

## 2022-10-21 DIAGNOSIS — I252 Old myocardial infarction: Secondary | ICD-10-CM | POA: Diagnosis not present

## 2022-10-21 DIAGNOSIS — I517 Cardiomegaly: Secondary | ICD-10-CM | POA: Diagnosis not present

## 2022-10-21 DIAGNOSIS — I959 Hypotension, unspecified: Secondary | ICD-10-CM | POA: Diagnosis not present

## 2022-10-21 DIAGNOSIS — E162 Hypoglycemia, unspecified: Secondary | ICD-10-CM | POA: Diagnosis not present

## 2022-10-21 DIAGNOSIS — D61818 Other pancytopenia: Secondary | ICD-10-CM | POA: Diagnosis not present

## 2022-10-21 DIAGNOSIS — I2510 Atherosclerotic heart disease of native coronary artery without angina pectoris: Secondary | ICD-10-CM | POA: Diagnosis not present

## 2022-10-21 DIAGNOSIS — G8929 Other chronic pain: Secondary | ICD-10-CM | POA: Diagnosis not present

## 2022-10-21 DIAGNOSIS — I5181 Takotsubo syndrome: Secondary | ICD-10-CM | POA: Diagnosis not present

## 2022-10-21 DIAGNOSIS — E871 Hypo-osmolality and hyponatremia: Secondary | ICD-10-CM | POA: Diagnosis not present

## 2022-10-21 DIAGNOSIS — R112 Nausea with vomiting, unspecified: Secondary | ICD-10-CM | POA: Diagnosis not present

## 2022-10-21 DIAGNOSIS — E785 Hyperlipidemia, unspecified: Secondary | ICD-10-CM | POA: Diagnosis not present

## 2022-10-21 DIAGNOSIS — E876 Hypokalemia: Secondary | ICD-10-CM | POA: Diagnosis not present

## 2022-10-21 DIAGNOSIS — I5022 Chronic systolic (congestive) heart failure: Secondary | ICD-10-CM | POA: Diagnosis not present

## 2022-11-28 DIAGNOSIS — F1120 Opioid dependence, uncomplicated: Secondary | ICD-10-CM | POA: Diagnosis not present

## 2022-12-01 DIAGNOSIS — I2510 Atherosclerotic heart disease of native coronary artery without angina pectoris: Secondary | ICD-10-CM | POA: Diagnosis not present

## 2022-12-01 DIAGNOSIS — E785 Hyperlipidemia, unspecified: Secondary | ICD-10-CM | POA: Diagnosis not present

## 2022-12-01 DIAGNOSIS — I5022 Chronic systolic (congestive) heart failure: Secondary | ICD-10-CM | POA: Diagnosis not present

## 2022-12-01 DIAGNOSIS — F1910 Other psychoactive substance abuse, uncomplicated: Secondary | ICD-10-CM | POA: Diagnosis not present

## 2022-12-01 DIAGNOSIS — Z760 Encounter for issue of repeat prescription: Secondary | ICD-10-CM | POA: Diagnosis not present

## 2022-12-01 DIAGNOSIS — Z720 Tobacco use: Secondary | ICD-10-CM | POA: Diagnosis not present

## 2022-12-01 DIAGNOSIS — Z7689 Persons encountering health services in other specified circumstances: Secondary | ICD-10-CM | POA: Diagnosis not present

## 2022-12-01 DIAGNOSIS — F17210 Nicotine dependence, cigarettes, uncomplicated: Secondary | ICD-10-CM | POA: Diagnosis not present

## 2022-12-01 DIAGNOSIS — R55 Syncope and collapse: Secondary | ICD-10-CM | POA: Diagnosis not present

## 2022-12-01 DIAGNOSIS — D759 Disease of blood and blood-forming organs, unspecified: Secondary | ICD-10-CM | POA: Diagnosis not present

## 2022-12-01 DIAGNOSIS — I10 Essential (primary) hypertension: Secondary | ICD-10-CM | POA: Diagnosis not present

## 2022-12-01 DIAGNOSIS — F32A Depression, unspecified: Secondary | ICD-10-CM | POA: Diagnosis not present

## 2022-12-01 DIAGNOSIS — D649 Anemia, unspecified: Secondary | ICD-10-CM | POA: Diagnosis not present

## 2022-12-06 DIAGNOSIS — F1120 Opioid dependence, uncomplicated: Secondary | ICD-10-CM | POA: Diagnosis not present

## 2022-12-13 DIAGNOSIS — F1120 Opioid dependence, uncomplicated: Secondary | ICD-10-CM | POA: Diagnosis not present

## 2022-12-20 DIAGNOSIS — F1120 Opioid dependence, uncomplicated: Secondary | ICD-10-CM | POA: Diagnosis not present

## 2022-12-27 DIAGNOSIS — F1120 Opioid dependence, uncomplicated: Secondary | ICD-10-CM | POA: Diagnosis not present

## 2023-01-02 DIAGNOSIS — I110 Hypertensive heart disease with heart failure: Secondary | ICD-10-CM | POA: Diagnosis not present

## 2023-01-02 DIAGNOSIS — I10 Essential (primary) hypertension: Secondary | ICD-10-CM | POA: Diagnosis not present

## 2023-01-02 DIAGNOSIS — E785 Hyperlipidemia, unspecified: Secondary | ICD-10-CM | POA: Diagnosis not present

## 2023-01-02 DIAGNOSIS — F1910 Other psychoactive substance abuse, uncomplicated: Secondary | ICD-10-CM | POA: Diagnosis not present

## 2023-01-02 DIAGNOSIS — I5020 Unspecified systolic (congestive) heart failure: Secondary | ICD-10-CM | POA: Diagnosis not present

## 2023-01-02 DIAGNOSIS — I2510 Atherosclerotic heart disease of native coronary artery without angina pectoris: Secondary | ICD-10-CM | POA: Diagnosis not present

## 2023-01-03 DIAGNOSIS — F1120 Opioid dependence, uncomplicated: Secondary | ICD-10-CM | POA: Diagnosis not present

## 2023-01-10 DIAGNOSIS — F1120 Opioid dependence, uncomplicated: Secondary | ICD-10-CM | POA: Diagnosis not present

## 2023-01-17 DIAGNOSIS — F1120 Opioid dependence, uncomplicated: Secondary | ICD-10-CM | POA: Diagnosis not present

## 2023-01-24 DIAGNOSIS — F1120 Opioid dependence, uncomplicated: Secondary | ICD-10-CM | POA: Diagnosis not present

## 2023-01-27 DIAGNOSIS — Z7689 Persons encountering health services in other specified circumstances: Secondary | ICD-10-CM | POA: Diagnosis not present

## 2023-01-31 DIAGNOSIS — F1120 Opioid dependence, uncomplicated: Secondary | ICD-10-CM | POA: Diagnosis not present

## 2023-02-02 DIAGNOSIS — Z7689 Persons encountering health services in other specified circumstances: Secondary | ICD-10-CM | POA: Diagnosis not present

## 2023-12-17 ENCOUNTER — Encounter (HOSPITAL_COMMUNITY): Payer: Self-pay

## 2023-12-17 ENCOUNTER — Ambulatory Visit (HOSPITAL_COMMUNITY)
Admission: EM | Admit: 2023-12-17 | Discharge: 2023-12-17 | Disposition: A | Discharge status: Home / Self Care | Payer: Self-pay | Attending: Emergency Medicine | Admitting: Emergency Medicine

## 2023-12-17 DIAGNOSIS — R600 Localized edema: Secondary | ICD-10-CM

## 2023-12-17 MED ORDER — FUROSEMIDE 20 MG PO TABS: 20.0000 mg | ORAL_TABLET | Freq: Every day | ORAL | 0 refills | Status: DC

## 2023-12-17 NOTE — ED Provider Notes (Signed)
 [facility]    CSN: [id number] Arrival date & time: 12/17/23  1659      History   Chief Complaint Chief Complaint  Patient presents with   Joint Swelling    HPI Christopher Gibson is a 53 y.o. male.   Patient presents with bilateral lower leg and ankle swelling x 4 to 5 days.  Patient states that he has a history of CHF and is normally prescribed furosemide  as needed for leg swelling, but states that he ran out of this and he is currently visiting here from out of town.  Denies shortness of breath, chest pain, dizziness, weakness, palpitations, difficulty walking, or pain.  Patient states that he has only been taking his Entresto  otherwise.  Patient states that he has a cardiologist back in [place]  but has not seen them in a few months.  Other past medical history includes CAD, hypertension, STEMI, hyperlipidemia, and polysubstance abuse.  The history is provided by the patient and medical records.    Past Medical History:  Diagnosis Date   CAD (coronary artery disease)    Chronic combined systolic and diastolic CHF (congestive heart failure) (HCC)    Cocaine abuse (HCC)    Heroin abuse (HCC)    HTN (hypertension)    S/P angioplasty with stent 10/13/20 DES to mLAD    STEMI (ST elevation myocardial infarction) (HCC) 10/13/2020    Patient Active Problem List   Diagnosis Date Noted   Polysubstance abuse (HCC) 07/14/2021   Tobacco dependence 07/14/2021   Hyperlipidemia 06/13/2021   ACS (acute coronary syndrome) (HCC) 06/08/2021   Hypertensive urgency 03/18/2021   CAD (coronary artery disease), native coronary artery 03/17/2021   Chest pain 03/17/2021   Cocaine abuse (HCC) 03/17/2021   Chronic combined systolic and diastolic CHF (congestive heart failure) (HCC) 03/17/2021   Heroin abuse (HCC) 03/17/2021   HTN (hypertension) 03/17/2021   Acute ST elevation myocardial infarction (STEMI) due to occlusion of distal portion of left anterior descending (LAD) coronary  artery (HCC) 10/13/2020   ST elevation myocardial infarction (STEMI) (HCC)    Coronary artery disease involving native coronary artery of native heart with unstable angina pectoris (HCC)    Acute combined systolic and diastolic HF (heart failure), NYHA class 3 (HCC)     Past Surgical History:  Procedure Laterality Date   CORONARY/GRAFT ACUTE MI REVASCULARIZATION N/A 10/13/2020   Procedure: Coronary/Graft Acute MI Revascularization;  Surgeon: Claudene Victory ORN, MD;  [facility];  Service: Cardiovascular;  Laterality: N/A;   LEFT HEART CATH AND CORONARY ANGIOGRAPHY N/A 10/13/2020   Procedure: LEFT HEART CATH AND CORONARY ANGIOGRAPHY;  Surgeon: Claudene Victory ORN, MD;  [facility];  Service: Cardiovascular;  Laterality: N/A;   LEFT HEART CATH AND CORONARY ANGIOGRAPHY N/A 06/08/2021   Procedure: LEFT HEART CATH AND CORONARY ANGIOGRAPHY;  Surgeon: Wendel Lurena POUR, MD;  [facility];  Service: Cardiovascular;  Laterality: N/A;       Home Medications    Prior to Admission medications   Medication Sig Start Date End Date Taking? Authorizing Provider  furosemide  (LASIX ) 20 MG tablet Take 1 tablet (20 mg total) by mouth daily for 5 days. 12/17/23 12/22/23 Yes Irva Loser A, NP  aspirin  81 MG EC tablet Take 1 tablet (81 mg total) by mouth daily. Patient not taking: Reported on 12/17/2023 07/14/21   Vicci Barnie NOVAK, MD  cloNIDine  (CATAPRES ) 0.1 MG tablet Take 1 tablet by mouth  4 times daily  today, then 2 times daily  tomorrow, then one time daily on the third day, then stop Patient not taking: Reported on 12/17/2023 09/01/21   Haze Lonni PARAS, MD  clopidogrel  (PLAVIX ) 75 MG tablet Take 1 tablet (75 mg total) by mouth daily. Patient not taking: Reported on 12/17/2023 07/14/21   Vicci Barnie NOVAK, MD  empagliflozin  (JARDIANCE ) 10 MG TABS tablet Take 1 tablet (10 mg total) by mouth daily. 07/14/21   Vicci Barnie NOVAK, MD  nicotine  (NICODERM CQ  - DOSED IN  MG/24 HOURS) 14 mg/24hr patch Place 1 patch (14 mg total) onto the skin daily. For the 2nd month Patient not taking: Reported on 07/14/2021 06/23/21   Swinyer, Rosaline HERO, NP  nitroGLYCERIN  (NITROSTAT ) 0.4 MG SL tablet Place 1 tablet (0.4 mg total) under the tongue every 5 (five) minutes as needed for chest pain (CP or SOB). Patient not taking: Reported on 12/17/2023 06/13/21   Henry Shaver B, NP  rosuvastatin  (CRESTOR ) 40 MG tablet Take 1 tablet (40 mg total) by mouth daily. Patient not taking: Reported on 12/17/2023 07/14/21   Vicci Barnie NOVAK, MD  sacubitril -valsartan  (ENTRESTO ) 24-26 MG Take 1 tablet by mouth 2 (two) times daily. 07/14/21   Vicci Barnie NOVAK, MD    Family History Family History  Problem Relation Age of Onset   Coronary artery disease Neg Hx     Social History Social History   Tobacco Use   Smoking status: Every Day   Smokeless tobacco: Never  Vaping Use   Vaping status: Never Used  Substance Use Topics   Alcohol use: Not Currently   Drug use: Not Currently    Types: Cocaine, Heroin     Allergies   Keflex [cephalexin]   Review of Systems Review of Systems  Per HPI  Physical Exam Triage Vital Signs ED Triage Vitals  Encounter Vitals Group     BP 12/17/23 1802 (!) 159/85     Girls Systolic BP Percentile --      Girls Diastolic BP Percentile --      Boys Systolic BP Percentile --      Boys Diastolic BP Percentile --      Pulse Rate 12/17/23 1802 82     Resp 12/17/23 1802 18     Temp 12/17/23 1802 98.1 F (36.7 C)     Temp Source 12/17/23 1802 Oral     SpO2 12/17/23 1802 96 %     Weight --      Height --      Head Circumference --      Peak Flow --      Pain Score 12/17/23 1803 0     Pain Loc --      Pain Education --      Exclude from Growth Chart --    No data found.  Updated Vital Signs BP (!) 159/85 (BP Location: Left Arm)   Pulse 82   Temp 98.1 F (36.7 C) (Oral)   Resp 18   SpO2 96%   Visual Acuity Right Eye Distance:   Left  Eye Distance:   Bilateral Distance:    Right Eye Near:   Left Eye Near:    Bilateral Near:     Physical Exam Vitals and nursing note reviewed.  Constitutional:      General: He is awake. He is not in acute distress.    Appearance: Normal appearance. He is well-developed and well-groomed. He is not ill-appearing.  Cardiovascular:     Rate and Rhythm: Normal  rate and regular rhythm.     Pulses: Normal pulses.          Popliteal pulses are 2+ on the right side and 2+ on the left side.       Dorsalis pedis pulses are 2+ on the right side and 2+ on the left side.       Posterior tibial pulses are 2+ on the right side and 2+ on the left side.     Heart sounds: Normal heart sounds.  Pulmonary:     Effort: Pulmonary effort is normal.     Breath sounds: Normal breath sounds.  Musculoskeletal:     Right lower leg: 1+ Edema present.     Left lower leg: 1+ Edema present.  Skin:    General: Skin is warm and dry.  Neurological:     Mental Status: He is alert.  Psychiatric:        Behavior: Behavior is cooperative.      UC Treatments / Results  Labs (all labs ordered are listed, but only abnormal results are displayed) Labs Reviewed - No data to display  EKG   Radiology No results found.  Procedures Procedures (including critical care time)  Medications Ordered in UC Medications - No data to display  Initial Impression / Assessment and Plan / UC Course  I have reviewed the triage vital signs and the nursing notes.  Pertinent labs & imaging results that were available during my care of the patient were reviewed by me and considered in my medical decision making (see chart for details).     Patient is overall well-appearing.  Vitals are stable.  Very mild nonpitting edema noted to bilateral ankles and lower legs.  Pulses present.  Heart and lung sounds normal.  No other significant findings on exam.  Prescribed patient a few doses of furosemide  to take over the next few  days to assist with peripheral edema.  Recommended calling cardiologist or primary care provider during follow-up appointment with them regarding new onset of leg swelling.  Discussed follow-up, return, and strict ER precautions. Final Clinical Impressions(s) / UC Diagnoses   Final diagnoses:  Peripheral edema     Discharge Instructions      Start taking 1 tablet of furosemide  once daily over the next 5 days to help with leg swelling. Call your cardiologist or your primary care provider first thing tomorrow morning to schedule a follow-up appointment with them for further evaluation of this. If you develop increased leg swelling, shortness of breath, chest pain, palpitations, dizziness, or weakness please seek immediate medical treatment in the emergency department.   ED Prescriptions     Medication Sig Dispense Auth. Provider   furosemide  (LASIX ) 20 MG tablet Take 1 tablet (20 mg total) by mouth daily for 5 days. 5 tablet Johnie Flaming A, NP      PDMP not reviewed this encounter.   Johnie Flaming A, NP 12/17/23 [phone]

## 2023-12-17 NOTE — Discharge Instructions (Signed)
 Start taking 1 tablet of furosemide  once daily over the next 5 days to help with leg swelling. Call your cardiologist or your primary care provider first thing tomorrow morning to schedule a follow-up appointment with them for further evaluation of this. If you develop increased leg swelling, shortness of breath, chest pain, palpitations, dizziness, or weakness please seek immediate medical treatment in the emergency department.

## 2023-12-17 NOTE — ED Triage Notes (Signed)
 Pt c/o bilateral ankle/feet swelling x4-5 days. States hasn't taken his fluid pill in over a month. States is from out of town.

## 2024-02-26 ENCOUNTER — Telehealth (HOSPITAL_COMMUNITY): Payer: Self-pay | Admitting: Cardiology

## 2024-02-26 NOTE — Telephone Encounter (Signed)
 Called to confirm/remind patient of their appointment at the [facility] on 02/26/24.   Appointment:   [x] Confirmed  [] Left mess   [] No answer/No voice mail  [] VM Full/unable to leave message  [] Phone not in service  Patient reminded to bring all medications and/or complete list.  Confirmed patient has transportation. Gave directions, instructed to utilize valet parking.

## 2024-02-27 ENCOUNTER — Ambulatory Visit (HOSPITAL_COMMUNITY)
Admission: RE | Admit: 2024-02-27 | Discharge: 2024-02-27 | Disposition: A | Discharge status: Home / Self Care | Payer: Self-pay | Source: Ambulatory Visit | Attending: Cardiology | Admitting: Cardiology

## 2024-02-27 ENCOUNTER — Other Ambulatory Visit (HOSPITAL_COMMUNITY): Payer: Self-pay

## 2024-02-27 ENCOUNTER — Encounter (HOSPITAL_COMMUNITY): Payer: Self-pay | Admitting: Cardiology

## 2024-02-27 VITALS — BP 118/68 | HR 81 | Ht 66.0 in | Wt 187.2 lb

## 2024-02-27 DIAGNOSIS — I2510 Atherosclerotic heart disease of native coronary artery without angina pectoris: Secondary | ICD-10-CM | POA: Diagnosis present

## 2024-02-27 DIAGNOSIS — I5042 Chronic combined systolic (congestive) and diastolic (congestive) heart failure: Secondary | ICD-10-CM

## 2024-02-27 DIAGNOSIS — I252 Old myocardial infarction: Secondary | ICD-10-CM | POA: Insufficient documentation

## 2024-02-27 DIAGNOSIS — Z79899 Other long term (current) drug therapy: Secondary | ICD-10-CM | POA: Diagnosis not present

## 2024-02-27 DIAGNOSIS — F1410 Cocaine abuse, uncomplicated: Secondary | ICD-10-CM | POA: Insufficient documentation

## 2024-02-27 DIAGNOSIS — Z8674 Personal history of sudden cardiac arrest: Secondary | ICD-10-CM | POA: Insufficient documentation

## 2024-02-27 DIAGNOSIS — Z955 Presence of coronary angioplasty implant and graft: Secondary | ICD-10-CM | POA: Insufficient documentation

## 2024-02-27 DIAGNOSIS — I5022 Chronic systolic (congestive) heart failure: Secondary | ICD-10-CM | POA: Diagnosis present

## 2024-02-27 DIAGNOSIS — Z7902 Long term (current) use of antithrombotics/antiplatelets: Secondary | ICD-10-CM | POA: Insufficient documentation

## 2024-02-27 MED ORDER — NITROGLYCERIN 0.4 MG SL SUBL
0.4000 mg | SUBLINGUAL_TABLET | SUBLINGUAL | 2 refills | Status: AC | PRN
  Filled 2024-02-27: qty 25, 15d supply, fill #0

## 2024-02-27 MED ORDER — CARVEDILOL 3.125 MG PO TABS
3.1250 mg | ORAL_TABLET | Freq: Two times a day (BID) | ORAL | 3 refills | Status: DC
  Filled 2024-02-27: qty 180, 90d supply, fill #0

## 2024-02-27 NOTE — Progress Notes (Signed)
   [facility] NEW PATIENT CLINIC NOTE  Referring Physician: Vicci Barnie NOVAK, MD  Primary Care: Vicci Barnie NOVAK, MD Primary Cardiologist:  HPI: Christopher Gibson is a 53 y.o. male with a PMH of polysubstance use, CAD, HFrEF who presents for initial visit for further evaluation and treatment of heart failure/cardiomyopathy.      Patient with a PMH of significant drug use. Seen in [facility] 10/2020 for a late presenting anterior MI with moderate atherosclerotic burden. Also noted to haven an LV thrombus. He was seen back again in 04-05-2022 after chest pain for cocaine use, LAD lesion CTO at that time and unable to be crossed.  Subsequently moved to [place] , and had what appeared to be a sudden cardiac death episode in April 05, 2024 in the setting of active drug use. Lifevest was placed at that time, has moved back to [place] to be near family.     SUBJECTIVE:  Patient sitting on his phone playing a game when I walked into the room. When asked about it, he turned the sound off and continued playing throughout the visit and through the nurse check out at then end. Answered with one word answers, when asked why he was here he stated it was to get an ICD. Had taken his Lifevest off. We attempted to discuss his severe LV dysfunction, but was not interested.   PMH, current medications, allergies, social history, and family history reviewed in epic.  PHYSICAL EXAM: Vitals:   02/27/24 1429  BP: 118/68  Pulse: 81  SpO2: 97%   GENERAL: Well nourished and in no apparent distress at rest.  PULM:  Normal work of breathing, clear to auscultation bilaterally. Respirations are unlabored.  CARDIAC:  JVP: flat         Normal rate with regular rhythm. No murmurs, rubs or gallops.  No edema. Warm and well perfused extremities. ABDOMEN: Soft, non-tender, non-distended. NEUROLOGIC: Patient is oriented x3 with no focal or lateralizing neurologic deficits.    DATA REVIEW  ECG: 02/27/2024:  Anterior/inferior infarct pattern, normal sinus    ECHO: Noted previously to be 15-20% post arrest in 08/2023, previously 25%  CATH: 06/10/2021: CTO mid LAD, moderate residual disease     ASSESSMENT & PLAN:  Systolic heart failure: Due to CAD and likely substance abuse as well. Reports that he has been taking his medications, but as noted above was just on his phone the whole visit. Reasonable to start low dose BB as he appears fairly well compensated, carvedilol  given history of drug use. - Start carvedilol  3.125mg  BID - Stop clonidine  - Continue entresto  24/26mg  BID - Continue jardiance  10mg  daily - Potentially MRA at next visit - Echo ordered for baseline  Sudden cardiac death: Both with prior drug use and known LAD infarct and suspected large scar burden. Not interested in Lifevest. Will provide referral to EP as he meets criteria for primary prevention, but difficult candidate - EP referral, BB above  CAD: Prior LAD stent, now CTO. - Continue plavix  75mg  daily, crestor  40mg  daily  Follow up in 3 months  [name], MD [facility] Mechanical Circulatory Support 03/02/24

## 2024-02-27 NOTE — Patient Instructions (Signed)
 START Carvedilol  3.125 mg Twice daily  Your physician has requested that you have an echocardiogram. Echocardiography is a painless test that uses sound waves to create images of your heart. It provides your doctor with information about the size and shape of your heart and how well your heart's chambers and valves are working. This procedure takes approximately one hour. There are no restrictions for this procedure. Please do NOT wear cologne, perfume, aftershave, or lotions (deodorant is allowed). Please arrive 15 minutes prior to your appointment time.  Please note: We ask at that you not bring children with you during ultrasound (echo/ vascular) testing. Due to room size and safety concerns, children are not allowed in the ultrasound rooms during exams. Our front office staff cannot provide observation of children in our lobby area while testing is being conducted. An adult accompanying a patient to their appointment will only be allowed in the ultrasound room at the discretion of the ultrasound technician under special circumstances. We apologize for any inconvenience.   You have been referred to the Electrophysiologist. They will call you to arrange your appointment.  Your physician recommends that you schedule a follow-up appointment in: 3 months ( January 2026) ** PLEASE CALL THE OFFICE IN DECEMBER TO ARRANGE YOUR FOLLOW UP APPOINTMENT.**  If you have any questions or concerns before your next appointment please send us  a message through [place] or call our office at [phone].    TO LEAVE A MESSAGE FOR THE NURSE SELECT OPTION 2, PLEASE LEAVE A MESSAGE INCLUDING: YOUR NAME DATE OF BIRTH CALL BACK NUMBER REASON FOR CALL**this is important as we prioritize the call backs  YOU WILL RECEIVE A CALL BACK THE SAME DAY AS LONG AS YOU CALL BEFORE 4:00 PM  At the [facility], you and your health needs are our priority. As part of our continuing mission to provide you with  exceptional heart care, we have created designated Provider Care Teams. These Care Teams include your primary Cardiologist (physician) and Advanced Practice Providers (APPs- Physician Assistants and Nurse Practitioners) who all work together to provide you with the care you need, when you need it.   You may see any of the following providers on your designated Care Team at your next follow up: Dr Toribio Fuel Dr Ezra Shuck Dr. Ria Commander Dr. [name] Amy Lenetta, NP Caffie Shed, [place] [facility] [place], [place] Beckey Coe, NP [place] Lee, NP Ellouise Class, NP Tinnie Redman, PharmD Jaun Bash, PharmD   Please be sure to bring in all your medications bottles to every appointment.    Thank you for choosing [facility]

## 2024-03-10 ENCOUNTER — Other Ambulatory Visit (HOSPITAL_COMMUNITY): Payer: Self-pay

## 2024-05-12 ENCOUNTER — Other Ambulatory Visit: Payer: Self-pay

## 2024-05-12 ENCOUNTER — Encounter (HOSPITAL_BASED_OUTPATIENT_CLINIC_OR_DEPARTMENT_OTHER): Payer: Self-pay

## 2024-05-12 ENCOUNTER — Emergency Department (HOSPITAL_BASED_OUTPATIENT_CLINIC_OR_DEPARTMENT_OTHER): Admitting: Radiology

## 2024-05-12 DIAGNOSIS — Z7902 Long term (current) use of antithrombotics/antiplatelets: Secondary | ICD-10-CM | POA: Insufficient documentation

## 2024-05-12 DIAGNOSIS — I2510 Atherosclerotic heart disease of native coronary artery without angina pectoris: Secondary | ICD-10-CM

## 2024-05-12 DIAGNOSIS — Z7982 Long term (current) use of aspirin: Secondary | ICD-10-CM | POA: Insufficient documentation

## 2024-05-12 DIAGNOSIS — R0602 Shortness of breath: Secondary | ICD-10-CM | POA: Diagnosis present

## 2024-05-12 DIAGNOSIS — M545 Low back pain, unspecified: Secondary | ICD-10-CM | POA: Diagnosis not present

## 2024-05-12 DIAGNOSIS — I5042 Chronic combined systolic (congestive) and diastolic (congestive) heart failure: Secondary | ICD-10-CM | POA: Diagnosis not present

## 2024-05-12 LAB — CBC
HCT: 36.2 % — ABNORMAL LOW (ref 39.0–52.0)
Hemoglobin: 11.9 g/dL — ABNORMAL LOW (ref 13.0–17.0)
MCH: 29.9 pg (ref 26.0–34.0)
MCHC: 32.9 g/dL (ref 30.0–36.0)
MCV: 91 fL (ref 80.0–100.0)
Platelets: 217 K/uL (ref 150–400)
RBC: 3.98 MIL/uL — ABNORMAL LOW (ref 4.22–5.81)
RDW: 12.7 % (ref 11.5–15.5)
WBC: 5.7 K/uL (ref 4.0–10.5)
nRBC: 0 % (ref 0.0–0.2)

## 2024-05-12 LAB — BASIC METABOLIC PANEL WITH GFR
Anion gap: 11 (ref 5–15)
BUN: 18 mg/dL (ref 6–20)
CO2: 24 mmol/L (ref 22–32)
Calcium: 9.4 mg/dL (ref 8.9–10.3)
Chloride: 100 mmol/L (ref 98–111)
Creatinine, Ser: 0.86 mg/dL (ref 0.61–1.24)
GFR, Estimated: >60 mL/min
Glucose, Bld: 112 mg/dL — ABNORMAL HIGH (ref 70–99)
Potassium: 4 mmol/L (ref 3.5–5.1)
Sodium: 135 mmol/L (ref 135–145)

## 2024-05-12 LAB — PRO BRAIN NATRIURETIC PEPTIDE: Pro Brain Natriuretic Peptide: 1137 pg/mL — ABNORMAL HIGH

## 2024-05-12 MED ORDER — CARVEDILOL 3.125 MG PO TABS: 3.1250 mg | ORAL_TABLET | Freq: Two times a day (BID) | ORAL | 3 refills | Status: AC

## 2024-05-12 MED ORDER — FUROSEMIDE 20 MG PO TABS: 20.0000 mg | ORAL_TABLET | Freq: Every day | ORAL | 0 refills | Status: AC

## 2024-05-12 MED ORDER — METHOCARBAMOL 500 MG PO TABS: 500.0000 mg | ORAL_TABLET | Freq: Two times a day (BID) | ORAL | 0 refills | Status: AC

## 2024-05-12 MED ORDER — CLOPIDOGREL BISULFATE 75 MG PO TABS: 75.0000 mg | ORAL_TABLET | Freq: Every day | ORAL | 1 refills | Status: AC

## 2024-05-12 MED ORDER — LIDOCAINE 5 % EX PTCH: 1.0000 | MEDICATED_PATCH | CUTANEOUS | 0 refills | Status: AC

## 2024-05-12 MED ORDER — SACUBITRIL-VALSARTAN 24-26 MG PO TABS: 1.0000 | ORAL_TABLET | Freq: Two times a day (BID) | ORAL | 4 refills | Status: AC

## 2024-05-12 NOTE — ED Triage Notes (Signed)
 Lower right back pain into abd and left leg. Short of breath. Denies cough, congestion.  Has Cardiac LifeVest x 1 year. Denies urinary symptoms. -N/-V/-D.

## 2024-05-12 NOTE — ED Provider Notes (Signed)
 "  [facility]  Provider Note  CSN: [id number] Arrival date & time: 05/12/24 1922  History Chief Complaint  Patient presents with   Shortness of Breath    Christopher Gibson is a 54 y.o. male with history of ischemic cardiomyopathy, low EF was last at Cardiologist in October to discuss AICD for low EF, has been wearing a Lifevest in the meantime, scheduled for AICD later this month. He is here primarily for L lumbar back pain, radiating into hip, worse with movement. No falls or injuries. No incontinence or fever. No radicular symptoms, numbness or weakness. He also reports occasional feeling SOB and some ankle puffiness. He has been out of his heart medications for over a month.    Home Medications Prior to Admission medications  Medication Sig Start Date End Date Taking? Authorizing Provider  lidocaine  (LIDODERM ) 5 % Place 1 patch onto the skin daily. Remove & Discard patch within 12 hours or as directed by MD 05/12/24  Yes Roselyn Carlin NOVAK, MD  methocarbamol  (ROBAXIN ) 500 MG tablet Take 1 tablet (500 mg total) by mouth 2 (two) times daily. 05/12/24  Yes Roselyn Carlin NOVAK, MD  aspirin  81 MG EC tablet Take 1 tablet (81 mg total) by mouth daily. Patient not taking: Reported on 02/27/2024 07/14/21   Vicci Barnie NOVAK, MD  carvedilol  (COREG ) 3.125 MG tablet Take 1 tablet (3.125 mg total) by mouth 2 (two) times daily. 05/12/24 08/10/24  Roselyn Carlin NOVAK, MD  cloNIDine  (CATAPRES ) 0.1 MG tablet Take 1 tablet by mouth  4 times daily  today, then 2 times daily  tomorrow, then one time daily on the third day, then stop Patient not taking: Reported on 02/27/2024 09/01/21   Haze Lonni JINNY, MD  clopidogrel  (PLAVIX ) 75 MG tablet Take 1 tablet (75 mg total) by mouth daily. 05/12/24   Roselyn Carlin NOVAK, MD  empagliflozin  (JARDIANCE ) 10 MG TABS tablet Take 1 tablet (10 mg total) by mouth daily. 07/14/21   Vicci Barnie NOVAK, MD  furosemide  (LASIX ) 20 MG tablet Take 1  tablet (20 mg total) by mouth daily. 05/12/24 05/17/24  Roselyn Carlin NOVAK, MD  nicotine  (NICODERM CQ  - DOSED IN MG/24 HOURS) 14 mg/24hr patch Place 1 patch (14 mg total) onto the skin daily. For the 2nd month Patient not taking: Reported on 02/27/2024 06/23/21   Swinyer, Rosaline HERO, NP  nitroGLYCERIN  (NITROSTAT ) 0.4 MG SL tablet Place 1 tablet (0.4 mg total) under the tongue every 5 (five) minutes as needed for chest pain or shortness of breath. 02/27/24   Zenaida Morene JINNY, MD  rosuvastatin  (CRESTOR ) 40 MG tablet Take 1 tablet (40 mg total) by mouth daily. 07/14/21   Vicci Barnie NOVAK, MD  sacubitril -valsartan  (ENTRESTO ) 24-26 MG Take 1 tablet by mouth 2 (two) times daily. 05/12/24   Roselyn Carlin NOVAK, MD     Allergies    Keflex [cephalexin]   Review of Systems   Review of Systems Please see HPI for pertinent positives and negatives  Physical Exam BP (!) 136/106 (BP Location: Right Arm)   Pulse 77   Temp 97.8 F (36.6 C)   Resp 18   Ht 5' 6 (1.676 m)   Wt 84.9 kg   SpO2 99%   BMI 30.21 kg/m   Physical Exam Vitals and nursing note reviewed.  Constitutional:      Appearance: Normal appearance.  HENT:     Head: Normocephalic and atraumatic.     Nose: Nose normal.  Mouth/Throat:     Mouth: Mucous membranes are moist.  Eyes:     Extraocular Movements: Extraocular movements intact.     Conjunctiva/sclera: Conjunctivae normal.  Cardiovascular:     Rate and Rhythm: Normal rate.  Pulmonary:     Effort: Pulmonary effort is normal.     Breath sounds: Normal breath sounds.  Abdominal:     General: Abdomen is flat.     Palpations: Abdomen is soft.     Tenderness: There is no abdominal tenderness.  Musculoskeletal:        General: No swelling. Normal range of motion.     Cervical back: Neck supple.     Right lower leg: No edema.     Left lower leg: No edema.     Comments: L lumbar paraspinal muscle tenderness  Skin:    General: Skin is warm and dry.  Neurological:      General: No focal deficit present.     Mental Status: He is alert.     Comments: No leg numbness or weakness  Psychiatric:        Mood and Affect: Mood normal.     ED Results / Procedures / Treatments   EKG EKG Interpretation Date/Time:  Monday May 12 2024 19:30:45 EST Ventricular Rate:  77 PR Interval:  180 QRS Duration:  102 QT Interval:  378 QTC Calculation: 427 R Axis:   254  Text Interpretation: Normal sinus rhythm Possible Right ventricular hypertrophy Inferior infarct (cited on or before 09-Jun-2021) Anterolateral infarct (cited on or before 09-Jun-2021) No significant change since last tracing When compared with ECG of 27-Feb-2024 14:27, No significant change was found Confirmed by Doretha Folks ([id number]) on 05/12/2024 7:33:25 PM  Procedures Procedures  Medications Ordered in the ED Medications - No data to display  Initial Impression and Plan  Patient with a complex cardiac history primarily here for MSK low back pain without red flags or signs of radiculopathy. Will give Rx for Robaxin , can also use heat, APAP, and lidocaine  patches.  With regards to his mild SOB, he is in no distress, no exam signs of fluid overload. Labs done in triage show unremarkable CBC, BMP. BNP is elevated, no priors for comparison. I personally viewed the images from radiology studies and agree with radiologist interpretation: CXR is clear, no pulm edema. Patient advised to continue wearing his LifeVest until follow up. Refill of his cardiac meds tonight, including his diuretic. PCP follow up, RTED for any other concerns.    ED Course       MDM Rules/Calculators/A&P Medical Decision Making Problems Addressed: Acute left-sided low back pain without sciatica: acute illness or injury Chronic combined systolic and diastolic CHF (congestive heart failure) (HCC): chronic illness or injury with exacerbation, progression, or side effects of treatment  Amount and/or Complexity of Data  Reviewed Labs: ordered. Decision-making details documented in ED Course. Radiology: ordered and independent interpretation performed. Decision-making details documented in ED Course. ECG/medicine tests: ordered and independent interpretation performed. Decision-making details documented in ED Course.  Risk OTC drugs. Prescription drug management.     Final Clinical Impression(s) / ED Diagnoses Final diagnoses:  Acute left-sided low back pain without sciatica  Chronic combined systolic and diastolic CHF (congestive heart failure) (HCC)    Rx / DC Orders ED Discharge Orders          Ordered    carvedilol  (COREG ) 3.125 MG tablet  2 times daily        05/12/24 2351  clopidogrel  (PLAVIX ) 75 MG tablet  Daily        05/12/24 2351    furosemide  (LASIX ) 20 MG tablet  Daily        05/12/24 2351    sacubitril -valsartan  (ENTRESTO ) 24-26 MG  2 times daily        05/12/24 2351    methocarbamol  (ROBAXIN ) 500 MG tablet  2 times daily        05/12/24 2351    lidocaine  (LIDODERM ) 5 %  Every 24 hours        05/12/24 2351             Roselyn Carlin NOVAK, MD 05/12/24 2351  "

## 2024-05-13 MED ORDER — METHOCARBAMOL 500 MG PO TABS
500.0000 mg | ORAL_TABLET | Freq: Once | ORAL | Status: AC
  Administered 2024-05-13: 500 mg via ORAL
  Filled 2024-05-13: qty 1

## 2024-05-20 NOTE — Progress Notes (Unsigned)
 " Electrophysiology Office Note:   Date:  05/21/2024  ID:  Christopher Gibson, DOB Jun 14, 1970, MRN [id number]  Primary Cardiologist: Victory LELON Claudene DOUGLAS, MD (Inactive) Primary Heart Failure: None Electrophysiologist: None      History of Present Illness:   Christopher Gibson is a 54 y.o. male with h/o polysubstance abuse, coronary disease, chronic systolic heart failure seen today for  for Electrophysiology evaluation of chronic systolic heart failure at the request of Christopher Gibson.    He presented to the hospital in June 2022 for late presenting anterior MI.  He was noted to have LV thrombus at the time.  He was seen again in 2023 with chest pain and was positive for cocaine abuse.  LAD lesion was a CTO at that time and was unable to be crossed.  He subsequently moved to [place]  and had appeared to be cardiac death episode 25 setting of active drug use.  A LifeVest was placed and he has been back to [place] to be closer to family.  Discussed the use of AI scribe software for clinical note transcription with the patient, who gave verbal consent to proceed.  History of Present Illness Christopher Gibson is a 54 year old male with a history of myocardial infarction and stent placement who presents for evaluation of a defibrillator placement. He was referred by Dr. Brownie for evaluation of a defibrillator due to a history of heart attack and abnormal heart rhythm.  He has a history of myocardial infarction and subsequent stent placement in 2022. Since then, he has generally been doing well except for an episode of syncope approximately one year ago in [place] , where he passed out and stopped breathing for about two minutes. He was provided with a LifeVest at that time, which he has been using as a precautionary measure against potential heart rhythm abnormalities.  He recalls being told that his heart function was around 27% following the heart attack.  He confirms adherence to his medication regimen,  although he did experience a lapse in medication for about one to two weeks before recently obtaining refills. No issues with his current medications.   Review of systems complete and found to be negative unless listed in HPI.   EP Information / Studies Reviewed:    EKG is not ordered today. EKG from 05/13/2023 reviewed which showed sinus rhythm, anterior and inferior infarct        Risk Assessment/Calculations:            Physical Exam:   VS:  BP 110/70 (BP Location: Right Arm, Patient Position: Sitting, Cuff Size: Large)   Pulse 64   Ht 5' 6 (1.676 m)   Wt 187 lb 9.6 oz (85.1 kg)   SpO2 95%   BMI 30.28 kg/m    Wt Readings from Last 3 Encounters:  05/21/24 187 lb 9.6 oz (85.1 kg)  05/12/24 187 lb 2.7 oz (84.9 kg)  02/27/24 187 lb 3.2 oz (84.9 kg)     GEN: Well nourished, well developed in no acute distress NECK: No JVD; No carotid bruits CARDIAC: Regular rate and rhythm, no murmurs, rubs, gallops RESPIRATORY:  Clear to auscultation without rales, wheezing or rhonchi  ABDOMEN: Soft, non-tender, non-distended EXTREMITIES:  No edema; No deformity   ASSESSMENT AND PLAN:    1.  Chronic systolic heart failure: Due to coronary artery disease and ischemic cardiomyopathy.  Likely also due to polysubstance abuse.  Patient has a history of medication noncompliance due to drugs.  This  is a difficult situation as it appears that his arrest was related to potential drug use.  He does have an ischemic cardiomyopathy and does have risk factors for sudden death.  I Christopher Gibson discuss this further with his heart failure cardiologist.  Christopher Gibson also contact the outside facility in [place]  to obtain records from his cardiac arrest.  2. Sudden cardiac death: occurred in the setting of drug abuse with severe ischemic cardiomyopathy.   3. CAD: CTO of the LAD. Plan per primary cardiology  Follow up with EP Team pending records   Signed, Christopher Mruk [name], MD  "

## 2024-05-21 ENCOUNTER — Ambulatory Visit: Attending: Cardiology | Admitting: Cardiology

## 2024-05-21 ENCOUNTER — Encounter: Payer: Self-pay | Admitting: Cardiology

## 2024-05-21 VITALS — BP 110/70 | HR 64 | Ht 66.0 in | Wt 187.6 lb

## 2024-05-21 DIAGNOSIS — I2510 Atherosclerotic heart disease of native coronary artery without angina pectoris: Secondary | ICD-10-CM | POA: Diagnosis not present

## 2024-05-21 DIAGNOSIS — I5022 Chronic systolic (congestive) heart failure: Secondary | ICD-10-CM

## 2024-05-27 ENCOUNTER — Emergency Department (HOSPITAL_COMMUNITY)

## 2024-05-27 ENCOUNTER — Emergency Department (HOSPITAL_COMMUNITY)
Admission: EM | Admit: 2024-05-27 | Discharge: 2024-05-27 | Disposition: A | Discharge status: Home / Self Care | Attending: Emergency Medicine | Admitting: Emergency Medicine

## 2024-05-27 ENCOUNTER — Other Ambulatory Visit: Payer: Self-pay

## 2024-05-27 DIAGNOSIS — F1193 Opioid use, unspecified with withdrawal: Secondary | ICD-10-CM | POA: Diagnosis not present

## 2024-05-27 DIAGNOSIS — R112 Nausea with vomiting, unspecified: Secondary | ICD-10-CM | POA: Diagnosis present

## 2024-05-27 DIAGNOSIS — Z7982 Long term (current) use of aspirin: Secondary | ICD-10-CM | POA: Insufficient documentation

## 2024-05-27 DIAGNOSIS — Z7902 Long term (current) use of antithrombotics/antiplatelets: Secondary | ICD-10-CM | POA: Diagnosis not present

## 2024-05-27 LAB — CBC WITH DIFFERENTIAL/PLATELET
Abs Immature Granulocytes: 0.01 K/uL (ref 0.00–0.07)
Basophils Absolute: 0 K/uL (ref 0.0–0.1)
Basophils Relative: 1 %
Eosinophils Absolute: 0 K/uL (ref 0.0–0.5)
Eosinophils Relative: 0 %
HCT: 45.9 % (ref 39.0–52.0)
Hemoglobin: 15.2 g/dL (ref 13.0–17.0)
Immature Granulocytes: 0 %
Lymphocytes Relative: 27 %
Lymphs Abs: 1.2 K/uL (ref 0.7–4.0)
MCH: 30.1 pg (ref 26.0–34.0)
MCHC: 33.1 g/dL (ref 30.0–36.0)
MCV: 90.9 fL (ref 80.0–100.0)
Monocytes Absolute: 0.4 K/uL (ref 0.1–1.0)
Monocytes Relative: 9 %
Neutro Abs: 2.9 K/uL (ref 1.7–7.7)
Neutrophils Relative %: 63 %
Platelets: 299 K/uL (ref 150–400)
RBC: 5.05 MIL/uL (ref 4.22–5.81)
RDW: 12.3 % (ref 11.5–15.5)
WBC: 4.6 K/uL (ref 4.0–10.5)
nRBC: 0 % (ref 0.0–0.2)

## 2024-05-27 LAB — COMPREHENSIVE METABOLIC PANEL WITH GFR
ALT: 8 U/L (ref 0–44)
AST: 20 U/L (ref 15–41)
Albumin: 4.4 g/dL (ref 3.5–5.0)
Alkaline Phosphatase: 97 U/L (ref 38–126)
Anion gap: 17 — ABNORMAL HIGH (ref 5–15)
BUN: 16 mg/dL (ref 6–20)
CO2: 22 mmol/L (ref 22–32)
Calcium: 10.3 mg/dL (ref 8.9–10.3)
Chloride: 99 mmol/L (ref 98–111)
Creatinine, Ser: 0.93 mg/dL (ref 0.61–1.24)
GFR, Estimated: >60 mL/min
Glucose, Bld: 106 mg/dL — ABNORMAL HIGH (ref 70–99)
Potassium: 3.7 mmol/L (ref 3.5–5.1)
Sodium: 138 mmol/L (ref 135–145)
Total Bilirubin: 0.7 mg/dL (ref 0.0–1.2)
Total Protein: 9.2 g/dL — ABNORMAL HIGH (ref 6.5–8.1)

## 2024-05-27 LAB — TROPONIN T, HIGH SENSITIVITY: Troponin T High Sensitivity: 14 ng/L (ref 0–19)

## 2024-05-27 LAB — MAGNESIUM: Magnesium: 1.9 mg/dL (ref 1.7–2.4)

## 2024-05-27 MED ORDER — NALOXONE HCL 4 MG/0.1ML NA LIQD: 1.0000 | NASAL | 0 refills | Status: AC | PRN

## 2024-05-27 MED ORDER — BUPRENORPHINE HCL-NALOXONE HCL 2-0.5 MG SL SUBL
2.0000 | SUBLINGUAL_TABLET | SUBLINGUAL | Status: DC | PRN
  Administered 2024-05-27: 2 via SUBLINGUAL
  Filled 2024-05-27 (×2): qty 2

## 2024-05-27 MED ORDER — ONDANSETRON HCL 4 MG PO TABS: 4.0000 mg | ORAL_TABLET | Freq: Three times a day (TID) | ORAL | 1 refills | Status: AC | PRN

## 2024-05-27 MED ORDER — ONDANSETRON HCL 4 MG/2ML IJ SOLN
4.0000 mg | Freq: Once | INTRAMUSCULAR | Status: AC
  Administered 2024-05-27: 4 mg via INTRAVENOUS
  Filled 2024-05-27: qty 2

## 2024-05-27 MED ORDER — ONDANSETRON 4 MG PO TBDP
4.0000 mg | ORAL_TABLET | Freq: Once | ORAL | Status: AC
  Administered 2024-05-27: 4 mg via ORAL
  Filled 2024-05-27: qty 1

## 2024-05-27 MED ORDER — KETOROLAC TROMETHAMINE 30 MG/ML IJ SOLN
15.0000 mg | Freq: Once | INTRAMUSCULAR | Status: AC
  Administered 2024-05-27: 15 mg via INTRAVENOUS
  Filled 2024-05-27: qty 1

## 2024-05-27 MED ORDER — SODIUM CHLORIDE 0.9 % IV BOLUS
1000.0000 mL | Freq: Once | INTRAVENOUS | Status: AC
  Administered 2024-05-27: 1000 mL via INTRAVENOUS

## 2024-05-27 MED ORDER — BUPRENORPHINE HCL-NALOXONE HCL 8-2 MG SL FILM: 2.0000 | ORAL_FILM | Freq: Three times a day (TID) | SUBLINGUAL | 0 refills | Status: AC | PRN

## 2024-05-27 NOTE — TOC CM/SW Note (Signed)
 TOC consult received for substance abuse. Follow-up to be completed with patient as appropriate.    Merilee Batty, MSN, RN Case Management [phone]

## 2024-05-27 NOTE — Progress Notes (Signed)
 TOC consult received for SUD resource navigation. Per EDP, plans to rx Suboxone  on discharge. CSW met with patient at bedside. States he has only ever tried MAT in the form of Methadone. Brief education provided on other forms of MAT. We discussed the relationship between his substance use behavior and recent medical concerns, he states his health and safety are strong motivations for treatment engagement. Patient provided local SUD resource list, reviewed local MAT providers. [facility] is an OP clinic with a Suboxone  program that is a 5 min drive away from patient's house, he was encouraged strongly to establish with them or a different MAT provider asap. We reviewed [facility] location, hours of operation, and how to get established there. We reviewed patient's interest in more involved treatment modalities, IOP vs. IP. Patient stated he would think about it. Patient encouraged, reminded that information for higher levels of care is also included on the resource list. Patient provided with ginger ale, without further needs/questions at the time of our conversation.

## 2024-05-27 NOTE — ED Triage Notes (Signed)
 Pt states he is shob and has been vomiting since yesterday. He thinks he is withdrawing from percocets. He took the opioids for 6 days and then has not had them in 2 days.

## 2024-05-27 NOTE — ED Provider Notes (Signed)
 " [place] [facility] AT [facility] Provider Note   CSN: [id number] Arrival date & time: 05/27/24  1859     Patient presents with: Shortness of Breath, Emesis, and Withdrawal   Christopher Gibson is a 54 y.o. male.  Who presents to the ED for opioid withdrawal.  Patient frequently uses p.o. opioids such as Percocet and intranasal fentanyl .  No IV drugs.  Last use was 2 days ago.  Feels as though he is withdrawing from opioids with symptoms of shortness of breath, body aches nausea vomiting general malaise and rhinorrhea.  Denies other drug or alcohol use.    Shortness of Breath Associated symptoms: vomiting   Emesis      Prior to Admission medications  Medication Sig Start Date End Date Taking? Authorizing Provider  Buprenorphine  HCl-Naloxone  HCl 8-2 MG FILM Place 2 Film under the tongue every 8 (eight) hours as needed for up to 5 days (withdrawal symptoms). 05/27/24 06/01/24 Yes Pamella Ozell LABOR, DO  naloxone  (NARCAN ) nasal spray 4 mg/0.1 mL Place 1 spray into the nose as needed (overdose). 05/27/24  Yes Pamella Ozell LABOR, DO  ondansetron  (ZOFRAN ) 4 MG tablet Take 1 tablet (4 mg total) by mouth every 8 (eight) hours as needed for up to 5 days for nausea or vomiting. 05/27/24 06/01/24 Yes Pamella Ozell LABOR, DO  aspirin  81 MG EC tablet Take 1 tablet (81 mg total) by mouth daily. Patient not taking: Reported on 05/21/2024 07/14/21   Vicci Barnie NOVAK, MD  carvedilol  (COREG ) 3.125 MG tablet Take 1 tablet (3.125 mg total) by mouth 2 (two) times daily. 05/12/24 08/10/24  Roselyn Carlin NOVAK, MD  cloNIDine  (CATAPRES ) 0.1 MG tablet Take 1 tablet by mouth  4 times daily  today, then 2 times daily  tomorrow, then one time daily on the third day, then stop Patient not taking: Reported on 05/21/2024 09/01/21   Haze Lonni JINNY, MD  clopidogrel  (PLAVIX ) 75 MG tablet Take 1 tablet (75 mg total) by mouth daily. 05/12/24   Roselyn Carlin NOVAK, MD  empagliflozin  (JARDIANCE ) 10 MG TABS tablet  Take 1 tablet (10 mg total) by mouth daily. 07/14/21   Vicci Barnie NOVAK, MD  furosemide  (LASIX ) 20 MG tablet Take 1 tablet (20 mg total) by mouth daily. 05/12/24 05/21/24  Roselyn Carlin NOVAK, MD  lidocaine  (LIDODERM ) 5 % Place 1 patch onto the skin daily. Remove & Discard patch within 12 hours or as directed by MD 05/12/24   Roselyn Carlin NOVAK, MD  methocarbamol  (ROBAXIN ) 500 MG tablet Take 1 tablet (500 mg total) by mouth 2 (two) times daily. 05/12/24   Roselyn Carlin NOVAK, MD  nicotine  (NICODERM CQ  - DOSED IN MG/24 HOURS) 14 mg/24hr patch Place 1 patch (14 mg total) onto the skin daily. For the 2nd month Patient not taking: Reported on 05/21/2024 06/23/21   Swinyer, Rosaline HERO, NP  nitroGLYCERIN  (NITROSTAT ) 0.4 MG SL tablet Place 1 tablet (0.4 mg total) under the tongue every 5 (five) minutes as needed for chest pain or shortness of breath. 02/27/24   Zenaida Morene JINNY, MD  rosuvastatin  (CRESTOR ) 40 MG tablet Take 1 tablet (40 mg total) by mouth daily. 07/14/21   Vicci Barnie NOVAK, MD  sacubitril -valsartan  (ENTRESTO ) 24-26 MG Take 1 tablet by mouth 2 (two) times daily. 05/12/24   Roselyn Carlin NOVAK, MD    Allergies: Keflex [cephalexin]    Review of Systems  Respiratory:  Positive for shortness of breath.   Gastrointestinal:  Positive for vomiting.  Updated Vital Signs BP (!) 158/87   Pulse 63   Temp 99.2 F (37.3 C)   Resp 15   SpO2 100%   Physical Exam Vitals and nursing note reviewed.  Constitutional:      General: He is in acute distress.     Appearance: He is diaphoretic.  HENT:     Head: Normocephalic and atraumatic.  Eyes:     Pupils: Pupils are equal, round, and reactive to light.  Cardiovascular:     Rate and Rhythm: Normal rate and regular rhythm.  Pulmonary:     Effort: Pulmonary effort is normal.     Breath sounds: Normal breath sounds.  Abdominal:     Palpations: Abdomen is soft.     Tenderness: There is no abdominal tenderness.  Skin:    General: Skin is warm.      Comments: Diaphoretic   Neurological:     General: No focal deficit present.     Mental Status: He is alert.  Psychiatric:        Mood and Affect: Mood normal.     (all labs ordered are listed, but only abnormal results are displayed) Labs Reviewed  COMPREHENSIVE METABOLIC PANEL WITH GFR - Abnormal; Notable for the following components:      Result Value   Glucose, Bld 106 (*)    Total Protein 9.2 (*)    Anion gap 17 (*)    All other components within normal limits  CBC WITH DIFFERENTIAL/PLATELET  MAGNESIUM  TROPONIN T, HIGH SENSITIVITY    EKG: None  Radiology: DG Chest Portable 1 View Result Date: 05/27/2024 EXAM: 1 VIEW XRAY OF THE CHEST 05/27/2024 08:11:00 PM COMPARISON: 05/12/2024 CLINICAL HISTORY: SOB FINDINGS: LUNGS AND PLEURA: No focal pulmonary opacity. No pleural effusion. No pneumothorax. HEART AND MEDIASTINUM: No acute abnormality of the cardiac and mediastinal silhouettes. BONES AND SOFT TISSUES: No acute osseous abnormality. IMPRESSION: 1. No acute process. Electronically signed by: Pinkie Pebbles MD 05/27/2024 08:24 PM EST RP Workstation: [id number]     Procedures   Medications Ordered in the ED  buprenorphine -naloxone  (SUBOXONE ) 2-0.5 mg per SL tablet 2 tablet (2 tablets Sublingual Given 05/27/24 2132)  ondansetron  (ZOFRAN -ODT) disintegrating tablet 4 mg (4 mg Oral Given 05/27/24 1920)  sodium chloride  0.9 % bolus 1,000 mL (1,000 mLs Intravenous New Bag/Given 05/27/24 2031)  ketorolac  (TORADOL ) 30 MG/ML injection 15 mg (15 mg Intravenous Given 05/27/24 2032)  ondansetron  (ZOFRAN ) injection 4 mg (4 mg Intravenous Given 05/27/24 2032)    Clinical Course as of 05/27/24 2253  Tue May 27, 2024  2246 Patient reports improvement in his withdrawal symptoms after buprenorphine  IV fluids Zofran  Toradol .  Will discharge with buprenorphine  prescription and Zofran  prescription as well.  Ryan from our social work team was kind enough to meet with the patient and discussed  outpatient resources for outpatient counseling of opioid use disorder. [MP]    Clinical Course User Index [MP] Pamella Ozell LABOR, DO                                 Medical Decision Making 54 year old male presenting for acute opioid withdrawal.  Last use 2 days ago.  Symptoms of malaise body aches nausea vomiting shortness of breath.  Consistent with opioid withdrawal however will obtain EKG high sensitive troponin chest x-ray cardiac workup.  Will plan on starting him with buprenorphine  and reaching out to our social work team for outpatient resources.  Amount and/or Complexity of Data Reviewed Labs: ordered. Radiology: ordered.  Risk Prescription drug management.        Final diagnoses:  Opioid withdrawal [facility])    ED Discharge Orders          Ordered    Buprenorphine  HCl-Naloxone  HCl 8-2 MG FILM  Every 8 hours PRN        05/27/24 2251    ondansetron  (ZOFRAN ) 4 MG tablet  Every 8 hours PRN        05/27/24 2251    naloxone  (NARCAN ) nasal spray 4 mg/0.1 mL  As needed        05/27/24 2252               Pamella Ozell LABOR, DO 05/27/24 2253  "

## 2024-05-27 NOTE — Discharge Instructions (Addendum)
 You were seen in the Emergency Department for opioid withdrawal We gave you your first dose of Suboxone  here and have called in a prescription for Suboxone  to your pharmacy Use this medication as directed Follow-up with the outpatient resources as discussed with Bernardino tonight for substance abuse counseling We have also called in a prescription for Zofran  for you to pick up in your pharmacy to help with the nausea and vomiting We have also called a prescription for Narcan  in the event of future overdoses on yourself or someone you know Return to the Emergency Department for severe withdrawal symptoms or opioid overdose
